# Patient Record
Sex: Female | Born: 1945 | Race: White | Hispanic: No | Smoking: Never smoker
Health system: Southern US, Community
[De-identification: ages and names within clinical notes are randomized; demographics above are authoritative.]

## PROBLEM LIST (undated history)

## (undated) DIAGNOSIS — J302 Other seasonal allergic rhinitis: Secondary | ICD-10-CM

## (undated) DIAGNOSIS — F419 Anxiety disorder, unspecified: Secondary | ICD-10-CM

## (undated) DIAGNOSIS — R011 Cardiac murmur, unspecified: Secondary | ICD-10-CM

## (undated) DIAGNOSIS — E785 Hyperlipidemia, unspecified: Secondary | ICD-10-CM

## (undated) DIAGNOSIS — F329 Major depressive disorder, single episode, unspecified: Secondary | ICD-10-CM

## (undated) DIAGNOSIS — I4510 Unspecified right bundle-branch block: Secondary | ICD-10-CM

## (undated) DIAGNOSIS — C4490 Unspecified malignant neoplasm of skin, unspecified: Secondary | ICD-10-CM

## (undated) DIAGNOSIS — F32A Depression, unspecified: Secondary | ICD-10-CM

## (undated) DIAGNOSIS — C50919 Malignant neoplasm of unspecified site of unspecified female breast: Secondary | ICD-10-CM

## (undated) DIAGNOSIS — Q249 Congenital malformation of heart, unspecified: Secondary | ICD-10-CM

## (undated) DIAGNOSIS — M1990 Unspecified osteoarthritis, unspecified site: Secondary | ICD-10-CM

## (undated) HISTORY — DX: Hyperlipidemia, unspecified: E78.5

## (undated) HISTORY — DX: Unspecified osteoarthritis, unspecified site: M19.90

## (undated) HISTORY — DX: Other seasonal allergic rhinitis: J30.2

## (undated) HISTORY — DX: Depression, unspecified: F32.A

## (undated) HISTORY — DX: Anxiety disorder, unspecified: F41.9

## (undated) HISTORY — DX: Congenital malformation of heart, unspecified: Q24.9

## (undated) HISTORY — DX: Cardiac murmur, unspecified: R01.1

## (undated) HISTORY — PX: BREAST EXCISIONAL BIOPSY: SUR124

## (undated) HISTORY — DX: Major depressive disorder, single episode, unspecified: F32.9

## (undated) HISTORY — DX: Malignant neoplasm of unspecified site of unspecified female breast: C50.919

## (undated) HISTORY — PX: TUBAL LIGATION: SHX77

## (undated) HISTORY — PX: TOOTH EXTRACTION: SUR596

## (undated) HISTORY — DX: Unspecified malignant neoplasm of skin, unspecified: C44.90

## (undated) HISTORY — PX: SKIN SURGERY: SHX2413

---

## 1999-09-27 ENCOUNTER — Encounter (INDEPENDENT_AMBULATORY_CARE_PROVIDER_SITE_OTHER): Payer: Self-pay

## 1999-09-27 ENCOUNTER — Other Ambulatory Visit: Admission: RE | Admit: 1999-09-27 | Discharge: 1999-09-27 | Payer: Self-pay | Admitting: Obstetrics and Gynecology

## 2000-01-24 ENCOUNTER — Encounter: Payer: Self-pay | Admitting: Obstetrics and Gynecology

## 2000-01-24 ENCOUNTER — Ambulatory Visit (HOSPITAL_COMMUNITY): Admission: RE | Admit: 2000-01-24 | Discharge: 2000-01-24 | Payer: Self-pay | Admitting: Obstetrics and Gynecology

## 2000-06-10 ENCOUNTER — Encounter: Payer: Self-pay | Admitting: Family Medicine

## 2000-06-10 ENCOUNTER — Encounter: Admission: RE | Admit: 2000-06-10 | Discharge: 2000-06-10 | Payer: Self-pay | Admitting: Family Medicine

## 2000-06-10 ENCOUNTER — Ambulatory Visit (HOSPITAL_COMMUNITY): Admission: RE | Admit: 2000-06-10 | Discharge: 2000-06-10 | Payer: Self-pay | Admitting: Family Medicine

## 2000-07-15 ENCOUNTER — Other Ambulatory Visit: Admission: RE | Admit: 2000-07-15 | Discharge: 2000-07-15 | Payer: Self-pay | Admitting: Obstetrics and Gynecology

## 2001-07-07 ENCOUNTER — Encounter (INDEPENDENT_AMBULATORY_CARE_PROVIDER_SITE_OTHER): Payer: Self-pay | Admitting: Gastroenterology

## 2001-07-07 HISTORY — PX: COLONOSCOPY: SHX174

## 2001-08-03 ENCOUNTER — Other Ambulatory Visit: Admission: RE | Admit: 2001-08-03 | Discharge: 2001-08-03 | Payer: Self-pay | Admitting: Obstetrics and Gynecology

## 2002-08-04 ENCOUNTER — Other Ambulatory Visit: Admission: RE | Admit: 2002-08-04 | Discharge: 2002-08-04 | Payer: Self-pay | Admitting: Obstetrics and Gynecology

## 2002-12-03 ENCOUNTER — Ambulatory Visit: Admission: RE | Admit: 2002-12-03 | Discharge: 2002-12-03 | Payer: Self-pay | Admitting: Family Medicine

## 2003-08-09 ENCOUNTER — Other Ambulatory Visit: Admission: RE | Admit: 2003-08-09 | Discharge: 2003-08-09 | Payer: Self-pay | Admitting: Obstetrics and Gynecology

## 2003-09-13 ENCOUNTER — Encounter: Admission: RE | Admit: 2003-09-13 | Discharge: 2003-10-21 | Payer: Self-pay | Admitting: Orthopedic Surgery

## 2003-12-01 ENCOUNTER — Encounter: Admission: RE | Admit: 2003-12-01 | Discharge: 2004-02-29 | Payer: Self-pay | Admitting: Family Medicine

## 2004-10-19 ENCOUNTER — Other Ambulatory Visit: Admission: RE | Admit: 2004-10-19 | Discharge: 2004-10-19 | Payer: Self-pay | Admitting: Obstetrics and Gynecology

## 2005-11-07 ENCOUNTER — Other Ambulatory Visit: Admission: RE | Admit: 2005-11-07 | Discharge: 2005-11-07 | Payer: Self-pay | Admitting: Obstetrics and Gynecology

## 2006-11-27 ENCOUNTER — Ambulatory Visit: Payer: Self-pay | Admitting: Cardiovascular Disease

## 2006-12-17 ENCOUNTER — Ambulatory Visit: Payer: Self-pay

## 2006-12-17 ENCOUNTER — Encounter: Payer: Self-pay | Admitting: Cardiology

## 2007-06-16 ENCOUNTER — Ambulatory Visit: Payer: Self-pay | Admitting: Internal Medicine

## 2009-03-01 ENCOUNTER — Encounter: Admission: RE | Admit: 2009-03-01 | Discharge: 2009-03-01 | Payer: Self-pay | Admitting: Obstetrics and Gynecology

## 2009-03-09 ENCOUNTER — Encounter: Admission: RE | Admit: 2009-03-09 | Discharge: 2009-03-09 | Payer: Self-pay | Admitting: Obstetrics and Gynecology

## 2009-05-03 ENCOUNTER — Encounter: Payer: Self-pay | Admitting: Internal Medicine

## 2010-05-17 ENCOUNTER — Encounter: Admission: RE | Admit: 2010-05-17 | Discharge: 2010-05-17 | Payer: Self-pay | Admitting: Obstetrics and Gynecology

## 2010-05-18 ENCOUNTER — Ambulatory Visit: Payer: Self-pay

## 2010-05-18 ENCOUNTER — Ambulatory Visit: Payer: Self-pay | Admitting: Cardiovascular Disease

## 2010-05-18 ENCOUNTER — Encounter: Payer: Self-pay | Admitting: Family Medicine

## 2010-05-18 ENCOUNTER — Ambulatory Visit (HOSPITAL_COMMUNITY): Admission: RE | Admit: 2010-05-18 | Discharge: 2010-05-18 | Payer: Self-pay | Admitting: Family Medicine

## 2010-11-05 ENCOUNTER — Encounter: Payer: Self-pay | Admitting: Obstetrics and Gynecology

## 2010-11-13 NOTE — Letter (Signed)
Summary: Pre Visit No Show Letter  [facility]  [address] [place], [place] [address]   Phone: [phone]  Fax: [phone]        May 03, 2009 MRN: [id number]    KEITHA KOLK [address] [place], [place]  [address]    Dear Ms. Mussa,   We have been unable to reach you by phone concerning the pre-procedure visit that you missed on 05/03/2009. For this reason,your procedure scheduled on 05/17/2009 has been cancelled. Our scheduling staff will gladly assist you with rescheduling your appointments at a more convenient time. Please call our office at [phone] between the hours of 8:00am and 5:00pm, press option #2 to reach an appointment scheduler. Please consider updating your contact numbers at this time so that we can reach you by phone in the future with schedule changes or results.    Thank you,    Wyona Almas RN [facility]

## 2011-02-26 NOTE — Assessment & Plan Note (Signed)
[facility]                         GASTROENTEROLOGY OFFICE NOTE   [name], [name]                     MRN:          [id number]  DATE:06/16/2007                            DOB:          1946/03/10    CHIEF COMPLAINT:  Change in bowels.   ASSESSMENT:  A 65 year old white woman with some looser stools than  normal.  She has had some change in the color from a dark brown to  perhaps a lighter brown color as well.  Colonoscopy six years ago  unrevealing.   RECOMMENDATIONS/PLANS:  Schedule colonoscopy to investigate the change  in bowel habits.   HISTORY:  As above, review otherwise negative.   MEDICATIONS:  None.   DRUG ALLERGIES:  PENICILLIN, TETRACYCLINE.   PAST MEDICAL HISTORY:  Mitral valve prolapse.  Benign breast surgery.  Mitral regurgitation.   FAMILY HISTORY:  No colon cancer.  Father had heart disease.   SOCIAL HISTORY:  She is married.  She is a retired Manufacturing systems engineer.  Some alcohol.  No tobacco or drugs.   REVIEW OF SYSTEMS:  See medical history form.   PHYSICAL EXAMINATION:  Height 5 feet 8.  Weight 175 pounds.  Blood  pressure 118/72, pulse 68.  HEENT:  Eyes are anicteric.  Normal mouth.  NECK:  Supple.  CHEST:  Clear.  HEART:  S1 and S2.  No murmurs, rubs or gallops.  ABDOMEN:  Soft and nontender without organomegaly or mass.  PSYCH:  She is alert and oriented x3.   I appreciate the opportunity to care for this patient.    Iva Boop, MD,FACG  Electronically Signed   CEG/MedQ  DD: 06/16/2007  DT: 06/16/2007  Job #: [id number]   cc:   Kelly George, M.D.

## 2011-03-01 NOTE — Assessment & Plan Note (Signed)
[facility]                            CARDIOLOGY OFFICE NOTE   DAINA, CARA                     MRN:          [id number]  DATE:11/27/2006                            DOB:          [birth year]    Mrs. Steger is a delightful 65 year old patient, referred for an abnormal  stress test.  The patient is a fairly healthy lady, she has not had  significant heart problems in the past.  There is a question of history  of prolapse and murmur.   She has had 2 previous stress tests done up in [place], 1 possibly 3  years ago, and 1 before that.  She does not recall any abnormalities.  She had an episode about 2-3 weeks ago, what sounds like vertigo.  She  had some lightheadedness and dizziness.  She had 1 episode of atypical  chest pain.  It was non exertional, however, it did radiate to the left  shoulder and down her arm.  In general, she is an active lady and does  not get chest pain, PND or orthopnea.  She has not had palpitations or  syncope.   Her cardiac risk factors are really negative.  She does not have  diabetes, she does not smoke, she does have hypertension.  She does not  have family history for coronary disease.   I reviewed her exercise stress tests, which took about 10 minutes.   The patient had a right bundle branch block at baseline.  There was  artifact at peak stress.  I actually do not think the stress test itself  was all that positive.  She had minor T-wave changes in the interior  leads, and no malignant arrhythmias.   Her review of systems is remarkable for occasional headaches and  palpitations.   She has not had a previous reaction to dye.   SHE IS ALLERGIC TO PENICILLIN AND TETRACYCLINE.   She does drink alcohol, wine and beer, on occasion.  She has 2 cups of  coffee a day.  She is happily married for 35 years.  Her husband is an  Psychologist, educational at [employer].  She is originally from [place].  She is at home  most of the time.  She enjoys riding horseback.  She has  a son who has been sick recently with HIV, and a daughter who has 2 kids  who are under 15 years old that she gets to play with quite a bit.  She  has had previous lumpectomy and tubal ligation.   Her mother died at age 40 of emphysema, father died at age 6 of cancer.  She is on no medications.   PHYSICAL EXAMINATION:  Blood pressure is 120/70, pulse is 70 and  regular.  HEENT:  Normal.  There are no carotid bruits, no lymphadenopathy.  No JVP elevation.  Lungs are clear.  There is an S1, S2.  She does have a systolic ejection murmur.  ABDOMEN:  Benign.  LOWER EXTREMITIES:  Intact pulses, no edema.  SKIN:  Warm and dry.  NEURO:  Nonfocal.   IMPRESSION:  Isolated episode of what sounds like vertigo, and 1 episode  of atypical chest pain.  Treadmill test with minor abnormalities, and a  baseline right bundle branch block.   Patient will have a followup adenosine Myoview to rule out significant  coronary artery disease.  She needs a followup test with some sort of  imaging modality, and I think Myoview would be fine.   Patient does have a murmur.  She may have mitral valve prolapse.  I do  not think she needs SBE prophylaxis.  She will have a followup echo to  assess this.  Unless her Myoview or echo are significantly abnormal, she  will followup in [place].   The patient knows to call us if she were to have any more recurrent  chest pain that is more typical sounding of angina.  I would not start  her on any medications at this time.     Noralyn Pick. Eden Emms, MD, [facility]  Electronically Signed    PCN/MedQ  DD: 11/27/2006  DT: 11/27/2006  Job #: [id number]   cc:   Ernestina Penna, M.D.

## 2011-06-15 LAB — HM PAP SMEAR

## 2011-07-08 ENCOUNTER — Other Ambulatory Visit: Payer: Self-pay | Admitting: Obstetrics and Gynecology

## 2011-07-08 DIAGNOSIS — Z780 Asymptomatic menopausal state: Secondary | ICD-10-CM

## 2011-07-08 DIAGNOSIS — Z1231 Encounter for screening mammogram for malignant neoplasm of breast: Secondary | ICD-10-CM

## 2011-07-15 LAB — HM DEXA SCAN

## 2011-08-05 ENCOUNTER — Ambulatory Visit
Admission: RE | Admit: 2011-08-05 | Discharge: 2011-08-05 | Disposition: A | Discharge status: Home / Self Care | Payer: Medicare Other | Source: Ambulatory Visit | Attending: Obstetrics and Gynecology | Admitting: Obstetrics and Gynecology

## 2011-08-05 DIAGNOSIS — Z1231 Encounter for screening mammogram for malignant neoplasm of breast: Secondary | ICD-10-CM

## 2011-08-05 DIAGNOSIS — Z780 Asymptomatic menopausal state: Secondary | ICD-10-CM

## 2011-10-25 DIAGNOSIS — E559 Vitamin D deficiency, unspecified: Secondary | ICD-10-CM | POA: Diagnosis not present

## 2011-10-25 DIAGNOSIS — R5381 Other malaise: Secondary | ICD-10-CM | POA: Diagnosis not present

## 2011-10-25 DIAGNOSIS — E785 Hyperlipidemia, unspecified: Secondary | ICD-10-CM | POA: Diagnosis not present

## 2011-10-30 DIAGNOSIS — E785 Hyperlipidemia, unspecified: Secondary | ICD-10-CM | POA: Diagnosis not present

## 2011-10-30 DIAGNOSIS — I059 Rheumatic mitral valve disease, unspecified: Secondary | ICD-10-CM | POA: Diagnosis not present

## 2011-11-05 ENCOUNTER — Encounter: Payer: Self-pay | Admitting: Internal Medicine

## 2011-11-15 LAB — FECAL OCCULT BLOOD, GUAIAC: Fecal Occult Blood: NEGATIVE

## 2011-11-19 DIAGNOSIS — Z1212 Encounter for screening for malignant neoplasm of rectum: Secondary | ICD-10-CM | POA: Diagnosis not present

## 2011-11-27 ENCOUNTER — Encounter: Payer: PRIVATE HEALTH INSURANCE | Admitting: Internal Medicine

## 2012-02-11 ENCOUNTER — Encounter: Payer: Self-pay | Admitting: Internal Medicine

## 2012-02-11 DIAGNOSIS — E559 Vitamin D deficiency, unspecified: Secondary | ICD-10-CM | POA: Diagnosis not present

## 2012-02-11 DIAGNOSIS — I10 Essential (primary) hypertension: Secondary | ICD-10-CM | POA: Diagnosis not present

## 2012-02-11 DIAGNOSIS — R5381 Other malaise: Secondary | ICD-10-CM | POA: Diagnosis not present

## 2012-02-11 DIAGNOSIS — R5383 Other fatigue: Secondary | ICD-10-CM | POA: Diagnosis not present

## 2012-02-11 DIAGNOSIS — E039 Hypothyroidism, unspecified: Secondary | ICD-10-CM | POA: Diagnosis not present

## 2012-02-11 DIAGNOSIS — E785 Hyperlipidemia, unspecified: Secondary | ICD-10-CM | POA: Diagnosis not present

## 2012-02-11 LAB — HEPATIC FUNCTION PANEL
ALT: 29 U/L (ref 7–35)
AST: 29 U/L (ref 13–35)
Alkaline Phosphatase: 62 U/L (ref 25–125)
Bilirubin, Direct: 0.2 mg/dL
Bilirubin, Total: 0.6 mg/dL

## 2012-02-11 LAB — TSH: TSH: 1.4 u[IU]/mL (ref <=5.90)

## 2012-03-26 DIAGNOSIS — F321 Major depressive disorder, single episode, moderate: Secondary | ICD-10-CM | POA: Diagnosis not present

## 2012-04-01 ENCOUNTER — Ambulatory Visit (AMBULATORY_SURGERY_CENTER): Payer: Medicare Other | Admitting: *Deleted

## 2012-04-01 VITALS — Ht 68.0 in | Wt 179.2 lb

## 2012-04-01 DIAGNOSIS — Z1211 Encounter for screening for malignant neoplasm of colon: Secondary | ICD-10-CM

## 2012-04-01 MED ORDER — MOVIPREP 100 G PO SOLR: ORAL | Status: DC

## 2012-04-15 ENCOUNTER — Encounter: Payer: PRIVATE HEALTH INSURANCE | Admitting: Internal Medicine

## 2012-04-30 DIAGNOSIS — M5430 Sciatica, unspecified side: Secondary | ICD-10-CM | POA: Diagnosis not present

## 2012-04-30 DIAGNOSIS — M999 Biomechanical lesion, unspecified: Secondary | ICD-10-CM | POA: Diagnosis not present

## 2012-05-07 DIAGNOSIS — M5430 Sciatica, unspecified side: Secondary | ICD-10-CM | POA: Diagnosis not present

## 2012-05-07 DIAGNOSIS — M999 Biomechanical lesion, unspecified: Secondary | ICD-10-CM | POA: Diagnosis not present

## 2012-05-12 DIAGNOSIS — M25579 Pain in unspecified ankle and joints of unspecified foot: Secondary | ICD-10-CM | POA: Diagnosis not present

## 2012-05-13 DIAGNOSIS — M5430 Sciatica, unspecified side: Secondary | ICD-10-CM | POA: Diagnosis not present

## 2012-05-13 DIAGNOSIS — M999 Biomechanical lesion, unspecified: Secondary | ICD-10-CM | POA: Diagnosis not present

## 2012-05-15 DIAGNOSIS — Z201 Contact with and (suspected) exposure to tuberculosis: Secondary | ICD-10-CM | POA: Diagnosis not present

## 2012-05-19 DIAGNOSIS — M5430 Sciatica, unspecified side: Secondary | ICD-10-CM | POA: Diagnosis not present

## 2012-05-19 DIAGNOSIS — M999 Biomechanical lesion, unspecified: Secondary | ICD-10-CM | POA: Diagnosis not present

## 2012-05-21 DIAGNOSIS — M5430 Sciatica, unspecified side: Secondary | ICD-10-CM | POA: Diagnosis not present

## 2012-05-21 DIAGNOSIS — M999 Biomechanical lesion, unspecified: Secondary | ICD-10-CM | POA: Diagnosis not present

## 2012-05-27 DIAGNOSIS — M999 Biomechanical lesion, unspecified: Secondary | ICD-10-CM | POA: Diagnosis not present

## 2012-05-27 DIAGNOSIS — M5430 Sciatica, unspecified side: Secondary | ICD-10-CM | POA: Diagnosis not present

## 2012-05-28 DIAGNOSIS — M999 Biomechanical lesion, unspecified: Secondary | ICD-10-CM | POA: Diagnosis not present

## 2012-05-28 DIAGNOSIS — M5430 Sciatica, unspecified side: Secondary | ICD-10-CM | POA: Diagnosis not present

## 2012-06-01 DIAGNOSIS — M999 Biomechanical lesion, unspecified: Secondary | ICD-10-CM | POA: Diagnosis not present

## 2012-06-01 DIAGNOSIS — M5430 Sciatica, unspecified side: Secondary | ICD-10-CM | POA: Diagnosis not present

## 2012-06-03 DIAGNOSIS — R5381 Other malaise: Secondary | ICD-10-CM | POA: Diagnosis not present

## 2012-06-03 DIAGNOSIS — I10 Essential (primary) hypertension: Secondary | ICD-10-CM | POA: Diagnosis not present

## 2012-06-03 DIAGNOSIS — E559 Vitamin D deficiency, unspecified: Secondary | ICD-10-CM | POA: Diagnosis not present

## 2012-06-03 DIAGNOSIS — E785 Hyperlipidemia, unspecified: Secondary | ICD-10-CM | POA: Diagnosis not present

## 2012-06-03 DIAGNOSIS — R5383 Other fatigue: Secondary | ICD-10-CM | POA: Diagnosis not present

## 2012-06-03 LAB — BASIC METABOLIC PANEL
BUN: 16 mg/dL (ref 4–21)
Creatinine: 0.7 mg/dL (ref 0.5–1.1)
Glucose: 97 mg/dL
Potassium: 4.4 mmol/L (ref 3.4–5.3)
Sodium: 142 mmol/L (ref 137–147)

## 2012-06-03 LAB — CBC AND DIFFERENTIAL
HCT: 41 % (ref 36–46)
Hemoglobin: 13.2 g/dL (ref 12.0–16.0)
Platelets: 202 10*3/uL (ref 150–399)
WBC: 3.9 10^3/mL

## 2012-06-03 LAB — LIPID PANEL
Cholesterol: 179 mg/dL (ref 0–200)
HDL: 75 mg/dL — AB (ref 35–70)
LDL Cholesterol: 98 mg/dL
Triglycerides: 32 mg/dL — AB (ref 40–160)

## 2012-06-04 DIAGNOSIS — M5430 Sciatica, unspecified side: Secondary | ICD-10-CM | POA: Diagnosis not present

## 2012-06-04 DIAGNOSIS — M999 Biomechanical lesion, unspecified: Secondary | ICD-10-CM | POA: Diagnosis not present

## 2012-06-08 DIAGNOSIS — E559 Vitamin D deficiency, unspecified: Secondary | ICD-10-CM | POA: Diagnosis not present

## 2012-06-08 DIAGNOSIS — E785 Hyperlipidemia, unspecified: Secondary | ICD-10-CM | POA: Diagnosis not present

## 2012-06-08 DIAGNOSIS — I10 Essential (primary) hypertension: Secondary | ICD-10-CM | POA: Diagnosis not present

## 2012-06-09 DIAGNOSIS — F321 Major depressive disorder, single episode, moderate: Secondary | ICD-10-CM | POA: Diagnosis not present

## 2012-06-14 LAB — HM COLONOSCOPY

## 2012-06-16 DIAGNOSIS — M5430 Sciatica, unspecified side: Secondary | ICD-10-CM | POA: Diagnosis not present

## 2012-06-16 DIAGNOSIS — M999 Biomechanical lesion, unspecified: Secondary | ICD-10-CM | POA: Diagnosis not present

## 2012-07-03 DIAGNOSIS — D485 Neoplasm of uncertain behavior of skin: Secondary | ICD-10-CM | POA: Diagnosis not present

## 2012-07-03 DIAGNOSIS — C44319 Basal cell carcinoma of skin of other parts of face: Secondary | ICD-10-CM | POA: Diagnosis not present

## 2012-07-06 ENCOUNTER — Ambulatory Visit (AMBULATORY_SURGERY_CENTER): Payer: Medicare Other | Admitting: Internal Medicine

## 2012-07-06 ENCOUNTER — Encounter: Payer: Self-pay | Admitting: Internal Medicine

## 2012-07-06 VITALS — BP 142/87 | HR 65 | Temp 97.9°F | Resp 16 | Ht 68.0 in | Wt 179.0 lb

## 2012-07-06 DIAGNOSIS — Z1211 Encounter for screening for malignant neoplasm of colon: Secondary | ICD-10-CM | POA: Diagnosis not present

## 2012-07-06 MED ORDER — SODIUM CHLORIDE 0.9 % IV SOLN: 500.0000 mL | INTRAVENOUS | Status: DC

## 2012-07-06 NOTE — Op Note (Signed)
[facility] [address] [place], [address]   COLONOSCOPY PROCEDURE REPORT  PATIENT: Kelly, George  MR#: [id number] BIRTHDATE: 02/10/1946 , 66  yrs. old GENDER: Female ENDOSCOPIST: Iva Boop, MD, [facility] REFERRED BY: PROCEDURE DATE:  07/06/2012 PROCEDURE:   Colonoscopy, screening ASA CLASS:   Class II INDICATIONS:average risk screening. MEDICATIONS: Propofol (Diprivan) 240 mg IV, MAC sedation, administered by CRNA, and These medications were titrated to patient response per physician's verbal order  DESCRIPTION OF PROCEDURE:   After the risks benefits and alternatives of the procedure were thoroughly explained, informed consent was obtained.  A digital rectal exam revealed no abnormalities of the rectum.   The LB [id number]-[id number]  endoscope was introduced through the anus and advanced to the cecum, which was identified by both the appendix and ileocecal valve. No adverse events experienced.   The quality of the prep was excellent, using MoviPrep  The instrument was then slowly withdrawn as the colon was fully examined.      COLON FINDINGS: A normal appearing cecum, ileocecal valve, and appendiceal orifice were identified.  The ascending, hepatic flexure, transverse, splenic flexure, descending, sigmoid colon and rectum appeared unremarkable.  No polyps or cancers were seen. Retroflexed views revealed no abnormalities. The time to cecum=3 minutes 50 seconds.  Withdrawal time=8 minutes 50 seconds.  The scope was withdrawn and the procedure completed. COMPLICATIONS: There were no complications.  ENDOSCOPIC IMPRESSION: Normal colonoscopy with excellent prep  RECOMMENDATIONS: Repeat colonoscopy 10 years.   eSigned:  Iva Boop, MD, [facility] 07/06/2012 12:23 PM   cc: Rudi Heap, MD and The Patient

## 2012-07-06 NOTE — Patient Instructions (Addendum)
The colonoscopy was normal!  Next routine colonoscopy in about 10 years.  Thank you for choosing me and [facility].  Iva Boop, MD, FACG YOU HAD AN ENDOSCOPIC PROCEDURE TODAY AT THE [facility]: Refer to the procedure report that was given to you for any specific questions about what was found during the examination.  If the procedure report does not answer your questions, please call your gastroenterologist to clarify.  If you requested that your care partner not be given the details of your procedure findings, then the procedure report has been included in a sealed envelope for you to review at your convenience later.  YOU SHOULD EXPECT: Some feelings of bloating in the abdomen. Passage of more gas than usual.  Walking can help get rid of the air that was put into your GI tract during the procedure and reduce the bloating. If you had a lower endoscopy (such as a colonoscopy or flexible sigmoidoscopy) you may notice spotting of blood in your stool or on the toilet paper. If you underwent a bowel prep for your procedure, then you may not have a normal bowel movement for a few days.  DIET: Your first meal following the procedure should be a light meal and then it is ok to progress to your normal diet.  A half-sandwich or bowl of soup is an example of a good first meal.  Heavy or fried foods are harder to digest and may make you feel nauseous or bloated.  Likewise meals heavy in dairy and vegetables can cause extra gas to form and this can also increase the bloating.  Drink plenty of fluids but you should avoid alcoholic beverages for 24 hours.  ACTIVITY: Your care partner should take you home directly after the procedure.  You should plan to take it easy, moving slowly for the rest of the day.  You can resume normal activity the day after the procedure however you should NOT DRIVE or use heavy machinery for 24 hours (because of the sedation medicines used during the  test).    SYMPTOMS TO REPORT IMMEDIATELY: A gastroenterologist can be reached at any hour.  During normal business hours, 8:30 AM to 5:00 PM Monday through Friday, call [phone].  After hours and on weekends, please call the GI answering service at [phone] who will take a message and have the physician on call contact you.   Following lower endoscopy (colonoscopy or flexible sigmoidoscopy):  Excessive amounts of blood in the stool  Significant tenderness or worsening of abdominal pains  Swelling of the abdomen that is new, acute  Fever of 100F or higher FOLLOW UP: If any biopsies were taken you will be contacted by phone or by letter within the next 1-3 weeks.  Call your gastroenterologist if you have not heard about the biopsies in 3 weeks.  Our staff will call the home number listed on your records the next business day following your procedure to check on you and address any questions or concerns that you may have at that time regarding the information given to you following your procedure. This is a courtesy call and so if there is no answer at the home number and we have not heard from you through the emergency physician on call, we will assume that you have returned to your regular daily activities without incident.  SIGNATURES/CONFIDENTIALITY: You and/or your care partner have signed paperwork which will be entered into your electronic medical record.  These signatures attest to  the fact that that the information above on your After Visit Summary has been reviewed and is understood.  Full responsibility of the confidentiality of this discharge information lies with you and/or your care-partner.

## 2012-07-06 NOTE — Progress Notes (Signed)
Propofol given and oxygen managed per Myles Lipps CRNA

## 2012-07-07 ENCOUNTER — Telehealth: Payer: Self-pay | Admitting: *Deleted

## 2012-07-07 NOTE — Telephone Encounter (Signed)
  Follow up Call-  Call back number 07/06/2012  Post procedure Call Back phone  # [phone]  Permission to leave phone message Yes     Patient questions:  Do you have a fever, pain , or abdominal swelling? no Pain Score  0 *  Have you tolerated food without any problems? yes  Have you been able to return to your normal activities? yes  Do you have any questions about your discharge instructions: Diet   no Medications  no Follow up visit  no  Do you have questions or concerns about your Care? no  Actions: * If pain score is 4 or above: No action needed, pain <4.

## 2012-07-15 ENCOUNTER — Other Ambulatory Visit: Payer: Self-pay | Admitting: Family Medicine

## 2012-07-15 DIAGNOSIS — Z803 Family history of malignant neoplasm of breast: Secondary | ICD-10-CM

## 2012-07-15 DIAGNOSIS — Z1231 Encounter for screening mammogram for malignant neoplasm of breast: Secondary | ICD-10-CM

## 2012-08-10 ENCOUNTER — Ambulatory Visit
Admission: RE | Admit: 2012-08-10 | Discharge: 2012-08-10 | Disposition: A | Discharge status: Home / Self Care | Payer: Medicare Other | Source: Ambulatory Visit | Attending: Family Medicine | Admitting: Family Medicine

## 2012-08-10 DIAGNOSIS — Z803 Family history of malignant neoplasm of breast: Secondary | ICD-10-CM

## 2012-08-10 DIAGNOSIS — Z1231 Encounter for screening mammogram for malignant neoplasm of breast: Secondary | ICD-10-CM | POA: Diagnosis not present

## 2012-08-14 LAB — HM MAMMOGRAPHY

## 2012-08-24 DIAGNOSIS — C44319 Basal cell carcinoma of skin of other parts of face: Secondary | ICD-10-CM | POA: Diagnosis not present

## 2012-08-28 DIAGNOSIS — Z23 Encounter for immunization: Secondary | ICD-10-CM | POA: Diagnosis not present

## 2012-09-07 DIAGNOSIS — E785 Hyperlipidemia, unspecified: Secondary | ICD-10-CM | POA: Diagnosis not present

## 2012-09-07 DIAGNOSIS — F411 Generalized anxiety disorder: Secondary | ICD-10-CM | POA: Diagnosis not present

## 2012-12-16 ENCOUNTER — Encounter: Payer: Self-pay | Admitting: *Deleted

## 2012-12-16 DIAGNOSIS — J302 Other seasonal allergic rhinitis: Secondary | ICD-10-CM | POA: Insufficient documentation

## 2012-12-16 DIAGNOSIS — F329 Major depressive disorder, single episode, unspecified: Secondary | ICD-10-CM | POA: Insufficient documentation

## 2012-12-16 DIAGNOSIS — F419 Anxiety disorder, unspecified: Secondary | ICD-10-CM | POA: Insufficient documentation

## 2012-12-16 DIAGNOSIS — E785 Hyperlipidemia, unspecified: Secondary | ICD-10-CM | POA: Insufficient documentation

## 2012-12-16 DIAGNOSIS — F339 Major depressive disorder, recurrent, unspecified: Secondary | ICD-10-CM | POA: Insufficient documentation

## 2012-12-29 ENCOUNTER — Other Ambulatory Visit (INDEPENDENT_AMBULATORY_CARE_PROVIDER_SITE_OTHER): Payer: Medicare Other | Admitting: Family Medicine

## 2012-12-29 DIAGNOSIS — E559 Vitamin D deficiency, unspecified: Secondary | ICD-10-CM | POA: Diagnosis not present

## 2012-12-29 DIAGNOSIS — E785 Hyperlipidemia, unspecified: Secondary | ICD-10-CM

## 2012-12-29 LAB — HEPATIC FUNCTION PANEL
ALT: 39 U/L — ABNORMAL HIGH (ref 0–35)
AST: 35 U/L (ref 0–37)
Albumin: 4.6 g/dL (ref 3.5–5.2)
Alkaline Phosphatase: 80 U/L (ref 39–117)
Bilirubin, Direct: 0.1 mg/dL (ref 0.0–0.3)
Indirect Bilirubin: 0.5 mg/dL (ref 0.0–0.9)
Total Bilirubin: 0.6 mg/dL (ref 0.3–1.2)
Total Protein: 6.9 g/dL (ref 6.0–8.3)

## 2012-12-29 LAB — POCT CBC
Granulocyte percent: 63.7 %G (ref 37–80)
HCT, POC: 43 % (ref 37.7–47.9)
Hemoglobin: 14.1 g/dL (ref 12.2–16.2)
Lymph, poc: 1.3 (ref 0.6–3.4)
MCH, POC: 30.8 pg (ref 27–31.2)
MCHC: 32.8 g/dL (ref 31.8–35.4)
MCV: 93.9 fL (ref 80–97)
MPV: 7.9 fL (ref 0–99.8)
POC Granulocyte: 2.8 (ref 2–6.9)
POC LYMPH PERCENT: 30.4 %L (ref 10–50)
Platelet Count, POC: 241 10*3/uL (ref 142–424)
RBC: 4.6 M/uL (ref 4.04–5.48)
RDW, POC: 13.7 %
WBC: 4.4 10*3/uL — AB (ref 4.6–10.2)

## 2012-12-29 LAB — BASIC METABOLIC PANEL
BUN: 20 mg/dL (ref 6–23)
CO2: 31 mEq/L (ref 19–32)
Calcium: 9.9 mg/dL (ref 8.4–10.5)
Chloride: 100 mEq/L (ref 96–112)
Creat: 0.68 mg/dL (ref 0.50–1.10)
Glucose, Bld: 99 mg/dL (ref 70–99)
Potassium: 4 mEq/L (ref 3.5–5.3)
Sodium: 136 mEq/L (ref 135–145)

## 2012-12-29 NOTE — Progress Notes (Signed)
Patient for labs only

## 2012-12-30 LAB — VITAMIN D 25 HYDROXY (VIT D DEFICIENCY, FRACTURES): Vit D, 25-Hydroxy: 64 ng/mL (ref 30–89)

## 2012-12-30 LAB — NMR, LIPOPROFILE

## 2012-12-30 NOTE — Progress Notes (Signed)
Pt notified of results

## 2012-12-30 NOTE — Progress Notes (Signed)
Left message

## 2013-01-05 ENCOUNTER — Ambulatory Visit (INDEPENDENT_AMBULATORY_CARE_PROVIDER_SITE_OTHER): Payer: Medicare Other

## 2013-01-05 ENCOUNTER — Ambulatory Visit (INDEPENDENT_AMBULATORY_CARE_PROVIDER_SITE_OTHER): Payer: Medicare Other | Admitting: Family Medicine

## 2013-01-05 ENCOUNTER — Encounter: Payer: Self-pay | Admitting: Family Medicine

## 2013-01-05 VITALS — BP 118/64 | HR 65 | Temp 98.2°F | Ht 67.5 in | Wt 175.0 lb

## 2013-01-05 DIAGNOSIS — F329 Major depressive disorder, single episode, unspecified: Secondary | ICD-10-CM | POA: Diagnosis not present

## 2013-01-05 DIAGNOSIS — R300 Dysuria: Secondary | ICD-10-CM

## 2013-01-05 DIAGNOSIS — M25571 Pain in right ankle and joints of right foot: Secondary | ICD-10-CM

## 2013-01-05 DIAGNOSIS — F32A Depression, unspecified: Secondary | ICD-10-CM

## 2013-01-05 DIAGNOSIS — I059 Rheumatic mitral valve disease, unspecified: Secondary | ICD-10-CM | POA: Diagnosis not present

## 2013-01-05 DIAGNOSIS — I341 Nonrheumatic mitral (valve) prolapse: Secondary | ICD-10-CM | POA: Insufficient documentation

## 2013-01-05 DIAGNOSIS — M25579 Pain in unspecified ankle and joints of unspecified foot: Secondary | ICD-10-CM | POA: Diagnosis not present

## 2013-01-05 DIAGNOSIS — F3289 Other specified depressive episodes: Secondary | ICD-10-CM

## 2013-01-05 LAB — POCT UA - MICROSCOPIC ONLY
Bacteria, U Microscopic: NEGATIVE
Casts, Ur, LPF, POC: NEGATIVE
Crystals, Ur, HPF, POC: NEGATIVE
Mucus, UA: NEGATIVE
Yeast, UA: NEGATIVE

## 2013-01-05 LAB — POCT URINALYSIS DIPSTICK
Bilirubin, UA: NEGATIVE
Blood, UA: NEGATIVE
Glucose, UA: NEGATIVE
Ketones, UA: NEGATIVE
Nitrite, UA: NEGATIVE
Protein, UA: NEGATIVE
Spec Grav, UA: <=1.005
Urobilinogen, UA: NEGATIVE
pH, UA: 7.5

## 2013-01-05 MED ORDER — SERTRALINE HCL 50 MG PO TABS: 50.0000 mg | ORAL_TABLET | Freq: Every day | ORAL | Status: DC

## 2013-01-05 NOTE — Progress Notes (Signed)
Subjective:    Patient ID: Kelly George, female    DOB: 06-05-1946, 67 y.o.   MRN: [id number]  HPI . This patient presents for recheck of multiple medical problems.   Patient Active Problem List  Diagnosis  . Seasonal allergies  . Anxiety  . Depression  . Hyperlipidemia  . Mitral valve prolapse syndrome    In addition, she complains of some dysuria which comes and goes. Her back pain is like usual. No significant change in dizziness. There may be some increased discomfort in the right ankle from a injury this past summer. She feels that sometimes it feels like it is going  to give way and she is going to fall. Ibuprofen helps some but the problem continues. There is also some swelling in the top of the foot.  The allergies, current medications, past medical history, surgical history, family and social history are reviewed.  Immunizations reviewed.  Health maintenance reviewed.         Review of Systems  Constitutional: Negative.   HENT: Negative.   Eyes: Negative.   Respiratory: Negative.   Cardiovascular: Negative.   Gastrointestinal: Negative.   Genitourinary: Positive for dysuria (occasional).  Musculoskeletal: Positive for back pain (LBP) and arthralgias (R ankle,daily).  Neurological: Positive for dizziness (positional).       Objective:   Physical Exam  BP 118/64  Pulse 65  Temp(Src) 98.2 F (36.8 C) (Oral)  Ht 5' 7.5" (1.715 m)  Wt 175 lb (79.379 kg)  BMI 26.99 kg/m2  The patient appeared well nourished and normally developed, alert and oriented to time and place. Speech, behavior and judgement appear normal. Vital signs as documented.  Head exam is unremarkable. No scleral icterus or pallor noted.  Neck is without jugular venous distension, thyromegally, or carotid bruits. Carotid upstrokes are brisk bilaterally. No cervical adenopathy. Lungs are clear anteriorly and posteriorly to auscultation. Normal respiratory effort. Cardiac exam reveals  regular rate and rhythm. First and second heart sounds normal. No , rubs or gallops. She has a grade 1/6 systolic ejection murmur. Abdominal exam reveals normal bowl sounds, no masses, no organomegaly and no aortic enlargement. No inguinal adenopathy. No suprapubic tenderness. Extremities are nonedematous and both femoral and pedal pulses are normal. Her dorsal right foot appeared to be slightly swollen and was tender to palpation. Skin without pallor or jaundice.  Warm and dry, without rash. Neurologic exam reveals normal deep tendon reflexes and normal sensation.  [facility] reading (PRIMARY) by  Dr. Rudi Heap. No acute injury pattern noted. There is incidentally a healed spur.                             Results for orders placed in visit on 01/05/13  POCT UA - MICROSCOPIC ONLY      Result Value Range   WBC, Ur, HPF, POC 1-5     RBC, urine, microscopic clear     Bacteria, U Microscopic neg     Mucus, UA neg     Epithelial cells, urine per micros occ     Crystals, Ur, HPF, POC neg     Casts, Ur, LPF, POC neg     Yeast, UA neg    POCT URINALYSIS DIPSTICK      Result Value Range   Color, UA yellow     Clarity, UA clear     Glucose, UA neg     Bilirubin, UA neg     Ketones,  UA neg     Spec Grav, UA <=1.005     Blood, UA neg     pH, UA 7.5     Protein, UA neg     Urobilinogen, UA negative     Nitrite, UA neg     Leukocytes, UA Trace            Assessment & Plan:   1. Dysuria  - POCT UA - Microscopic Only - POCT urinalysis dipstick  2. Ankle pain, right  - DG Ankle Complete Right; Future - Ambulatory referral to Physical Therapy  3. Mitral valve prolapse syndrome   4. Depression

## 2013-01-05 NOTE — Patient Instructions (Addendum)
Continue current meds and therapeutic lifestyle changes FOBT given Will arrange visit for physical therapy for right ankle Drink plenty of fluids

## 2013-01-12 ENCOUNTER — Ambulatory Visit: Payer: Medicare Other | Attending: Family Medicine | Admitting: Physical Therapy

## 2013-01-12 DIAGNOSIS — IMO0001 Reserved for inherently not codable concepts without codable children: Secondary | ICD-10-CM | POA: Diagnosis not present

## 2013-01-12 DIAGNOSIS — R5381 Other malaise: Secondary | ICD-10-CM | POA: Insufficient documentation

## 2013-01-12 DIAGNOSIS — M25579 Pain in unspecified ankle and joints of unspecified foot: Secondary | ICD-10-CM | POA: Diagnosis not present

## 2013-01-15 ENCOUNTER — Ambulatory Visit: Payer: Medicare Other | Admitting: Physical Therapy

## 2013-01-18 ENCOUNTER — Encounter: Payer: Medicare Other | Admitting: Physical Therapy

## 2013-06-09 ENCOUNTER — Telehealth: Payer: Self-pay | Admitting: Family Medicine

## 2013-06-09 ENCOUNTER — Other Ambulatory Visit: Payer: Self-pay | Admitting: *Deleted

## 2013-06-09 MED ORDER — SIMVASTATIN 10 MG PO TABS: 10.0000 mg | ORAL_TABLET | Freq: Every day | ORAL | Status: DC

## 2013-06-16 ENCOUNTER — Other Ambulatory Visit (INDEPENDENT_AMBULATORY_CARE_PROVIDER_SITE_OTHER): Payer: Medicare Other

## 2013-06-16 DIAGNOSIS — Z79899 Other long term (current) drug therapy: Secondary | ICD-10-CM | POA: Diagnosis not present

## 2013-06-16 DIAGNOSIS — E785 Hyperlipidemia, unspecified: Secondary | ICD-10-CM | POA: Diagnosis not present

## 2013-06-16 DIAGNOSIS — R5381 Other malaise: Secondary | ICD-10-CM | POA: Diagnosis not present

## 2013-06-16 DIAGNOSIS — E559 Vitamin D deficiency, unspecified: Secondary | ICD-10-CM | POA: Diagnosis not present

## 2013-06-16 LAB — POCT CBC
Granulocyte percent: 63.2 %G (ref 37–80)
HCT, POC: 39.8 % (ref 37.7–47.9)
Hemoglobin: 13.3 g/dL (ref 12.2–16.2)
Lymph, poc: 1.2 (ref 0.6–3.4)
MCH, POC: 30.8 pg (ref 27–31.2)
MCHC: 33.5 g/dL (ref 31.8–35.4)
MCV: 91.9 fL (ref 80–97)
MPV: 7.7 fL (ref 0–99.8)
POC Granulocyte: 2.7 (ref 2–6.9)
POC LYMPH PERCENT: 28.3 %L (ref 10–50)
Platelet Count, POC: 218 10*3/uL (ref 142–424)
RBC: 4.3 M/uL (ref 4.04–5.48)
RDW, POC: 13.6 %
WBC: 4.2 10*3/uL — AB (ref 4.6–10.2)

## 2013-06-17 ENCOUNTER — Telehealth: Payer: Self-pay | Admitting: Family Medicine

## 2013-06-17 NOTE — Telephone Encounter (Signed)
Patient aware of lab work

## 2013-06-18 LAB — NMR, LIPOPROFILE
Cholesterol: 183 mg/dL (ref <200)
HDL Cholesterol by NMR: 82 mg/dL (ref >=40)
HDL Particle Number: 45.8 umol/L (ref >=30.5)
LDL Particle Number: 1322 nmol/L — ABNORMAL HIGH (ref <1000)
LDL Size: 21.5 nm (ref >20.5)
LDLC SERPL CALC-MCNC: 87 mg/dL (ref <100)
LP-IR Score: <25 (ref <=45)
Small LDL Particle Number: 94 nmol/L (ref <=527)
Triglycerides by NMR: 71 mg/dL (ref <150)

## 2013-06-18 LAB — BMP8+EGFR
BUN/Creatinine Ratio: 40 — ABNORMAL HIGH (ref 11–26)
BUN: 24 mg/dL (ref 8–27)
CO2: 24 mmol/L (ref 18–29)
Calcium: 9.9 mg/dL (ref 8.6–10.2)
Chloride: 103 mmol/L (ref 97–108)
Creatinine, Ser: 0.6 mg/dL (ref 0.57–1.00)
GFR calc Af Amer: 108 mL/min/{1.73_m2} (ref >59)
GFR calc non Af Amer: 93 mL/min/{1.73_m2} (ref >59)
Glucose: 102 mg/dL — ABNORMAL HIGH (ref 65–99)
Potassium: 5.1 mmol/L (ref 3.5–5.2)
Sodium: 141 mmol/L (ref 134–144)

## 2013-06-18 LAB — HEPATIC FUNCTION PANEL
ALT: 22 IU/L (ref 0–32)
AST: 25 IU/L (ref 0–40)
Albumin: 4.6 g/dL (ref 3.6–4.8)
Alkaline Phosphatase: 67 IU/L (ref 39–117)
Bilirubin, Direct: 0.17 mg/dL (ref 0.00–0.40)
Total Bilirubin: 0.5 mg/dL (ref 0.0–1.2)
Total Protein: 6.6 g/dL (ref 6.0–8.5)

## 2013-06-18 LAB — VITAMIN D 25 HYDROXY (VIT D DEFICIENCY, FRACTURES): Vit D, 25-Hydroxy: 46.6 ng/mL (ref 30.0–100.0)

## 2013-06-25 ENCOUNTER — Other Ambulatory Visit: Payer: Self-pay | Admitting: Family Medicine

## 2013-06-28 MED ORDER — ALPRAZOLAM 0.25 MG PO TABS: 0.2500 mg | ORAL_TABLET | Freq: Every evening | ORAL | Status: DC | PRN

## 2013-06-28 NOTE — Telephone Encounter (Signed)
This is okay for 6 months please call in to [facility]

## 2013-06-28 NOTE — Telephone Encounter (Signed)
Not on epic med list, last filled in 2012 per paper chart. Has appt this month. If approved route back to pool A to be called into [facility]

## 2013-06-29 NOTE — Telephone Encounter (Signed)
Rx  Called into the pharmacy  

## 2013-06-30 ENCOUNTER — Other Ambulatory Visit: Payer: Medicare Other

## 2013-07-02 DIAGNOSIS — S0003XA Contusion of scalp, initial encounter: Secondary | ICD-10-CM | POA: Diagnosis not present

## 2013-07-02 DIAGNOSIS — S40029A Contusion of unspecified upper arm, initial encounter: Secondary | ICD-10-CM | POA: Diagnosis not present

## 2013-07-02 DIAGNOSIS — S139XXA Sprain of joints and ligaments of unspecified parts of neck, initial encounter: Secondary | ICD-10-CM | POA: Diagnosis not present

## 2013-07-02 DIAGNOSIS — S0990XA Unspecified injury of head, initial encounter: Secondary | ICD-10-CM | POA: Diagnosis not present

## 2013-07-02 DIAGNOSIS — M542 Cervicalgia: Secondary | ICD-10-CM | POA: Diagnosis not present

## 2013-07-02 DIAGNOSIS — S0083XA Contusion of other part of head, initial encounter: Secondary | ICD-10-CM | POA: Diagnosis not present

## 2013-07-02 DIAGNOSIS — S4980XA Other specified injuries of shoulder and upper arm, unspecified arm, initial encounter: Secondary | ICD-10-CM | POA: Diagnosis not present

## 2013-07-08 ENCOUNTER — Encounter: Payer: Self-pay | Admitting: Family Medicine

## 2013-07-08 ENCOUNTER — Ambulatory Visit (INDEPENDENT_AMBULATORY_CARE_PROVIDER_SITE_OTHER): Payer: Medicare Other | Admitting: Family Medicine

## 2013-07-08 VITALS — BP 129/82 | HR 70 | Temp 98.5°F | Ht 67.5 in | Wt 180.0 lb

## 2013-07-08 DIAGNOSIS — F419 Anxiety disorder, unspecified: Secondary | ICD-10-CM

## 2013-07-08 DIAGNOSIS — F411 Generalized anxiety disorder: Secondary | ICD-10-CM | POA: Diagnosis not present

## 2013-07-08 DIAGNOSIS — Z23 Encounter for immunization: Secondary | ICD-10-CM | POA: Diagnosis not present

## 2013-07-08 DIAGNOSIS — Z5189 Encounter for other specified aftercare: Secondary | ICD-10-CM

## 2013-07-08 DIAGNOSIS — E559 Vitamin D deficiency, unspecified: Secondary | ICD-10-CM | POA: Diagnosis not present

## 2013-07-08 DIAGNOSIS — F329 Major depressive disorder, single episode, unspecified: Secondary | ICD-10-CM | POA: Diagnosis not present

## 2013-07-08 DIAGNOSIS — E785 Hyperlipidemia, unspecified: Secondary | ICD-10-CM | POA: Diagnosis not present

## 2013-07-08 DIAGNOSIS — F32A Depression, unspecified: Secondary | ICD-10-CM

## 2013-07-08 DIAGNOSIS — S40011D Contusion of right shoulder, subsequent encounter: Secondary | ICD-10-CM

## 2013-07-08 MED ORDER — SERTRALINE HCL 50 MG PO TABS: 50.0000 mg | ORAL_TABLET | Freq: Every day | ORAL | Status: DC

## 2013-07-08 NOTE — Progress Notes (Addendum)
Subjective:    Patient ID: Kelly George, female    DOB: [birth year], 67 y.o.   MRN: [id number]  HPI Pt here for follow up and management of chronic medical problems. Patient had a recent fall while hiking in [place]. She hit her head and sustained a contusion of her right shoulder. She did have a CT scan of her head and this was reported as negative. In regard to this injury she is doing well and maybe still has a slight headache.   Patient Active Problem List   Diagnosis Date Noted  . Mitral valve prolapse syndrome 01/05/2013  . Seasonal allergies   . Anxiety   . Depression   . Hyperlipidemia    Outpatient Encounter Prescriptions as of 07/08/2013  Medication Sig Dispense Refill  . ALPRAZolam (XANAX) 0.25 MG tablet Take 1 tablet (0.25 mg total) by mouth at bedtime as needed for sleep.  30 tablet  1  . calcium-vitamin D (OSCAL WITH D) 500-200 MG-UNIT per tablet Take 1 tablet by mouth daily.      . Cholecalciferol (VITAMIN D3) 2000 UNITS TABS Take 1 tablet by mouth daily.      . fish oil-omega-3 fatty acids 1000 MG capsule Take 2 g by mouth daily.      Marland Kitchen glucosamine-chondroitin 500-400 MG tablet Take 1 tablet by mouth 2 (two) times daily.       . Multiple Vitamins-Minerals (MULTIVITAMIN WITH MINERALS) tablet Take 1 tablet by mouth daily.      . sertraline (ZOLOFT) 50 MG tablet Take 1 tablet (50 mg total) by mouth daily.  30 tablet  5  . simvastatin (ZOCOR) 10 MG tablet Take 1 tablet (10 mg total) by mouth at bedtime.  90 tablet  1  . [DISCONTINUED] cholecalciferol (VITAMIN D) 400 UNITS TABS Take 2,000 Units by mouth.        No facility-administered encounter medications on file as of 07/08/2013.       Review of Systems  Constitutional: Negative.   HENT: Positive for postnasal drip.   Eyes: Negative.   Respiratory: Negative.   Cardiovascular: Negative.   Gastrointestinal: Negative.   Endocrine: Negative.   Genitourinary: Negative.   Musculoskeletal: Negative.   Skin:  Negative.   Allergic/Immunologic: Negative.   Neurological: Positive for light-headedness. Negative for headaches.       Fall- hit head on rock 1 week ago in [place]- had CT (wnl)  Hematological: Negative.   Psychiatric/Behavioral: Negative.        Objective:   Physical Exam  Nursing note and vitals reviewed. Constitutional: She is oriented to person, place, and time. She appears well-developed and well-nourished. No distress.  HENT:  Head: Normocephalic and atraumatic.  Right Ear: External ear normal.  Left Ear: External ear normal.  Nose: Nose normal.  Mouth/Throat: Oropharynx is clear and moist. No oropharyngeal exudate.  Minimal nasal congestion bilaterally  Eyes: Conjunctivae and EOM are normal. Pupils are equal, round, and reactive to light. Right eye exhibits no discharge. Left eye exhibits no discharge. No scleral icterus.  Neck: Normal range of motion. Neck supple. No JVD present. No thyromegaly present.  Cardiovascular: Normal rate, regular rhythm and intact distal pulses.  Exam reveals no gallop and no friction rub.   Murmur: patient has grade 2/6 systolic ejection murmur. She is a history of mitral valve prolapse. There is no history of aortic stenosis. 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 At 72 per minute  Pulmonary/Chest: Effort  normal and breath sounds normal. No respiratory distress. She has no wheezes. She has no rales. She exhibits no tenderness.  Abdominal: Soft. Bowel sounds are normal. She exhibits no mass. There is no tenderness. There is no rebound and no guarding.  Musculoskeletal: Normal range of motion. She exhibits no edema and no tenderness.  Resolving contusion right shoulder and upper arm  good movement without pain  Lymphadenopathy:    She has no cervical adenopathy.  Neurological: She is alert and oriented to person, place, and time. She has normal reflexes. No cranial nerve deficit.  Skin: Skin is warm  and dry. No rash noted.  Psychiatric: She has a normal mood and affect. Her behavior is normal. Judgment and thought content normal.   BP 129/82  Pulse 70  Temp(Src) 98.5 F (36.9 C) (Oral)  Ht 5' 7.5" (1.715 m)  Wt 180 lb (81.647 kg)  BMI 27.76 kg/m2        Assessment & Plan:   1. Vitamin D deficiency   2. Hyperlipidemia   3. Depression   4. Anxiety, stable   5. Contusion of right shoulder, subsequent encounter    No orders of the defined types were placed in this encounter.   Meds ordered this encounter  Medications  . sertraline (ZOLOFT) 50 MG tablet    Sig: Take 1 tablet (50 mg total) by mouth daily.    Dispense:  30 tablet    Refill:  5   Patient Instructions  Don't forget to get your flu shot Consider getting Prevnar with the flu shot Get your mammogram, pelvic exam, and DEXA scan as planned Try to be more aggressive with therapeutic lifestyle changes especially dieting Continue medication is doing Drink plenty of fluids Be careful and don't put yourself at risk for falling    Nyra Capes MD

## 2013-07-08 NOTE — Patient Instructions (Signed)
Don't forget to get your flu shot Consider getting Prevnar with the flu shot Get your mammogram, pelvic exam, and DEXA scan as planned Try to be more aggressive with therapeutic lifestyle changes especially dieting Continue medication is doing Drink plenty of fluids Be careful and don't put yourself at risk for falling

## 2013-07-09 ENCOUNTER — Other Ambulatory Visit (INDEPENDENT_AMBULATORY_CARE_PROVIDER_SITE_OTHER): Payer: Medicare Other

## 2013-07-09 DIAGNOSIS — Z1212 Encounter for screening for malignant neoplasm of rectum: Secondary | ICD-10-CM

## 2013-07-10 DIAGNOSIS — Z1212 Encounter for screening for malignant neoplasm of rectum: Secondary | ICD-10-CM | POA: Diagnosis not present

## 2013-07-12 ENCOUNTER — Encounter: Payer: Self-pay | Admitting: *Deleted

## 2013-07-12 LAB — FECAL OCCULT BLOOD, IMMUNOCHEMICAL: Fecal Occult Bld: NEGATIVE

## 2013-07-22 ENCOUNTER — Other Ambulatory Visit: Payer: Self-pay

## 2013-07-22 DIAGNOSIS — Z1231 Encounter for screening mammogram for malignant neoplasm of breast: Secondary | ICD-10-CM

## 2013-07-22 DIAGNOSIS — Z803 Family history of malignant neoplasm of breast: Secondary | ICD-10-CM

## 2013-08-23 ENCOUNTER — Ambulatory Visit: Payer: Medicare Other

## 2013-08-30 DIAGNOSIS — D235 Other benign neoplasm of skin of trunk: Secondary | ICD-10-CM | POA: Diagnosis not present

## 2013-08-30 DIAGNOSIS — L819 Disorder of pigmentation, unspecified: Secondary | ICD-10-CM | POA: Diagnosis not present

## 2013-08-30 DIAGNOSIS — Z85828 Personal history of other malignant neoplasm of skin: Secondary | ICD-10-CM | POA: Diagnosis not present

## 2013-09-14 ENCOUNTER — Ambulatory Visit
Admission: RE | Admit: 2013-09-14 | Discharge: 2013-09-14 | Disposition: A | Discharge status: Home / Self Care | Payer: Medicare Other | Source: Ambulatory Visit

## 2013-09-14 DIAGNOSIS — Z1231 Encounter for screening mammogram for malignant neoplasm of breast: Secondary | ICD-10-CM | POA: Diagnosis not present

## 2013-09-14 DIAGNOSIS — Z803 Family history of malignant neoplasm of breast: Secondary | ICD-10-CM

## 2013-09-20 ENCOUNTER — Encounter: Payer: Self-pay | Admitting: Podiatry

## 2013-09-20 ENCOUNTER — Ambulatory Visit (INDEPENDENT_AMBULATORY_CARE_PROVIDER_SITE_OTHER): Payer: Medicare Other | Admitting: Podiatry

## 2013-09-20 ENCOUNTER — Ambulatory Visit (INDEPENDENT_AMBULATORY_CARE_PROVIDER_SITE_OTHER): Payer: Medicare Other

## 2013-09-20 VITALS — BP 142/83 | HR 59 | Resp 16 | Ht 68.0 in | Wt 175.0 lb

## 2013-09-20 DIAGNOSIS — M7750 Other enthesopathy of unspecified foot: Secondary | ICD-10-CM | POA: Diagnosis not present

## 2013-09-20 DIAGNOSIS — M19079 Primary osteoarthritis, unspecified ankle and foot: Secondary | ICD-10-CM | POA: Diagnosis not present

## 2013-09-20 DIAGNOSIS — M79671 Pain in right foot: Secondary | ICD-10-CM

## 2013-09-20 DIAGNOSIS — M25579 Pain in unspecified ankle and joints of unspecified foot: Secondary | ICD-10-CM

## 2013-09-20 DIAGNOSIS — M25571 Pain in right ankle and joints of right foot: Secondary | ICD-10-CM

## 2013-09-20 DIAGNOSIS — M79609 Pain in unspecified limb: Secondary | ICD-10-CM | POA: Diagnosis not present

## 2013-09-20 MED ORDER — TRIAMCINOLONE ACETONIDE 10 MG/ML IJ SUSP
10.0000 mg | Freq: Once | INTRAMUSCULAR | Status: AC
  Administered 2013-09-20: 10 mg

## 2013-09-20 NOTE — Progress Notes (Signed)
   Subjective:    Patient ID: Kelly George, female    DOB: October 29, 1945, 67 y.o.   MRN: [id number]  HPI Comments: "My right foot hurts"  Pt c/o lateral, medial and anterior ankle right. Some dorsal/lateral foot right. Feels real weak at times and gives away when walking. Its been swollen. Doesn't remember an injury. Pain is real sharp. Has been taking Aleve or Ibuprofen with some relief.     Review of Systems  Musculoskeletal: Positive for arthralgias and gait problem.  All other systems reviewed and are negative.       Objective:   Physical Exam        Assessment & Plan:

## 2013-09-20 NOTE — Progress Notes (Signed)
Subjective:     Patient ID: Manus Rudd, female   DOB: [birth year], 67 y.o.   MRN: [id number]  Foot Pain   patient presents stating I'm having a lot of pain on top of my foot for the last year and at times it feels like it is giving way   Review of Systems  All other systems reviewed and are negative.       Objective:   Physical Exam  Nursing note and vitals reviewed. Constitutional: She is oriented to person, place, and time.  Cardiovascular: Intact distal pulses.   Musculoskeletal: Normal range of motion.  Neurological: She is oriented to person, place, and time.  Skin: Skin is warm.   patient has pain on the dorsum of the right foot and ankle around the talonavicular and ankle joint. 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 Injection administered into the talonavicular and ankle joint right to reduce inflammation and if symptoms persist or ligamentous laxity work to be noted she is to reappoint her to check

## 2013-11-15 ENCOUNTER — Ambulatory Visit (INDEPENDENT_AMBULATORY_CARE_PROVIDER_SITE_OTHER): Payer: Medicare Other | Admitting: Podiatry

## 2013-11-15 ENCOUNTER — Encounter: Payer: Self-pay | Admitting: Podiatry

## 2013-11-15 VITALS — BP 156/87 | HR 69 | Resp 12

## 2013-11-15 DIAGNOSIS — M7750 Other enthesopathy of unspecified foot: Secondary | ICD-10-CM

## 2013-11-15 NOTE — Progress Notes (Signed)
Subjective:     Patient ID: Kelly George, female   DOB: [birth year], 68 y.o.   MRN: [id number]  HPI patient presents stating my foot feels about 60% better but I still be pain when I spent a lot of time on   Review of Systems     Objective:   Physical Exam Neurovascular status intact with continued discomfort in the right ankle and around the talonavicular joint right with some collapse of the arch noted    Assessment:     Chronic arthritis with tendinitis type symptoms and structural changes noted    Plan:     Advised on physical therapy and scanned for custom orthotics to lift the plantar arch and take pressure off the tendons. Injection can be done at one point in future

## 2013-11-17 DIAGNOSIS — Z0000 Encounter for general adult medical examination without abnormal findings: Secondary | ICD-10-CM | POA: Diagnosis not present

## 2013-11-17 DIAGNOSIS — Z01419 Encounter for gynecological examination (general) (routine) without abnormal findings: Secondary | ICD-10-CM | POA: Diagnosis not present

## 2013-11-17 DIAGNOSIS — Z124 Encounter for screening for malignant neoplasm of cervix: Secondary | ICD-10-CM | POA: Diagnosis not present

## 2013-12-24 ENCOUNTER — Ambulatory Visit (INDEPENDENT_AMBULATORY_CARE_PROVIDER_SITE_OTHER): Payer: Medicare Other | Admitting: Podiatry

## 2013-12-24 DIAGNOSIS — M7750 Other enthesopathy of unspecified foot: Secondary | ICD-10-CM

## 2013-12-24 NOTE — Progress Notes (Signed)
Pt presents for orthotic pick up written and oral instructions given.  Orthotics are dispensed and inserted into pt shoes, pt walked the hall stating that "They feel pretty good." Pt is encouraged to set up a 1 month appt for follow up or sooner if needed.

## 2013-12-24 NOTE — Progress Notes (Signed)
Subjective:     Patient ID: Kelly George, female   DOB: 15-Jul-1946, 68 y.o.   MRN: [id number]  HPI patient presents with foot structure issues of both feet   Review of Systems     Objective:   Physical Exam Neurovascular status unchanged with pain    Assessment:     Tendinitis    Plan:     Orthotics dispensed by staff fitted well and was given instructions on how to gradually break the orthotics in

## 2013-12-24 NOTE — Patient Instructions (Signed)

## 2013-12-28 ENCOUNTER — Other Ambulatory Visit: Payer: Self-pay | Admitting: Family Medicine

## 2014-01-03 ENCOUNTER — Other Ambulatory Visit (INDEPENDENT_AMBULATORY_CARE_PROVIDER_SITE_OTHER): Payer: Medicare Other

## 2014-01-03 DIAGNOSIS — E785 Hyperlipidemia, unspecified: Secondary | ICD-10-CM

## 2014-01-03 DIAGNOSIS — E559 Vitamin D deficiency, unspecified: Secondary | ICD-10-CM | POA: Diagnosis not present

## 2014-01-03 DIAGNOSIS — I10 Essential (primary) hypertension: Secondary | ICD-10-CM | POA: Diagnosis not present

## 2014-01-03 LAB — POCT CBC
Granulocyte percent: 60.4 %G (ref 37–80)
HCT, POC: 41.8 % (ref 37.7–47.9)
Hemoglobin: 13.4 g/dL (ref 12.2–16.2)
Lymph, poc: 1.3 (ref 0.6–3.4)
MCH, POC: 30.8 pg (ref 27–31.2)
MCHC: 32.2 g/dL (ref 31.8–35.4)
MCV: 95.7 fL (ref 80–97)
MPV: 8.2 fL (ref 0–99.8)
POC Granulocyte: 2.3 (ref 2–6.9)
POC LYMPH PERCENT: 35 %L (ref 10–50)
Platelet Count, POC: 218 10*3/uL (ref 142–424)
RBC: 4.4 M/uL (ref 4.04–5.48)
RDW, POC: 13 %
WBC: 3.8 10*3/uL — AB (ref 4.6–10.2)

## 2014-01-03 NOTE — Progress Notes (Signed)
Pt here for labs only. 

## 2014-01-04 LAB — BMP8+EGFR
BUN/Creatinine Ratio: 27 — ABNORMAL HIGH (ref 11–26)
BUN: 18 mg/dL (ref 8–27)
CO2: 24 mmol/L (ref 18–29)
Calcium: 9.2 mg/dL (ref 8.7–10.3)
Chloride: 103 mmol/L (ref 97–108)
Creatinine, Ser: 0.67 mg/dL (ref 0.57–1.00)
GFR calc Af Amer: 104 mL/min/{1.73_m2} (ref >59)
GFR calc non Af Amer: 90 mL/min/{1.73_m2} (ref >59)
Glucose: 94 mg/dL (ref 65–99)
Potassium: 4.2 mmol/L (ref 3.5–5.2)
Sodium: 142 mmol/L (ref 134–144)

## 2014-01-04 LAB — HEPATIC FUNCTION PANEL
ALT: 20 IU/L (ref 0–32)
AST: 23 IU/L (ref 0–40)
Albumin: 4.6 g/dL (ref 3.6–4.8)
Alkaline Phosphatase: 66 IU/L (ref 39–117)
Bilirubin, Direct: 0.19 mg/dL (ref 0.00–0.40)
Total Bilirubin: 0.6 mg/dL (ref 0.0–1.2)
Total Protein: 6.3 g/dL (ref 6.0–8.5)

## 2014-01-04 LAB — NMR, LIPOPROFILE
Cholesterol: 171 mg/dL (ref <200)
HDL Cholesterol by NMR: 78 mg/dL (ref >=40)
HDL Particle Number: 42.4 umol/L (ref >=30.5)
LDL Particle Number: 922 nmol/L (ref <1000)
LDL Size: 21.2 nm (ref >20.5)
LDLC SERPL CALC-MCNC: 80 mg/dL (ref <100)
LP-IR Score: <25 (ref <=45)
Small LDL Particle Number: <90 nmol/L (ref <=527)
Triglycerides by NMR: 65 mg/dL (ref <150)

## 2014-01-04 LAB — VITAMIN D 25 HYDROXY (VIT D DEFICIENCY, FRACTURES): Vit D, 25-Hydroxy: 37.8 ng/mL (ref 30.0–100.0)

## 2014-01-05 ENCOUNTER — Ambulatory Visit: Payer: Medicare Other | Admitting: Family Medicine

## 2014-01-05 ENCOUNTER — Encounter: Payer: Self-pay | Admitting: Family Medicine

## 2014-01-05 ENCOUNTER — Ambulatory Visit (INDEPENDENT_AMBULATORY_CARE_PROVIDER_SITE_OTHER): Payer: Medicare Other | Admitting: Family Medicine

## 2014-01-05 VITALS — BP 133/83 | HR 63 | Temp 98.7°F | Ht 68.0 in | Wt 178.0 lb

## 2014-01-05 DIAGNOSIS — E785 Hyperlipidemia, unspecified: Secondary | ICD-10-CM

## 2014-01-05 DIAGNOSIS — E559 Vitamin D deficiency, unspecified: Secondary | ICD-10-CM | POA: Diagnosis not present

## 2014-01-05 DIAGNOSIS — Z23 Encounter for immunization: Secondary | ICD-10-CM | POA: Diagnosis not present

## 2014-01-05 DIAGNOSIS — R42 Dizziness and giddiness: Secondary | ICD-10-CM | POA: Diagnosis not present

## 2014-01-05 DIAGNOSIS — I059 Rheumatic mitral valve disease, unspecified: Secondary | ICD-10-CM | POA: Diagnosis not present

## 2014-01-05 NOTE — Progress Notes (Signed)
Subjective:    Patient ID: Kelly George, female    DOB: [birth year], 68 y.o.   MRN: [id number]  HPI Pt here for follow up and management of chronic medical problems. Labs were reviewed with patient today and her vitamin D level was low and this was increased as per direction to patient       Patient Active Problem List   Diagnosis Date Noted  . Vitamin D deficiency 07/08/2013  . Mitral valve prolapse syndrome, history of mild 01/05/2013  . Seasonal allergies   . Anxiety   . Depression   . Hyperlipidemia    Outpatient Encounter Prescriptions as of 01/05/2014  Medication Sig  . ALPRAZolam (XANAX) 0.25 MG tablet Take 1 tablet (0.25 mg total) by mouth at bedtime as needed for sleep.  . calcium-vitamin D (OSCAL WITH D) 500-200 MG-UNIT per tablet Take 1 tablet by mouth daily.  . Cholecalciferol (VITAMIN D3) 2000 UNITS TABS Take 1 tablet by mouth daily.  . fish oil-omega-3 fatty acids 1000 MG capsule Take 2 g by mouth daily.  Marland Kitchen glucosamine-chondroitin 500-400 MG tablet Take 1 tablet by mouth 2 (two) times daily.   . Multiple Vitamins-Minerals (MULTIVITAMIN WITH MINERALS) tablet Take 1 tablet by mouth daily.  . sertraline (ZOLOFT) 50 MG tablet Take 1 tablet (50 mg total) by mouth daily.  . simvastatin (ZOCOR) 10 MG tablet TAKE ONE TABLET BY MOUTH AT BEDTIME    Review of Systems  Constitutional: Negative.   HENT: Negative.   Eyes: Negative.   Respiratory: Negative.   Cardiovascular: Negative.   Gastrointestinal: Negative.   Endocrine: Negative.   Genitourinary: Negative.   Musculoskeletal: Positive for arthralgias (right foot pain- [facility]- got orthotics for shoes 2 weeks ago).  Skin: Negative.   Allergic/Immunologic: Negative.   Neurological: Positive for dizziness (off balance at times).  Hematological: Negative.   Psychiatric/Behavioral: Negative.        Objective:   Physical Exam  Nursing note and vitals reviewed. Constitutional: She is  oriented to person, place, and time. She appears well-developed and well-nourished. No distress.  HENT:  Head: Normocephalic and atraumatic.  Right Ear: External ear normal.  Left Ear: External ear normal.  Nose: Nose normal.  Mouth/Throat: Oropharynx is clear and moist. No oropharyngeal exudate.  Eyes: Conjunctivae and EOM are normal. Pupils are equal, round, and reactive to light. Right eye exhibits no discharge. Left eye exhibits no discharge. No scleral icterus.  Neck: Normal range of motion. Neck supple. No thyromegaly present.  Cardiovascular: Normal rate, regular rhythm and intact distal pulses.  Exam reveals no gallop and no friction rub.   Murmur heard. At 72 per minute with a grade 1/6 systolic ejection murmur  Pulmonary/Chest: Effort normal and breath sounds normal. No respiratory distress. She has no wheezes. She has no rales. She exhibits no tenderness.  Abdominal: Soft. Bowel sounds are normal. She exhibits no mass. There is no tenderness. There is no rebound and no guarding.  Musculoskeletal: Normal range of motion. She exhibits tenderness. She exhibits no edema.  Slight tenderness right dorsal foot at ankle joint  Lymphadenopathy:    She has no cervical adenopathy.  Neurological: She is alert and oriented to person, place, and time.  Skin: Skin is warm and dry.  Psychiatric: She has a normal mood and affect. Her behavior is normal. Judgment and thought content normal.   BP 133/83  Pulse 63  Temp(Src) 98.7 F (37.1 C) (Oral)  Ht 5\' 8"  (1.727  m)  Wt 178 lb (80.74 kg)  BMI 27.07 kg/m2        Assessment & Plan:   1. Hyperlipidemia  2. Vitamin D deficiency  3. Dizziness and giddiness  4. Mitral valve disorders  Patient Instructions                       Medicare Annual Wellness Visit  [facility] and the medical providers at [facility] strive to bring you the best medical care.  In doing so we not only want to address your current  medical conditions and concerns but also to detect new conditions early and prevent illness, disease and health-related problems.    Medicare offers a yearly Wellness Visit which allows our clinical staff to assess your need for preventative services including immunizations, lifestyle education, counseling to decrease risk of preventable diseases and screening for fall risk and other medical concerns.    This visit is provided free of charge (no copay) for all Medicare recipients. The clinical pharmacists at [facility] have begun to conduct these Wellness Visits which will also include a thorough review of all your medications.    As you primary medical provider recommend that you make an appointment for your Annual Wellness Visit if you have not done so already this year.  You may set up this appointment before you leave today or you may call back [phone]) and schedule an appointment.  Please make sure when you call that you mention that you are scheduling your Annual Wellness Visit with the clinical pharmacist so that the appointment may be made for the proper length of time.      Continue current medications. Continue good therapeutic lifestyle changes which include good diet and exercise. Fall precautions discussed with patient. If an FOBT was given today- please return it to our front desk. If you are over 65 years old - you may need Prevnar 31 or the adult Pneumonia vaccine.  Drink plenty of fluids Try meclizine over-the-counter if needed for dizziness If you develop any significant allergic rhinitis, try Flonase or Nasacort nose spray Increase vitamin D3 2000 to 2000 Monday through Friday and 4000 on Saturday and Sunday   Arrie Senate MD

## 2014-01-05 NOTE — Patient Instructions (Addendum)
Medicare Annual Wellness Visit  [facility] and the medical providers at [facility] strive to bring you the best medical care.  In doing so we not only want to address your current medical conditions and concerns but also to detect new conditions early and prevent illness, disease and health-related problems.    Medicare offers a yearly Wellness Visit which allows our clinical staff to assess your need for preventative services including immunizations, lifestyle education, counseling to decrease risk of preventable diseases and screening for fall risk and other medical concerns.    This visit is provided free of charge (no copay) for all Medicare recipients. The clinical pharmacists at [facility] have begun to conduct these Wellness Visits which will also include a thorough review of all your medications.    As you primary medical provider recommend that you make an appointment for your Annual Wellness Visit if you have not done so already this year.  You may set up this appointment before you leave today or you may call back [phone]) and schedule an appointment.  Please make sure when you call that you mention that you are scheduling your Annual Wellness Visit with the clinical pharmacist so that the appointment may be made for the proper length of time.      Continue current medications. Continue good therapeutic lifestyle changes which include good diet and exercise. Fall precautions discussed with patient. If an FOBT was given today- please return it to our front desk. If you are over 74 years old - you may need Prevnar 40 or the adult Pneumonia vaccine.  Drink plenty of fluids Try meclizine over-the-counter if needed for dizziness If you develop any significant allergic rhinitis, try Flonase or Nasacort nose spray Increase vitamin D3 2000 to 2000 Monday through Friday and 4000 on Saturday and Sunday

## 2014-01-08 ENCOUNTER — Other Ambulatory Visit: Payer: Self-pay | Admitting: Family Medicine

## 2014-01-27 ENCOUNTER — Encounter: Payer: Self-pay | Admitting: Podiatry

## 2014-01-27 ENCOUNTER — Ambulatory Visit (INDEPENDENT_AMBULATORY_CARE_PROVIDER_SITE_OTHER): Payer: Medicare Other | Admitting: Podiatry

## 2014-01-27 VITALS — BP 142/82 | HR 70 | Resp 12

## 2014-01-27 DIAGNOSIS — M7750 Other enthesopathy of unspecified foot: Secondary | ICD-10-CM | POA: Diagnosis not present

## 2014-01-28 NOTE — Progress Notes (Signed)
Subjective:     Patient ID: Kelly George, female   DOB: 12/03/1945, 68 y.o.   MRN: [id number]  HPI patient states my foot is feeling better and walking with a better gait pattern right foot  Review of Systems     Objective:   Physical Exam Neurovascular status unchanged with patient well oriented x3 and pain dorsal and lateral aspect of the right foot in the tendon complex    Assessment:     Tendinitis of the extensor tendon right foot with inflammation noted    Plan:     H&P performed and discussed physical therapy and supportive shoe gear usage. Patient will be seen back for his to recheck

## 2014-02-22 DIAGNOSIS — H269 Unspecified cataract: Secondary | ICD-10-CM | POA: Diagnosis not present

## 2014-03-09 DIAGNOSIS — M19079 Primary osteoarthritis, unspecified ankle and foot: Secondary | ICD-10-CM | POA: Diagnosis not present

## 2014-03-09 DIAGNOSIS — S93609A Unspecified sprain of unspecified foot, initial encounter: Secondary | ICD-10-CM | POA: Diagnosis not present

## 2014-04-06 ENCOUNTER — Other Ambulatory Visit: Payer: Self-pay | Admitting: Family Medicine

## 2014-05-10 ENCOUNTER — Other Ambulatory Visit (INDEPENDENT_AMBULATORY_CARE_PROVIDER_SITE_OTHER): Payer: Medicare Other

## 2014-05-10 DIAGNOSIS — E559 Vitamin D deficiency, unspecified: Secondary | ICD-10-CM

## 2014-05-10 DIAGNOSIS — E785 Hyperlipidemia, unspecified: Secondary | ICD-10-CM | POA: Diagnosis not present

## 2014-05-10 DIAGNOSIS — I10 Essential (primary) hypertension: Secondary | ICD-10-CM

## 2014-05-10 LAB — POCT CBC
Granulocyte percent: 58.1 %G (ref 37–80)
HCT, POC: 41.7 % (ref 37.7–47.9)
Hemoglobin: 13.6 g/dL (ref 12.2–16.2)
Lymph, poc: 1.3 (ref 0.6–3.4)
MCH, POC: 30.8 pg (ref 27–31.2)
MCHC: 32.7 g/dL (ref 31.8–35.4)
MCV: 94.1 fL (ref 80–97)
MPV: 7.6 fL (ref 0–99.8)
POC Granulocyte: 2.2 (ref 2–6.9)
POC LYMPH PERCENT: 33.3 %L (ref 10–50)
Platelet Count, POC: 253 10*3/uL (ref 142–424)
RBC: 4.4 M/uL (ref 4.04–5.48)
RDW, POC: 13.6 %
WBC: 3.8 10*3/uL — AB (ref 4.6–10.2)

## 2014-05-10 NOTE — Progress Notes (Signed)
Pt came in for lab  only 

## 2014-05-11 ENCOUNTER — Other Ambulatory Visit: Payer: Self-pay | Admitting: Family Medicine

## 2014-05-11 LAB — NMR, LIPOPROFILE
Cholesterol: 181 mg/dL (ref 100–199)
HDL Cholesterol by NMR: 79 mg/dL (ref >39)
HDL Particle Number: 46.2 umol/L (ref >=30.5)
LDL Particle Number: 1029 nmol/L — ABNORMAL HIGH (ref <1000)
LDL Size: 21.5 nm (ref >20.5)
LDLC SERPL CALC-MCNC: 90 mg/dL (ref 0–99)
LP-IR Score: <25 (ref <=45)
Small LDL Particle Number: 299 nmol/L (ref <=527)
Triglycerides by NMR: 58 mg/dL (ref 0–149)

## 2014-05-11 LAB — BMP8+EGFR
BUN/Creatinine Ratio: 28 — ABNORMAL HIGH (ref 11–26)
BUN: 17 mg/dL (ref 8–27)
CO2: 24 mmol/L (ref 18–29)
Calcium: 9.4 mg/dL (ref 8.7–10.3)
Chloride: 102 mmol/L (ref 97–108)
Creatinine, Ser: 0.61 mg/dL (ref 0.57–1.00)
GFR calc Af Amer: 107 mL/min/{1.73_m2} (ref >59)
GFR calc non Af Amer: 93 mL/min/{1.73_m2} (ref >59)
Glucose: 102 mg/dL — ABNORMAL HIGH (ref 65–99)
Potassium: 4.5 mmol/L (ref 3.5–5.2)
Sodium: 141 mmol/L (ref 134–144)

## 2014-05-11 LAB — HEPATIC FUNCTION PANEL
ALT: 29 IU/L (ref 0–32)
AST: 27 IU/L (ref 0–40)
Albumin: 4.4 g/dL (ref 3.6–4.8)
Alkaline Phosphatase: 71 IU/L (ref 39–117)
Bilirubin, Direct: 0.12 mg/dL (ref 0.00–0.40)
Total Bilirubin: 0.4 mg/dL (ref 0.0–1.2)
Total Protein: 6.5 g/dL (ref 6.0–8.5)

## 2014-05-11 LAB — VITAMIN D 25 HYDROXY (VIT D DEFICIENCY, FRACTURES): Vit D, 25-Hydroxy: 42.6 ng/mL (ref 30.0–100.0)

## 2014-05-16 ENCOUNTER — Ambulatory Visit (INDEPENDENT_AMBULATORY_CARE_PROVIDER_SITE_OTHER): Payer: Medicare Other

## 2014-05-16 ENCOUNTER — Ambulatory Visit (INDEPENDENT_AMBULATORY_CARE_PROVIDER_SITE_OTHER): Payer: Medicare Other | Admitting: Family Medicine

## 2014-05-16 ENCOUNTER — Encounter: Payer: Self-pay | Admitting: Family Medicine

## 2014-05-16 VITALS — BP 122/76 | HR 66 | Temp 98.6°F | Ht 68.0 in | Wt 181.0 lb

## 2014-05-16 DIAGNOSIS — E785 Hyperlipidemia, unspecified: Secondary | ICD-10-CM | POA: Diagnosis not present

## 2014-05-16 DIAGNOSIS — M25579 Pain in unspecified ankle and joints of unspecified foot: Secondary | ICD-10-CM

## 2014-05-16 DIAGNOSIS — E559 Vitamin D deficiency, unspecified: Secondary | ICD-10-CM

## 2014-05-16 DIAGNOSIS — M25551 Pain in right hip: Secondary | ICD-10-CM

## 2014-05-16 DIAGNOSIS — G8929 Other chronic pain: Secondary | ICD-10-CM | POA: Insufficient documentation

## 2014-05-16 DIAGNOSIS — M25571 Pain in right ankle and joints of right foot: Secondary | ICD-10-CM | POA: Insufficient documentation

## 2014-05-16 DIAGNOSIS — M25559 Pain in unspecified hip: Secondary | ICD-10-CM | POA: Diagnosis not present

## 2014-05-16 DIAGNOSIS — M25569 Pain in unspecified knee: Secondary | ICD-10-CM

## 2014-05-16 DIAGNOSIS — M25562 Pain in left knee: Secondary | ICD-10-CM

## 2014-05-16 DIAGNOSIS — M25561 Pain in right knee: Secondary | ICD-10-CM

## 2014-05-16 DIAGNOSIS — M25552 Pain in left hip: Secondary | ICD-10-CM

## 2014-05-16 NOTE — Patient Instructions (Addendum)
Medicare Annual Wellness Visit  [facility] and the medical providers at [facility] strive to bring you the best medical care.  In doing so we not only want to address your current medical conditions and concerns but also to detect new conditions early and prevent illness, disease and health-related problems.    Medicare offers a yearly Wellness Visit which allows our clinical staff to assess your need for preventative services including immunizations, lifestyle education, counseling to decrease risk of preventable diseases and screening for fall risk and other medical concerns.    This visit is provided free of charge (no copay) for all Medicare recipients. The clinical pharmacists at [facility] have begun to conduct these Wellness Visits which will also include a thorough review of all your medications.    As you primary medical provider recommend that you make an appointment for your Annual Wellness Visit if you have not done so already this year.  You may set up this appointment before you leave today or you may call back [phone]) and schedule an appointment.  Please make sure when you call that you mention that you are scheduling your Annual Wellness Visit with the clinical pharmacist so that the appointment may be made for the proper length of time.      Continue current medications. Continue good therapeutic lifestyle changes which include good diet and exercise. Fall precautions discussed with patient. If an FOBT was given today- please return it to our front desk. If you are over 46 years old - you may need Prevnar 60 or the adult Pneumonia vaccine.  Increased vitamin D as directed to 5000 daily Monday through Friday Followup with orthopedist as needed Stay active, drink plenty of water Gradually reduce Zoloft as directed over the next 3 months, alternate 25 and 50 for one month, take 25 daily the next month, and the  third not take 25 every other day until it is complete

## 2014-05-16 NOTE — Progress Notes (Signed)
Subjective:    Patient ID: Kelly George, female    DOB: 12/28/1945, 68 y.o.   MRN: [id number]  HPI Pt here for follow up and management of chronic medical problems. The patient is doing well overall. The patient describes one white chalky-looking stool. She's had no abdominal pain. She also wants to try to reduce her Zoloft. We discussed a schedule for doing this. Her recent lab work was reviewed with her and she was given a copy of this. She is also due to return FOBT. She has had her eye exam. She has seen the orthopedic surgeon and they have placed her on meloxicam for her foot pain.          Patient Active Problem List   Diagnosis Date Noted  . Vitamin D deficiency 07/08/2013  . Mitral valve prolapse syndrome, history of mild 01/05/2013  . Seasonal allergies   . Anxiety   . Depression   . Hyperlipidemia    Outpatient Encounter Prescriptions as of 05/16/2014  Medication Sig  . ALPRAZolam (XANAX) 0.25 MG tablet Take 1 tablet (0.25 mg total) by mouth at bedtime as needed for sleep.  . calcium-vitamin D (OSCAL WITH D) 500-200 MG-UNIT per tablet Take 1 tablet by mouth daily.  . Cholecalciferol (VITAMIN D3) 2000 UNITS TABS Take 1 tablet by mouth daily.  . fish oil-omega-3 fatty acids 1000 MG capsule Take 2 g by mouth daily.  Marland Kitchen glucosamine-chondroitin 500-400 MG tablet Take 1 tablet by mouth 2 (two) times daily.   . meloxicam (MOBIC) 15 MG tablet 15 mg daily as needed for pain.  . Multiple Vitamins-Minerals (MULTIVITAMIN WITH MINERALS) tablet Take 1 tablet by mouth daily.  . sertraline (ZOLOFT) 50 MG tablet TAKE ONE TABLET BY MOUTH ONCE DAILY  . simvastatin (ZOCOR) 10 MG tablet TAKE ONE TABLET BY MOUTH AT BEDTIME    Review of Systems  Constitutional: Negative.   HENT: Negative.   Eyes: Negative.   Respiratory: Negative.   Cardiovascular: Negative.   Gastrointestinal: Negative.        1 abnormal BM few days ago- ok now  Endocrine: Negative.   Genitourinary: Negative.     Musculoskeletal: Negative.   Skin: Negative.   Allergic/Immunologic: Negative.   Neurological: Negative.   Hematological: Negative.   Psychiatric/Behavioral: Negative.        Objective:   Physical Exam  Nursing note and vitals reviewed. Constitutional: She is oriented to person, place, and time. She appears well-developed and well-nourished. No distress.  The patient is pleasant and in good spirits today.  HENT:  Head: Normocephalic and atraumatic.  Right Ear: External ear normal.  Left Ear: External ear normal.  Nose: Nose normal.  Mouth/Throat: Oropharynx is clear and moist. No oropharyngeal exudate.  Eyes: Conjunctivae and EOM are normal. Pupils are equal, round, and reactive to light. Right eye exhibits no discharge. Left eye exhibits no discharge. No scleral icterus.  Neck: Normal range of motion. Neck supple. No thyromegaly present.  There are no carotid bruits.  Cardiovascular: Normal rate, regular rhythm and intact distal pulses.  Exam reveals no gallop and no friction rub.   Murmur heard. The heart has a regular rate and rhythm with a grade 1/6 systolic ejection murmur. This has been evaluated in the past with an echocardiogram.  Pulmonary/Chest: Effort normal and breath sounds normal. No respiratory distress. She has no wheezes. She has no rales. She exhibits no tenderness.  Abdominal: Soft. Bowel sounds are normal. She exhibits no mass. There is no  tenderness. There is no rebound and no guarding.  Musculoskeletal: Normal range of motion. She exhibits no edema and no tenderness.  Lymphadenopathy:    She has no cervical adenopathy.  Neurological: She is alert and oriented to person, place, and time. She has normal reflexes. No cranial nerve deficit.  Skin: Skin is warm and dry. No rash noted.  Psychiatric: She has a normal mood and affect. Her behavior is normal. Judgment and thought content normal.  The patient submitted send to be stable and she is smiling and does not  appear depressed or anxious.   BP 122/76  Pulse 66  Temp(Src) 98.6 F (37 C) (Oral)  Ht 5\' 8"  (1.727 m)  Wt 181 lb (82.101 kg)  BMI 27.53 kg/m2  [facility] reading (PRIMARY) by  Dr. Brunilda Payor x-ray-no active disease                                     Assessment & Plan:  1. Hyperlipidemia - DG Chest 2 View; Future  2. Vitamin D deficiency - DG Chest 2 View; Future  3. Chronic arthralgias of knees and hips, unspecified laterality  4. Pain in joint of right foot -Continue to followup with orthopedic   Patient Instructions                       Medicare Annual Wellness Visit  [facility] and the medical providers at [facility] strive to bring you the best medical care.  In doing so we not only want to address your current medical conditions and concerns but also to detect new conditions early and prevent illness, disease and health-related problems.    Medicare offers a yearly Wellness Visit which allows our clinical staff to assess your need for preventative services including immunizations, lifestyle education, counseling to decrease risk of preventable diseases and screening for fall risk and other medical concerns.    This visit is provided free of charge (no copay) for all Medicare recipients. The clinical pharmacists at [facility] have begun to conduct these Wellness Visits which will also include a thorough review of all your medications.    As you primary medical provider recommend that you make an appointment for your Annual Wellness Visit if you have not done so already this year.  You may set up this appointment before you leave today or you may call back ([phone]) and schedule an appointment.  Please make sure when you call that you mention that you are scheduling your Annual Wellness Visit with the clinical pharmacist so that the appointment may be made for the proper length of time.      Continue current  medications. Continue good therapeutic lifestyle changes which include good diet and exercise. Fall precautions discussed with patient. If an FOBT was given today- please return it to our front desk. If you are over 5 years old - you may need Prevnar 52 or the adult Pneumonia vaccine.  Increased vitamin D as directed to 5000 daily Monday through Friday Followup with orthopedist as needed Stay active, drink plenty of water Gradually reduce Zoloft as directed over the next 3 months, alternate 25 and 50 for one month, take 25 daily the next month, and the third not take 25 every other day until it is complete   Arrie Senate MD

## 2014-05-17 ENCOUNTER — Telehealth: Payer: Self-pay

## 2014-05-17 NOTE — Telephone Encounter (Signed)
Pt aware of CXR results.

## 2014-05-17 NOTE — Telephone Encounter (Signed)
Message copied by Koren Bound on Tue May 17, 2014  8:57 AM ------      Message from: Chipper Herb      Created: Mon May 16, 2014  3:27 PM       As per radiology report ------

## 2014-06-02 ENCOUNTER — Other Ambulatory Visit: Payer: Medicare Other

## 2014-06-02 DIAGNOSIS — Z1212 Encounter for screening for malignant neoplasm of rectum: Secondary | ICD-10-CM

## 2014-06-02 NOTE — Progress Notes (Signed)
Patient dropped off fobt 

## 2014-06-04 LAB — FECAL OCCULT BLOOD, IMMUNOCHEMICAL: Fecal Occult Bld: NEGATIVE

## 2014-06-22 ENCOUNTER — Telehealth: Payer: Self-pay | Admitting: Family Medicine

## 2014-06-22 NOTE — Telephone Encounter (Signed)
Called in.

## 2014-06-22 NOTE — Telephone Encounter (Signed)
Sertraline 25 mg #30 one daily as directed 2 refills

## 2014-07-06 ENCOUNTER — Other Ambulatory Visit: Payer: Self-pay | Admitting: Family Medicine

## 2014-08-09 ENCOUNTER — Ambulatory Visit: Payer: Medicare Other

## 2014-08-10 ENCOUNTER — Ambulatory Visit (INDEPENDENT_AMBULATORY_CARE_PROVIDER_SITE_OTHER): Payer: Medicare Other

## 2014-08-10 DIAGNOSIS — Z23 Encounter for immunization: Secondary | ICD-10-CM | POA: Diagnosis not present

## 2014-09-01 DIAGNOSIS — D485 Neoplasm of uncertain behavior of skin: Secondary | ICD-10-CM | POA: Diagnosis not present

## 2014-09-01 DIAGNOSIS — D225 Melanocytic nevi of trunk: Secondary | ICD-10-CM | POA: Diagnosis not present

## 2014-09-01 DIAGNOSIS — L814 Other melanin hyperpigmentation: Secondary | ICD-10-CM | POA: Diagnosis not present

## 2014-09-01 DIAGNOSIS — L821 Other seborrheic keratosis: Secondary | ICD-10-CM | POA: Diagnosis not present

## 2014-09-16 ENCOUNTER — Encounter: Payer: Self-pay | Admitting: Family Medicine

## 2014-09-16 ENCOUNTER — Ambulatory Visit (INDEPENDENT_AMBULATORY_CARE_PROVIDER_SITE_OTHER): Payer: Medicare Other | Admitting: Family Medicine

## 2014-09-16 VITALS — BP 150/81 | HR 65 | Temp 97.3°F | Ht 68.0 in | Wt 183.0 lb

## 2014-09-16 DIAGNOSIS — E559 Vitamin D deficiency, unspecified: Secondary | ICD-10-CM

## 2014-09-16 DIAGNOSIS — E785 Hyperlipidemia, unspecified: Secondary | ICD-10-CM | POA: Diagnosis not present

## 2014-09-16 DIAGNOSIS — M25571 Pain in right ankle and joints of right foot: Secondary | ICD-10-CM

## 2014-09-16 DIAGNOSIS — R030 Elevated blood-pressure reading, without diagnosis of hypertension: Secondary | ICD-10-CM

## 2014-09-16 DIAGNOSIS — IMO0001 Reserved for inherently not codable concepts without codable children: Secondary | ICD-10-CM

## 2014-09-16 MED ORDER — SIMVASTATIN 10 MG PO TABS: 10.0000 mg | ORAL_TABLET | Freq: Every day | ORAL | Status: DC

## 2014-09-16 MED ORDER — ALPRAZOLAM 0.25 MG PO TABS: 0.2500 mg | ORAL_TABLET | Freq: Every evening | ORAL | Status: DC | PRN

## 2014-09-16 NOTE — Progress Notes (Signed)
Subjective:    Patient ID: Kelly George, female    DOB: [birth year], 68 y.o.   MRN: [id number]  HPI Pt here for follow up and management of chronic medical problems. The patient is doing well today with no particular complaints. She does request that her Xanax and simvastatin be refilled. She is also due to get lab work and a mammogram. The patient gets her pelvic exam every 2 years. We will do a breast exam today and she does home breast exams regularly. Her mother had breast cancer.       Patient Active Problem List   Diagnosis Date Noted  . Pain in joint of right foot 05/16/2014  . Vitamin D deficiency 07/08/2013  . Mitral valve prolapse syndrome, history of mild 01/05/2013  . Seasonal allergies   . Anxiety   . Depression   . Hyperlipidemia    Outpatient Encounter Prescriptions as of 09/16/2014  Medication Sig  . calcium-vitamin D (OSCAL WITH D) 500-200 MG-UNIT per tablet Take 1 tablet by mouth daily.  . Cholecalciferol (VITAMIN D3) 2000 UNITS TABS Take 1 tablet by mouth daily.  . fish oil-omega-3 fatty acids 1000 MG capsule Take 2 g by mouth daily.  Marland Kitchen glucosamine-chondroitin 500-400 MG tablet Take 1 tablet by mouth 2 (two) times daily.   . Multiple Vitamins-Minerals (MULTIVITAMIN WITH MINERALS) tablet Take 1 tablet by mouth daily.  . simvastatin (ZOCOR) 10 MG tablet TAKE ONE TABLET BY MOUTH AT BEDTIME  . ALPRAZolam (XANAX) 0.25 MG tablet Take 1 tablet (0.25 mg total) by mouth at bedtime as needed for sleep. (Patient not taking: Reported on 09/16/2014)  . meloxicam (MOBIC) 15 MG tablet 15 mg daily as needed for pain.  . [DISCONTINUED] sertraline (ZOLOFT) 50 MG tablet TAKE ONE TABLET BY MOUTH ONCE DAILY  . [DISCONTINUED] simvastatin (ZOCOR) 10 MG tablet TAKE ONE TABLET BY MOUTH AT BEDTIME    Review of Systems  Constitutional: Negative.   HENT: Negative.   Eyes: Negative.   Respiratory: Negative.   Cardiovascular: Negative.   Gastrointestinal: Negative.   Endocrine:  Negative.   Genitourinary: Negative.   Musculoskeletal: Negative.   Skin: Negative.   Allergic/Immunologic: Negative.   Neurological: Negative.   Hematological: Negative.   Psychiatric/Behavioral: Negative.        Objective:   Physical Exam  Constitutional: She is oriented to person, place, and time. She appears well-developed and well-nourished. No distress.  HENT:  Head: Normocephalic and atraumatic.  Right Ear: External ear normal.  Left Ear: External ear normal.  Nose: Nose normal.  Mouth/Throat: Oropharynx is clear and moist.  Eyes: Conjunctivae and EOM are normal. Pupils are equal, round, and reactive to light. Right eye exhibits no discharge. Left eye exhibits no discharge. No scleral icterus.  Neck: Normal range of motion. Neck supple. No thyromegaly present.  No carotid bruits or anterior cervical adenopathy  Cardiovascular: Normal rate, regular rhythm, normal heart sounds and intact distal pulses.   No murmur heard. At 60/m  Pulmonary/Chest: Effort normal and breath sounds normal. No respiratory distress. She has no wheezes. She has no rales. She exhibits no tenderness.  Abdominal: Soft. Bowel sounds are normal. She exhibits no mass. There is no tenderness. There is no rebound and no guarding.  Musculoskeletal: Normal range of motion. She exhibits tenderness (slight tenderness in the right foot). She exhibits no edema.  Lymphadenopathy:    She has no cervical adenopathy.  Neurological: She is alert and oriented to person, place, and time. She has normal  reflexes. No cranial nerve deficit.  Skin: Skin is warm and dry. No rash noted.  Psychiatric: She has a normal mood and affect. Her behavior is normal. Judgment and thought content normal.  Nursing note and vitals reviewed.   BP 142/86 mmHg  Pulse 65  Temp(Src) 97.3 F (36.3 C) (Oral)  Ht 5' 8" (1.727 m)  Wt 183 lb (83.008 kg)  BMI 27.83 kg/m2        Assessment & Plan:  1. Hyperlipidemia - POCT CBC;  Future - BMP8+EGFR; Future - Hepatic function panel; Future - NMR, lipoprofile; Future  2. Vitamin D deficiency - POCT CBC; Future - Vit D  25 hydroxy (rtn osteoporosis monitoring); Future  3. Elevated blood pressure -Continue to monitor blood pressures at home and watch sodium intake  4. Pain in joint, ankle and foot, right -Take ibuprofen as needed and wear good support shoes  Patient Instructions                       Medicare Annual Wellness Visit  [facility] and the medical providers at [facility] strive to bring you the best medical care.  In doing so we not only want to address your current medical conditions and concerns but also to detect new conditions early and prevent illness, disease and health-related problems.    Medicare offers a yearly Wellness Visit which allows our clinical staff to assess your need for preventative services including immunizations, lifestyle education, counseling to decrease risk of preventable diseases and screening for fall risk and other medical concerns.    This visit is provided free of charge (no copay) for all Medicare recipients. The clinical pharmacists at [facility] have begun to conduct these Wellness Visits which will also include a thorough review of all your medications.    As you primary medical provider recommend that you make an appointment for your Annual Wellness Visit if you have not done so already this year.  You may set up this appointment before you leave today or you may call back ([phone]) and schedule an appointment.  Please make sure when you call that you mention that you are scheduling your Annual Wellness Visit with the clinical pharmacist so that the appointment may be made for the proper length of time.     Continue current medications. Continue good therapeutic lifestyle changes which include good diet and exercise. Fall precautions discussed with patient. If an FOBT  was given today- please return it to our front desk. If you are over 45 years old - you may need Prevnar 9 or the adult Pneumonia vaccine.  Flu Shots will be available at our office starting mid- September. Please call and schedule a FLU CLINIC APPOINTMENT.   Do not forget to schedule your mammogram Return to clinic fasting for lab work Check breasts monthly Monitor blood pressures more closely at home and watch sodium intake   Arrie Senate MD

## 2014-09-16 NOTE — Patient Instructions (Addendum)
Medicare Annual Wellness Visit  [facility] and the medical providers at [facility] strive to bring you the best medical care.  In doing so we not only want to address your current medical conditions and concerns but also to detect new conditions early and prevent illness, disease and health-related problems.    Medicare offers a yearly Wellness Visit which allows our clinical staff to assess your need for preventative services including immunizations, lifestyle education, counseling to decrease risk of preventable diseases and screening for fall risk and other medical concerns.    This visit is provided free of charge (no copay) for all Medicare recipients. The clinical pharmacists at [facility] have begun to conduct these Wellness Visits which will also include a thorough review of all your medications.    As you primary medical provider recommend that you make an appointment for your Annual Wellness Visit if you have not done so already this year.  You may set up this appointment before you leave today or you may call back [phone]) and schedule an appointment.  Please make sure when you call that you mention that you are scheduling your Annual Wellness Visit with the clinical pharmacist so that the appointment may be made for the proper length of time.     Continue current medications. Continue good therapeutic lifestyle changes which include good diet and exercise. Fall precautions discussed with patient. If an FOBT was given today- please return it to our front desk. If you are over 73 years old - you may need Prevnar 3 or the adult Pneumonia vaccine.  Flu Shots will be available at our office starting mid- September. Please call and schedule a FLU CLINIC APPOINTMENT.   Do not forget to schedule your mammogram Return to clinic fasting for lab work Check breasts monthly Monitor blood pressures more closely at home and  watch sodium intake

## 2014-10-13 ENCOUNTER — Other Ambulatory Visit (INDEPENDENT_AMBULATORY_CARE_PROVIDER_SITE_OTHER): Payer: Medicare Other

## 2014-10-13 DIAGNOSIS — E785 Hyperlipidemia, unspecified: Secondary | ICD-10-CM

## 2014-10-13 DIAGNOSIS — E559 Vitamin D deficiency, unspecified: Secondary | ICD-10-CM

## 2014-10-13 LAB — POCT CBC
Granulocyte percent: 68.7 %G (ref 37–80)
HCT, POC: 41.7 % (ref 37.7–47.9)
Hemoglobin: 13.6 g/dL (ref 12.2–16.2)
Lymph, poc: 1.1 (ref 0.6–3.4)
MCH, POC: 30.7 pg (ref 27–31.2)
MCHC: 32.6 g/dL (ref 31.8–35.4)
MCV: 94.3 fL (ref 80–97)
MPV: 8.5 fL (ref 0–99.8)
POC Granulocyte: 3.1 (ref 2–6.9)
POC LYMPH PERCENT: 25.2 %L (ref 10–50)
Platelet Count, POC: 224 10*3/uL (ref 142–424)
RBC: 4.4 M/uL (ref 4.04–5.48)
RDW, POC: 13 %
WBC: 4.5 10*3/uL — AB (ref 4.6–10.2)

## 2014-10-13 NOTE — Progress Notes (Signed)
Lab only 

## 2014-10-14 LAB — NMR, LIPOPROFILE
Cholesterol: 194 mg/dL (ref 100–199)
HDL Cholesterol by NMR: 97 mg/dL (ref >39)
HDL Particle Number: 46.1 umol/L (ref >=30.5)
LDL Particle Number: 846 nmol/L (ref <1000)
LDL Size: 21.5 nm (ref >20.5)
LDL-C: 84 mg/dL (ref 0–99)
LP-IR Score: 25 (ref <=45)
Small LDL Particle Number: <90 nmol/L (ref <=527)
Triglycerides by NMR: 64 mg/dL (ref 0–149)

## 2014-10-14 LAB — BMP8+EGFR
BUN/Creatinine Ratio: 21 (ref 11–26)
BUN: 14 mg/dL (ref 8–27)
CO2: 28 mmol/L (ref 18–29)
Calcium: 10.1 mg/dL (ref 8.7–10.3)
Chloride: 101 mmol/L (ref 97–108)
Creatinine, Ser: 0.67 mg/dL (ref 0.57–1.00)
GFR calc Af Amer: 103 mL/min/{1.73_m2} (ref >59)
GFR calc non Af Amer: 89 mL/min/{1.73_m2} (ref >59)
Glucose: 103 mg/dL — ABNORMAL HIGH (ref 65–99)
Potassium: 4.4 mmol/L (ref 3.5–5.2)
Sodium: 142 mmol/L (ref 134–144)

## 2014-10-14 LAB — HEPATIC FUNCTION PANEL
ALT: 44 IU/L — ABNORMAL HIGH (ref 0–32)
AST: 31 IU/L (ref 0–40)
Albumin: 4.5 g/dL (ref 3.5–4.8)
Alkaline Phosphatase: 83 IU/L (ref 39–117)
Bilirubin, Direct: 0.16 mg/dL (ref 0.00–0.40)
Total Bilirubin: 0.5 mg/dL (ref 0.0–1.2)
Total Protein: 6.7 g/dL (ref 6.0–8.5)

## 2014-10-14 LAB — VITAMIN D 25 HYDROXY (VIT D DEFICIENCY, FRACTURES): Vit D, 25-Hydroxy: 40.8 ng/mL (ref 30.0–100.0)

## 2014-10-17 ENCOUNTER — Other Ambulatory Visit: Payer: Self-pay

## 2014-10-17 DIAGNOSIS — Z1231 Encounter for screening mammogram for malignant neoplasm of breast: Secondary | ICD-10-CM

## 2014-10-28 ENCOUNTER — Ambulatory Visit
Admission: RE | Admit: 2014-10-28 | Discharge: 2014-10-28 | Disposition: A | Discharge status: Home / Self Care | Payer: Medicare Other | Source: Ambulatory Visit

## 2014-10-28 DIAGNOSIS — Z1231 Encounter for screening mammogram for malignant neoplasm of breast: Secondary | ICD-10-CM

## 2014-11-23 ENCOUNTER — Telehealth: Payer: Self-pay | Admitting: Family Medicine

## 2014-11-24 MED ORDER — OSELTAMIVIR PHOSPHATE 75 MG PO CAPS
75.0000 mg | ORAL_CAPSULE | Freq: Every day | ORAL | Status: DC
Start: 2014-11-24 — End: 2015-03-31

## 2014-11-24 NOTE — Telephone Encounter (Signed)
Please call and Tamiflu 75 mg 1 daily for 10 days #10

## 2014-11-24 NOTE — Telephone Encounter (Signed)
Sent to pharmacy 

## 2015-01-27 ENCOUNTER — Encounter: Payer: Self-pay | Admitting: *Deleted

## 2015-03-20 ENCOUNTER — Ambulatory Visit: Payer: Medicare Other | Admitting: Family Medicine

## 2015-03-27 ENCOUNTER — Other Ambulatory Visit (INDEPENDENT_AMBULATORY_CARE_PROVIDER_SITE_OTHER): Payer: Medicare Other

## 2015-03-27 DIAGNOSIS — E785 Hyperlipidemia, unspecified: Secondary | ICD-10-CM | POA: Diagnosis not present

## 2015-03-27 DIAGNOSIS — R030 Elevated blood-pressure reading, without diagnosis of hypertension: Secondary | ICD-10-CM

## 2015-03-27 DIAGNOSIS — E559 Vitamin D deficiency, unspecified: Secondary | ICD-10-CM | POA: Diagnosis not present

## 2015-03-27 DIAGNOSIS — IMO0001 Reserved for inherently not codable concepts without codable children: Secondary | ICD-10-CM

## 2015-03-27 LAB — POCT CBC
Granulocyte percent: 60.4 %G (ref 37–80)
HCT, POC: 42.4 % (ref 37.7–47.9)
Hemoglobin: 13.4 g/dL (ref 12.2–16.2)
Lymph, poc: 1.4 (ref 0.6–3.4)
MCH, POC: 29.8 pg (ref 27–31.2)
MCHC: 31.6 g/dL — AB (ref 31.8–35.4)
MCV: 94.3 fL (ref 80–97)
MPV: 9 fL (ref 0–99.8)
POC Granulocyte: 2.6 (ref 2–6.9)
POC LYMPH PERCENT: 32.7 %L (ref 10–50)
Platelet Count, POC: 230 10*3/uL (ref 142–424)
RBC: 4.5 M/uL (ref 4.04–5.48)
RDW, POC: 13.6 %
WBC: 4.3 10*3/uL — AB (ref 4.6–10.2)

## 2015-03-27 NOTE — Progress Notes (Signed)
Lab only 

## 2015-03-28 LAB — NMR, LIPOPROFILE
Cholesterol: 194 mg/dL (ref 100–199)
HDL Cholesterol by NMR: 87 mg/dL (ref >39)
HDL Particle Number: 46.9 umol/L (ref >=30.5)
LDL Particle Number: 1117 nmol/L — ABNORMAL HIGH (ref <1000)
LDL Size: 21.2 nm (ref >20.5)
LDL-C: 95 mg/dL (ref 0–99)
LP-IR Score: 27 (ref <=45)
Small LDL Particle Number: 232 nmol/L (ref <=527)
Triglycerides by NMR: 58 mg/dL (ref 0–149)

## 2015-03-28 LAB — HEPATIC FUNCTION PANEL
ALT: 37 IU/L — ABNORMAL HIGH (ref 0–32)
AST: 35 IU/L (ref 0–40)
Albumin: 4.5 g/dL (ref 3.5–4.8)
Alkaline Phosphatase: 71 IU/L (ref 39–117)
Bilirubin Total: 0.5 mg/dL (ref 0.0–1.2)
Bilirubin, Direct: 0.15 mg/dL (ref 0.00–0.40)
Total Protein: 6.7 g/dL (ref 6.0–8.5)

## 2015-03-28 LAB — BMP8+EGFR
BUN/Creatinine Ratio: 26 (ref 11–26)
BUN: 17 mg/dL (ref 8–27)
CO2: 25 mmol/L (ref 18–29)
Calcium: 10.2 mg/dL (ref 8.7–10.3)
Chloride: 101 mmol/L (ref 97–108)
Creatinine, Ser: 0.66 mg/dL (ref 0.57–1.00)
GFR calc Af Amer: 104 mL/min/{1.73_m2} (ref >59)
GFR calc non Af Amer: 90 mL/min/{1.73_m2} (ref >59)
Glucose: 98 mg/dL (ref 65–99)
Potassium: 4.4 mmol/L (ref 3.5–5.2)
Sodium: 140 mmol/L (ref 134–144)

## 2015-03-28 LAB — VITAMIN D 25 HYDROXY (VIT D DEFICIENCY, FRACTURES): Vit D, 25-Hydroxy: 54 ng/mL (ref 30.0–100.0)

## 2015-03-31 ENCOUNTER — Ambulatory Visit (INDEPENDENT_AMBULATORY_CARE_PROVIDER_SITE_OTHER): Payer: Medicare Other

## 2015-03-31 ENCOUNTER — Ambulatory Visit (INDEPENDENT_AMBULATORY_CARE_PROVIDER_SITE_OTHER): Payer: Medicare Other | Admitting: Family Medicine

## 2015-03-31 ENCOUNTER — Encounter: Payer: Self-pay | Admitting: Family Medicine

## 2015-03-31 VITALS — BP 120/77 | HR 66 | Temp 97.0°F | Ht 68.0 in | Wt 188.0 lb

## 2015-03-31 DIAGNOSIS — IMO0001 Reserved for inherently not codable concepts without codable children: Secondary | ICD-10-CM

## 2015-03-31 DIAGNOSIS — R030 Elevated blood-pressure reading, without diagnosis of hypertension: Secondary | ICD-10-CM

## 2015-03-31 DIAGNOSIS — I341 Nonrheumatic mitral (valve) prolapse: Secondary | ICD-10-CM | POA: Diagnosis not present

## 2015-03-31 DIAGNOSIS — F329 Major depressive disorder, single episode, unspecified: Secondary | ICD-10-CM

## 2015-03-31 DIAGNOSIS — R0602 Shortness of breath: Secondary | ICD-10-CM

## 2015-03-31 DIAGNOSIS — F32A Depression, unspecified: Secondary | ICD-10-CM

## 2015-03-31 DIAGNOSIS — M25571 Pain in right ankle and joints of right foot: Secondary | ICD-10-CM

## 2015-03-31 DIAGNOSIS — E785 Hyperlipidemia, unspecified: Secondary | ICD-10-CM

## 2015-03-31 DIAGNOSIS — R0789 Other chest pain: Secondary | ICD-10-CM | POA: Diagnosis not present

## 2015-03-31 DIAGNOSIS — R42 Dizziness and giddiness: Secondary | ICD-10-CM | POA: Diagnosis not present

## 2015-03-31 DIAGNOSIS — E559 Vitamin D deficiency, unspecified: Secondary | ICD-10-CM

## 2015-03-31 NOTE — Patient Instructions (Addendum)
Medicare Annual Wellness Visit  [facility] and the medical providers at [facility] strive to bring you the best medical care.  In doing so we not only want to address your current medical conditions and concerns but also to detect new conditions early and prevent illness, disease and health-related problems.    Medicare offers a yearly Wellness Visit which allows our clinical staff to assess your need for preventative services including immunizations, lifestyle education, counseling to decrease risk of preventable diseases and screening for fall risk and other medical concerns.    This visit is provided free of charge (no copay) for all Medicare recipients. The clinical pharmacists at [facility] have begun to conduct these Wellness Visits which will also include a thorough review of all your medications.    As you primary medical provider recommend that you make an appointment for your Annual Wellness Visit if you have not done so already this year.  You may set up this appointment before you leave today or you may call back [phone]) and schedule an appointment.  Please make sure when you call that you mention that you are scheduling your Annual Wellness Visit with the clinical pharmacist so that the appointment may be made for the proper length of time.     Continue current medications. Continue good therapeutic lifestyle changes which include good diet and exercise. Fall precautions discussed with patient. If an FOBT was given today- please return it to our front desk. If you are over 66 years old - you may need Prevnar 6 or the adult Pneumonia vaccine.  Flu Shots are still available at our office. If you still haven't had one please call to set up a nurse visit to get one.   After your visit with [place] today you will receive a survey in the mail or online from [employer] regarding your care with [place]. Please take a moment to  fill this out. Your feedback is very important to [place] as you can help [place] better understand your patient needs as well as improve your experience and satisfaction. WE CARE ABOUT YOU!!!   Because of the history of mitral valve prolapse, we will arrange for you to see the cardiologist for possible repeat echocardiogram and further evaluation of your chest discomfort and tightness. Because of the continued pain and especially stiffness in the right ankle, we will get an MRI to further evaluate this with a possible referral back to the orthopedist for further evaluation Please check your insurance for the new antidepressant that was mentioned to you during the visit today and if you decide you want to try this will be glad to call a prescription in for this for you. Continue to exercise as much as possible and be careful not to put yourself at risk for falling The summer drink plenty of fluids and stay well hydrated

## 2015-03-31 NOTE — Progress Notes (Signed)
Subjective:    Patient ID: Kelly George, female    DOB: 04/22/1946, 69 y.o.   MRN: [id number]  HPI Pt here for follow up and management of chronic medical problems which includes hypertension and hyperlipidemia. She is taking medications regularly. The patient has had several episodes of chest tightness and shortness of breath with exertion. She has a history of mitral valve prolapse and has not seen the cardiologist in several years. The patient has moved away from her local community and lives in a different area of different county now. She has had a history of depression in the past and is feeling some of the same symptoms again but prefers not to take any medication possible. She is also has trouble the right ankle feeling like it's giving away has seen the orthopedist in the past about this.      Patient Active Problem List   Diagnosis Date Noted  . Pain in joint of right foot 05/16/2014  . Vitamin D deficiency 07/08/2013  . Mitral valve prolapse syndrome, history of mild 01/05/2013  . Seasonal allergies   . Anxiety   . Depression   . Hyperlipidemia    Outpatient Encounter Prescriptions as of 03/31/2015  Medication Sig  . ALPRAZolam (XANAX) 0.25 MG tablet Take 1 tablet (0.25 mg total) by mouth at bedtime as needed for sleep.  . calcium-vitamin D (OSCAL WITH D) 500-200 MG-UNIT per tablet Take 1 tablet by mouth daily.  . Cholecalciferol (VITAMIN D3) 2000 UNITS TABS Take 1 tablet by mouth daily.  . fish oil-omega-3 fatty acids 1000 MG capsule Take 2 g by mouth daily.  Marland Kitchen glucosamine-chondroitin 500-400 MG tablet Take 1 tablet by mouth 2 (two) times daily.   . meloxicam (MOBIC) 15 MG tablet 15 mg daily as needed for pain.  . Multiple Vitamins-Minerals (MULTIVITAMIN WITH MINERALS) tablet Take 1 tablet by mouth daily.  . simvastatin (ZOCOR) 10 MG tablet Take 1 tablet (10 mg total) by mouth at bedtime.  . [DISCONTINUED] oseltamivir (TAMIFLU) 75 MG capsule Take 1 capsule (75 mg  total) by mouth daily.   No facility-administered encounter medications on file as of 03/31/2015.      Review of Systems  Constitutional: Negative.   HENT: Negative.   Eyes: Negative.   Respiratory: Positive for chest tightness (with excertion) and shortness of breath. Cough: with excertion.   Cardiovascular: Negative.   Gastrointestinal: Negative.   Endocrine: Negative.   Genitourinary: Negative.   Musculoskeletal: Positive for arthralgias (right ankle pain and the joint "gives").  Skin: Negative.   Allergic/Immunologic: Negative.   Neurological: Positive for light-headedness (with excertion).  Hematological: Negative.   Psychiatric/Behavioral: Negative.        Feels "down and depressed" occasionally       Objective:   Physical Exam  Constitutional: She is oriented to person, place, and time. She appears well-developed and well-nourished. No distress.  HENT:  Head: Normocephalic and atraumatic.  Right Ear: External ear normal.  Left Ear: External ear normal.  Nose: Nose normal.  Mouth/Throat: Oropharynx is clear and moist.  Eyes: Conjunctivae and EOM are normal. Pupils are equal, round, and reactive to light. Right eye exhibits no discharge. Left eye exhibits no discharge. No scleral icterus.  Neck: Normal range of motion. Neck supple. No thyromegaly present.  Cardiovascular: Normal rate, regular rhythm and intact distal pulses.  Exam reveals no gallop and no friction rub.   Murmur heard. Pulmonary/Chest: Effort normal and breath sounds normal. No respiratory distress. She has no  wheezes. She has no rales. She exhibits no tenderness.  Abdominal: Soft. Bowel sounds are normal. She exhibits no mass. There is no tenderness. There is no rebound and no guarding.  Musculoskeletal: Normal range of motion. She exhibits tenderness. She exhibits no edema.  The patient has decreased mobility of the right ankle especially from side to side with discomfort with moving it from side to  side. There is no swelling. There is tenderness across the top of the foot.  Lymphadenopathy:    She has no cervical adenopathy.  Neurological: She is alert and oriented to person, place, and time. She has normal reflexes. No cranial nerve deficit.  Skin: Skin is warm and dry. No rash noted.  Psychiatric: She has a normal mood and affect. Her behavior is normal. Judgment and thought content normal.  May be some slight depression but not significant during the interview.  Nursing note and vitals reviewed.  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                                        Assessment & Plan:  1. Vitamin D deficiency -The vitamin D level was good on the most recent blood work and she will continue with this.  2. Hyperlipidemia -Cholesterol numbers were slightly increased but there will be no change in treatment and she will continue with current treatment and try to increase her activity level as tolerated as well as watching her diet.  3. Elevated blood pressure -The blood pressure is good today and she is currently not taking any treatment for this.  4. Right ankle pain -She describes the right ankle pain as soreness and stiffness and has been seen by the orthopedic surgeon and she says this is become worse over the past few months especially with increased weakness and a sensation that she might fall.  5. Depression -She has had problems with depression in the past and prefers not to take any medication for this if possible. She will consider retrying Zoloft or one of the new or medicines like trintellix if  she does not get better.  6. Mitral valve prolapse -She has a history of mitral valve prolapse. She has not been evaluated with an echocardiogram and a good while. We will schedule her for reevaluation for this and she will continue to limit her caffeine intake.  7. Chest tightness or pressure -Evaluation by cardiology  8. Shortness of breath -Evaluation by cardiology  Patient Instructions                       Medicare Annual Wellness Visit  [facility] and the medical providers at [facility] strive to bring you the best medical care.  In doing so we not only want to address your current medical conditions and concerns but also to detect new conditions early and prevent illness, disease and health-related problems.    Medicare offers a yearly Wellness Visit which allows our clinical  staff to assess your need for preventative services including immunizations, lifestyle education, counseling to decrease risk of preventable diseases and screening for fall risk and other medical concerns.    This visit is provided free of charge (no copay) for all Medicare recipients. The clinical pharmacists at [facility] have begun to conduct these Wellness Visits which will also include a thorough review of all your medications.    As you primary medical provider recommend that you make an appointment for your Annual Wellness Visit if you have not done so already this year.  You may set up this appointment before you leave today or you may call back ([phone]) and schedule an appointment.  Please make sure when you call that you mention that you are scheduling your Annual Wellness Visit with the clinical pharmacist so that the appointment may be made for the proper length of time.     Continue current medications. Continue good therapeutic lifestyle changes which include good diet and exercise. Fall precautions discussed with patient. If an FOBT was given  today- please return it to our front desk. If you are over 72 years old - you may need Prevnar 66 or the adult Pneumonia vaccine.  Flu Shots are still available at our office. If you still haven't had one please call to set up a nurse visit to get one.   After your visit with [place] today you will receive a survey in the mail or online from [employer] regarding your care with [place]. Please take a moment to fill this out. Your feedback is very important to [place] as you can help [place] better understand your patient needs as well as improve your experience and satisfaction. WE CARE ABOUT YOU!!!   Because of the history of mitral valve prolapse, we will arrange for you to see the cardiologist for possible repeat echocardiogram and further evaluation of your chest discomfort and tightness. Because of the continued pain and especially stiffness in the right ankle, we will get an MRI to further evaluate this with a possible referral back to the orthopedist for further evaluation Please check your insurance for the new antidepressant that was mentioned to you during the visit today and if you decide you want to try this will be glad to call a prescription in for this for you. Continue to exercise as much as possible and be careful not to put yourself at risk for falling The summer drink plenty of fluids and stay well hydrated   Arrie Senate MD

## 2015-04-03 LAB — THYROID PANEL WITH TSH
Free Thyroxine Index: 2.5 (ref 1.2–4.9)
T3 Uptake Ratio: 33 % (ref 24–39)
T4, Total: 7.5 ug/dL (ref 4.5–12.0)
TSH: 2.87 u[IU]/mL (ref 0.450–4.500)

## 2015-04-03 LAB — SPECIMEN STATUS REPORT

## 2015-04-07 ENCOUNTER — Other Ambulatory Visit: Payer: Self-pay | Admitting: *Deleted

## 2015-04-07 ENCOUNTER — Ambulatory Visit
Admission: RE | Admit: 2015-04-07 | Discharge: 2015-04-07 | Disposition: A | Discharge status: Home / Self Care | Payer: Medicare Other | Source: Ambulatory Visit | Attending: Family Medicine | Admitting: Family Medicine

## 2015-04-07 DIAGNOSIS — M25571 Pain in right ankle and joints of right foot: Secondary | ICD-10-CM

## 2015-04-07 DIAGNOSIS — M19071 Primary osteoarthritis, right ankle and foot: Secondary | ICD-10-CM

## 2015-04-07 DIAGNOSIS — M1990 Unspecified osteoarthritis, unspecified site: Secondary | ICD-10-CM

## 2015-04-18 ENCOUNTER — Telehealth: Payer: Self-pay | Admitting: Family Medicine

## 2015-04-20 ENCOUNTER — Telehealth: Payer: Self-pay | Admitting: Family Medicine

## 2015-04-25 NOTE — Telephone Encounter (Signed)
Informed pt that [facility] has access to view her xrays & MRI's.

## 2015-06-02 DIAGNOSIS — M19071 Primary osteoarthritis, right ankle and foot: Secondary | ICD-10-CM | POA: Diagnosis not present

## 2015-06-27 ENCOUNTER — Telehealth: Payer: Self-pay | Admitting: Family Medicine

## 2015-06-27 NOTE — Telephone Encounter (Signed)
Spoke with patient and she wanted to know if Dr. Laurance Flatten would want the results from cardiologist before he saw her back in November and I advised patient yes.

## 2015-07-07 ENCOUNTER — Encounter: Payer: Self-pay | Admitting: Cardiology

## 2015-07-07 ENCOUNTER — Ambulatory Visit (INDEPENDENT_AMBULATORY_CARE_PROVIDER_SITE_OTHER): Payer: Medicare Other | Admitting: Cardiology

## 2015-07-07 ENCOUNTER — Ambulatory Visit: Payer: PRIVATE HEALTH INSURANCE | Admitting: Cardiology

## 2015-07-07 VITALS — BP 132/90 | HR 68 | Ht 68.0 in | Wt 198.0 lb

## 2015-07-07 DIAGNOSIS — R072 Precordial pain: Secondary | ICD-10-CM | POA: Diagnosis not present

## 2015-07-07 DIAGNOSIS — R011 Cardiac murmur, unspecified: Secondary | ICD-10-CM

## 2015-07-07 DIAGNOSIS — I341 Nonrheumatic mitral (valve) prolapse: Secondary | ICD-10-CM

## 2015-07-07 NOTE — Progress Notes (Signed)
Cardiology Office Note   Date:  07/07/2015   ID:  Kelly George, DOB 1946/06/06, MRN [id number]  PCP:  Redge Gainer, MD  Cardiologist:   Minus Breeding, MD   Chief Complaint  Patient presents with  . Chest Pain      History of Present Illness: Kelly George is a 68 y.o. female who presents for evaluation of chest pain and palpitations. About 3 months ago she was having some fairly frequent palpitations. She would describe this as something happening more at night and somewhat infrequent. Was not happening every day. She'll feel her heart skipping. She also described to Dr. Laurance Flatten some shortness of breath with activities which was progressive. She has had some arthritis and this is limited her activities. I see she's gained a little weight. However, she's noticed more shortness of breath walking a quarter mile on level ground. She's not describing PND or orthopnea. She's not describing presyncope or syncope. She's had no new edema. She did have labs that I reviewed which included normal thyroid and electrolytes.  Past Medical History  Diagnosis Date  . Mitral valve prolapse   . Irregular heart beat   . Seasonal allergies   . Anxiety   . Depression   . Hyperlipidemia     Past Surgical History  Procedure Laterality Date  . Colonoscopy  07/07/01  . Breast lumpectomy      benign  . Tubal ligation       Current Outpatient Prescriptions  Medication Sig Dispense Refill  . ALPRAZolam (XANAX) 0.25 MG tablet Take 1 tablet (0.25 mg total) by mouth at bedtime as needed for sleep. 30 tablet 3  . calcium-vitamin D (OSCAL WITH D) 500-200 MG-UNIT per tablet Take 1 tablet by mouth daily.    . cetirizine (ZYRTEC) 10 MG tablet Take 10 mg by mouth daily.    . Cholecalciferol (VITAMIN D3) 2000 UNITS TABS Take 5,000 Units by mouth daily.     . fish oil-omega-3 fatty acids 1000 MG capsule Take 1,200 mg by mouth daily.     Marland Kitchen glucosamine-chondroitin 500-400 MG tablet Take 1 tablet by mouth  2 (two) times daily.     . meloxicam (MOBIC) 15 MG tablet 15 mg daily as needed for pain.    . naproxen sodium (ANAPROX) 220 MG tablet Take 220 mg by mouth daily. TAKE TWO TABLETS ONCE DAILY    . simvastatin (ZOCOR) 10 MG tablet Take 1 tablet (10 mg total) by mouth at bedtime. 90 tablet 3   No current facility-administered medications for this visit.    Allergies:   Penicillins and Tetracyclines & related    Social History:  The patient  reports that she has never smoked. She has never used smokeless tobacco. She reports that she drinks about 4.8 oz of alcohol per week. She reports that she does not use illicit drugs.   Family History:  The patient's family history includes Breast cancer in her mother; COPD in her mother; Cancer in her father; Diabetes in her father; Heart attack (age of onset: 40) in her father; Uterine cancer in her maternal aunt.    ROS:  Please see the history of present illness.   Otherwise, review of systems are positive for none.   All other systems are reviewed and negative.    PHYSICAL EXAM: VS:  BP 132/90 mmHg  Pulse 68  Ht 5\' 8"  (1.727 m)  Wt 198 lb (89.812 kg)  BMI 30.11 kg/m2 , BMI Body mass index  is 30.11 kg/(m^2). GENERAL:  Well appearing HEENT:  Pupils equal round and reactive, fundi not visualized, oral mucosa unremarkable NECK:  No jugular venous distention, waveform within normal limits, carotid upstroke brisk and symmetric, no bruits, no thyromegaly LYMPHATICS:  No cervical, inguinal adenopathy LUNGS:  Clear to auscultation bilaterally BACK:  No CVA tenderness CHEST:  Unremarkable HEART:  PMI not displaced or sustained,S1 and S2 within normal limits, no S3, no S4, no clicks, no rubs, 2 out of 6 early peaking apical systolic murmur, no diastolic murmurs ABD:  Flat, positive bowel sounds normal in frequency in pitch, no bruits, no rebound, no guarding, no midline pulsatile mass, no hepatomegaly, no splenomegaly EXT:  2 plus pulses throughout, no  edema, no cyanosis no clubbing SKIN:  No rashes no nodules NEURO:  Cranial nerves II through XII grossly intact, motor grossly intact throughout PSYCH:  Cognitively intact, oriented to person place and time    EKG:  EKG is ordered today. The ekg ordered today demonstrates sinus rhythm, rate 68, right bundle branch block, no change from the EKG in June.   Recent Labs: 03/27/2015: ALT 37*; BUN 17; Creatinine, Ser 0.66; Hemoglobin 13.4; Potassium 4.4; Sodium 140; TSH 2.870    Lipid Panel    Component Value Date/Time   CHOL 194 03/27/2015 1427   TRIG 58 03/27/2015 1427   TRIG 32* 06/03/2012   HDL 87 03/27/2015 1427   HDL 75* 06/03/2012   LDLCALC 90 05/10/2014 0917   LDLCALC 98 06/03/2012      Wt Readings from Last 3 Encounters:  07/07/15 198 lb (89.812 kg)  04/07/15 185 lb (83.915 kg)  03/31/15 188 lb (85.276 kg)      Other studies Reviewed: Additional studies/ records that were reviewed today include: Labs, primary care office note. Review of the above records demonstrates:  Please see elsewhere in the note.     ASSESSMENT AND PLAN:  SOB:  I will bring the patient back for a POET (Plain Old Exercise Test). This will allow me to screen for obstructive coronary disease, risk stratify and very importantly provide a prescription for exercise.  MURMUR/MVP:  I will follow-up with an echocardiogram. Further evaluation based on these results.   Current medicines are reviewed at length with the patient today.  The patient does not have concerns regarding medicines.  The following changes have been made:  no change  Labs/ tests ordered today include:   Orders Placed This Encounter  Procedures  . EKG 12-Lead  . ECHOCARDIOGRAM COMPLETE     Disposition:   FU with me as needed.      Signed, Minus Breeding, MD  07/07/2015 12:36 PM    [place]

## 2015-07-07 NOTE — Patient Instructions (Signed)
Your physician recommends that you schedule a follow-up appointment in: As Needed  Your physician has requested that you have an echocardiogram. Echocardiography is a painless test that uses sound waves to create images of your heart. It provides your doctor with information about the size and shape of your heart and how well your heart's chambers and valves are working. This procedure takes approximately one hour. There are no restrictions for this procedure.     

## 2015-07-18 ENCOUNTER — Other Ambulatory Visit (HOSPITAL_COMMUNITY): Payer: PRIVATE HEALTH INSURANCE

## 2015-07-19 ENCOUNTER — Other Ambulatory Visit: Payer: Self-pay

## 2015-07-19 ENCOUNTER — Ambulatory Visit (HOSPITAL_COMMUNITY): Payer: Medicare Other | Attending: Cardiology

## 2015-07-19 DIAGNOSIS — E785 Hyperlipidemia, unspecified: Secondary | ICD-10-CM | POA: Diagnosis not present

## 2015-07-19 DIAGNOSIS — I5189 Other ill-defined heart diseases: Secondary | ICD-10-CM | POA: Diagnosis not present

## 2015-07-19 DIAGNOSIS — I341 Nonrheumatic mitral (valve) prolapse: Secondary | ICD-10-CM

## 2015-07-19 DIAGNOSIS — R011 Cardiac murmur, unspecified: Secondary | ICD-10-CM | POA: Diagnosis not present

## 2015-07-19 DIAGNOSIS — I340 Nonrheumatic mitral (valve) insufficiency: Secondary | ICD-10-CM | POA: Insufficient documentation

## 2015-07-19 DIAGNOSIS — I517 Cardiomegaly: Secondary | ICD-10-CM | POA: Diagnosis not present

## 2015-07-31 ENCOUNTER — Telehealth: Payer: Self-pay | Admitting: Cardiology

## 2015-07-31 NOTE — Telephone Encounter (Signed)
New message    Patient has question on echo results.

## 2015-07-31 NOTE — Telephone Encounter (Signed)
Returned call to patient.She stated she reviewed echo results on mychart.She was calling to make sure heart function ok.Advised EF 55 to 60 % which is normal.No MVP.

## 2015-08-14 ENCOUNTER — Other Ambulatory Visit (INDEPENDENT_AMBULATORY_CARE_PROVIDER_SITE_OTHER): Payer: Medicare Other

## 2015-08-14 DIAGNOSIS — E559 Vitamin D deficiency, unspecified: Secondary | ICD-10-CM | POA: Diagnosis not present

## 2015-08-14 DIAGNOSIS — E785 Hyperlipidemia, unspecified: Secondary | ICD-10-CM | POA: Diagnosis not present

## 2015-08-14 DIAGNOSIS — IMO0001 Reserved for inherently not codable concepts without codable children: Secondary | ICD-10-CM

## 2015-08-14 DIAGNOSIS — R030 Elevated blood-pressure reading, without diagnosis of hypertension: Secondary | ICD-10-CM

## 2015-08-14 NOTE — Progress Notes (Signed)
Lab only 

## 2015-08-15 LAB — HEPATIC FUNCTION PANEL
ALT: 35 IU/L — ABNORMAL HIGH (ref 0–32)
AST: 25 IU/L (ref 0–40)
Albumin: 4.5 g/dL (ref 3.5–4.8)
Alkaline Phosphatase: 69 IU/L (ref 39–117)
Bilirubin Total: 0.6 mg/dL (ref 0.0–1.2)
Bilirubin, Direct: 0.17 mg/dL (ref 0.00–0.40)
Total Protein: 6.4 g/dL (ref 6.0–8.5)

## 2015-08-15 LAB — CBC WITH DIFFERENTIAL/PLATELET
Basophils Absolute: 0 10*3/uL (ref 0.0–0.2)
Basos: 1 %
EOS (ABSOLUTE): 0.2 10*3/uL (ref 0.0–0.4)
Eos: 5 %
Hematocrit: 40.6 % (ref 34.0–46.6)
Hemoglobin: 13.6 g/dL (ref 11.1–15.9)
Immature Grans (Abs): 0 10*3/uL (ref 0.0–0.1)
Immature Granulocytes: 0 %
Lymphocytes Absolute: 1.2 10*3/uL (ref 0.7–3.1)
Lymphs: 30 %
MCH: 31.7 pg (ref 26.6–33.0)
MCHC: 33.5 g/dL (ref 31.5–35.7)
MCV: 95 fL (ref 79–97)
Monocytes Absolute: 0.5 10*3/uL (ref 0.1–0.9)
Monocytes: 11 %
Neutrophils Absolute: 2.1 10*3/uL (ref 1.4–7.0)
Neutrophils: 53 %
Platelets: 242 10*3/uL (ref 150–379)
RBC: 4.29 x10E6/uL (ref 3.77–5.28)
RDW: 13.9 % (ref 12.3–15.4)
WBC: 4 10*3/uL (ref 3.4–10.8)

## 2015-08-15 LAB — NMR, LIPOPROFILE
Cholesterol: 174 mg/dL (ref 100–199)
HDL Cholesterol by NMR: 85 mg/dL (ref >39)
HDL Particle Number: 40.7 umol/L (ref >=30.5)
LDL Particle Number: 759 nmol/L (ref <1000)
LDL Size: 21 nm (ref >20.5)
LDL-C: 78 mg/dL (ref 0–99)
LP-IR Score: <25 (ref <=45)
Small LDL Particle Number: <90 nmol/L (ref <=527)
Triglycerides by NMR: 57 mg/dL (ref 0–149)

## 2015-08-15 LAB — BMP8+EGFR
BUN/Creatinine Ratio: 33 — ABNORMAL HIGH (ref 11–26)
BUN: 19 mg/dL (ref 8–27)
CO2: 26 mmol/L (ref 18–29)
Calcium: 9.9 mg/dL (ref 8.7–10.3)
Chloride: 101 mmol/L (ref 97–106)
Creatinine, Ser: 0.58 mg/dL (ref 0.57–1.00)
GFR calc Af Amer: 107 mL/min/{1.73_m2} (ref >59)
GFR calc non Af Amer: 93 mL/min/{1.73_m2} (ref >59)
Glucose: 101 mg/dL — ABNORMAL HIGH (ref 65–99)
Potassium: 4.1 mmol/L (ref 3.5–5.2)
Sodium: 141 mmol/L (ref 136–144)

## 2015-08-15 LAB — VITAMIN D 25 HYDROXY (VIT D DEFICIENCY, FRACTURES): Vit D, 25-Hydroxy: 50.9 ng/mL (ref 30.0–100.0)

## 2015-08-18 ENCOUNTER — Encounter: Payer: Self-pay | Admitting: Family Medicine

## 2015-08-18 ENCOUNTER — Ambulatory Visit (INDEPENDENT_AMBULATORY_CARE_PROVIDER_SITE_OTHER): Payer: Medicare Other | Admitting: Family Medicine

## 2015-08-18 VITALS — BP 132/86 | HR 62 | Temp 98.0°F | Ht 68.0 in | Wt 190.0 lb

## 2015-08-18 DIAGNOSIS — R030 Elevated blood-pressure reading, without diagnosis of hypertension: Secondary | ICD-10-CM | POA: Diagnosis not present

## 2015-08-18 DIAGNOSIS — M79604 Pain in right leg: Secondary | ICD-10-CM

## 2015-08-18 DIAGNOSIS — I341 Nonrheumatic mitral (valve) prolapse: Secondary | ICD-10-CM | POA: Diagnosis not present

## 2015-08-18 DIAGNOSIS — F419 Anxiety disorder, unspecified: Secondary | ICD-10-CM

## 2015-08-18 DIAGNOSIS — E785 Hyperlipidemia, unspecified: Secondary | ICD-10-CM | POA: Diagnosis not present

## 2015-08-18 DIAGNOSIS — E559 Vitamin D deficiency, unspecified: Secondary | ICD-10-CM

## 2015-08-18 DIAGNOSIS — M25571 Pain in right ankle and joints of right foot: Secondary | ICD-10-CM

## 2015-08-18 DIAGNOSIS — Z23 Encounter for immunization: Secondary | ICD-10-CM | POA: Diagnosis not present

## 2015-08-18 DIAGNOSIS — IMO0001 Reserved for inherently not codable concepts without codable children: Secondary | ICD-10-CM

## 2015-08-18 NOTE — Patient Instructions (Addendum)
Medicare Annual Wellness Visit  [facility] and the medical providers at [facility] strive to bring you the best medical care.  In doing so we not only want to address your current medical conditions and concerns but also to detect new conditions early and prevent illness, disease and health-related problems.    Medicare offers a yearly Wellness Visit which allows our clinical staff to assess your need for preventative services including immunizations, lifestyle education, counseling to decrease risk of preventable diseases and screening for fall risk and other medical concerns.    This visit is provided free of charge (no copay) for all Medicare recipients. The clinical pharmacists at [facility] have begun to conduct these Wellness Visits which will also include a thorough review of all your medications.    As you primary medical provider recommend that you make an appointment for your Annual Wellness Visit if you have not done so already this year.  You may set up this appointment before you leave today or you may call back ([phone]) and schedule an appointment.  Please make sure when you call that you mention that you are scheduling your Annual Wellness Visit with the clinical pharmacist so that the appointment may be made for the proper length of time.     Continue current medications. Continue good therapeutic lifestyle changes which include good diet and exercise. Fall precautions discussed with patient. If an FOBT was given today- please return it to our front desk. If you are over 24 years old - you may need Prevnar 15 or the adult Pneumonia vaccine.  **Flu shots are available--- please call and schedule a FLU-CLINIC appointment**  After your visit with [place] today you will receive a survey in the mail or online from [employer] regarding your care with [place]. Please take a moment to fill this out. Your feedback is very  important to [place] as you can help [place] better understand your patient needs as well as improve your experience and satisfaction. WE CARE ABOUT YOU!!!   The patient was instructed to try taking Aleve 2 twice daily after breakfast and supper for a couple weeks to see if this made a difference with the pain and inflammation in her right foot and ankle. She was also reminded that this can irritate the stomach and if it did do this that she should stop the Aleve. She should wear some support especially if she is riding her horse or walking on irregular surfaces. Otherwise do not wear any support. If the pain continues we would recommend that she reschedule a visit with her orthopedic surgeon, Dr. Doran Durand.

## 2015-08-18 NOTE — Progress Notes (Signed)
Subjective:    Patient ID: Kelly George, female    DOB: [birth year], 69 y.o.   MRN: [id number]  HPI Pt here for follow up and management of chronic medical problems which includes hyperlipidemia. She is taking medications regularly. The patient comes to the visit today and has no specific complaints. She is due to get her flu shot and this will be given today. She will be given an FOBT to return. Her recent lab work was reviewed with her and all of this was excellent.The CBC has a normal white blood cell count. The hemoglobin is good at 13.6 and the platelet count is adequate. The blood sugar slightly elevated at 101. The creatinine, the most important kidney function test is within normal limits. The electrolytes including potassium are good. 1 liver function test is slightly elevated but less than in the past and all other wounds are within normal limits All cholesterol numbers with advanced lipid testing are excellent and at goal and the patient should continue with current treatment and aggressive therapeutic lifestyle changes. This is definitely improved from 4 months ago and we are pleased with that. The vitamin D level is good at 50.9 patient should continue with current treatment The patient denies chest pain shortness of breath trouble swallowing heartburn indigestion nausea vomiting diarrhea or blood in the stool. 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 She does recall past injury to the foot. She says that taking 2 Aleve once a day do give her some relief.     Patient Active Problem List   Diagnosis Date Noted  . Precordial chest pain 07/07/2015  . Pain in joint of right foot 05/16/2014  . Vitamin D deficiency 07/08/2013  . Mitral valve prolapse syndrome, history of mild 01/05/2013  . Seasonal allergies   . Anxiety   . Depression   . Hyperlipidemia     Outpatient Encounter Prescriptions as of 08/18/2015  Medication Sig  . ALPRAZolam (XANAX) 0.25 MG tablet Take 1 tablet (0.25 mg total) by mouth at bedtime as needed for sleep.  . calcium-vitamin D (OSCAL WITH D) 500-200 MG-UNIT per tablet Take 1 tablet by mouth daily.  . cetirizine (ZYRTEC) 10 MG tablet Take 10 mg by mouth daily.  . Cholecalciferol (VITAMIN D3) 2000 UNITS TABS Take 5,000 Units by mouth daily.   . fish oil-omega-3 fatty acids 1000 MG capsule Take 1,200 mg by mouth daily.   Marland Kitchen glucosamine-chondroitin 500-400 MG tablet Take 1 tablet by mouth 2 (two) times daily.   . meloxicam (MOBIC) 15 MG tablet 15 mg daily as needed for pain.  . naproxen sodium (ANAPROX) 220 MG tablet Take 220 mg by mouth daily. TAKE TWO TABLETS ONCE DAILY  . simvastatin (ZOCOR) 10 MG tablet Take 1 tablet (10 mg total) by mouth at bedtime.   No facility-administered encounter medications on file as of 08/18/2015.      Review of Systems  Constitutional: Negative.   HENT: Negative.   Eyes: Negative.   Respiratory: Negative.   Cardiovascular: Negative.   Gastrointestinal: Negative.   Endocrine: Negative.   Genitourinary: Negative.   Musculoskeletal: Negative.   Skin: Negative.   Allergic/Immunologic: Negative.   Neurological: Negative.   Hematological: Negative.   Psychiatric/Behavioral: Negative.        Objective:   Physical Exam  Constitutional: She is oriented to person, place, and  time. She appears well-developed and well-nourished. No distress.  HENT:  Head: Normocephalic and atraumatic.  Right Ear: External ear normal.  Left Ear: External ear normal.  Nose: Nose normal.  Mouth/Throat: Oropharynx is clear and moist.  Eyes: Conjunctivae and EOM are normal. Pupils are equal, round, and reactive to light. Right eye exhibits no discharge. Left eye exhibits no discharge. No scleral icterus.  Neck: Normal range of motion. Neck supple. No thyromegaly present.  Cardiovascular: Normal rate,  regular rhythm and intact distal pulses.   Murmur heard. There is a grade 2/6 systolic ejection murmur At 72/m  Pulmonary/Chest: Effort normal and breath sounds normal. No respiratory distress. She has no wheezes. She has no rales. She exhibits no tenderness.  Clear anteriorly and posteriorly  Abdominal: Soft. Bowel sounds are normal. She exhibits no mass. There is no tenderness. There is no rebound and no guarding.  Soft without masses tenderness or organ enlargement  Musculoskeletal: She exhibits tenderness. She exhibits no edema.  The right foot and ankle are slightly swollen compared to the left. There is no rubor. Flexion and extension were good without discomfort but turning the foot from one side to the other . When the foot is inverted or everted there was more discomfort.  Lymphadenopathy:    She has no cervical adenopathy.  Neurological: She is alert and oriented to person, place, and time. She has normal reflexes. No cranial nerve deficit.  Skin: Skin is warm and dry. No rash noted.  Psychiatric: She has a normal mood and affect. Her behavior is normal. Judgment and thought content normal.  Nursing note and vitals reviewed.  BP 132/86 mmHg  Pulse 62  Temp(Src) 98 F (36.7 C) (Oral)  Ht 5\' 8"  (1.727 m)  Wt 190 lb (86.183 kg)  BMI 28.90 kg/m2        Assessment & Plan:  1. Vitamin D deficiency -The vitamin D level was good and she will continue with current treatment  2. Hyperlipidemia -All cholesterol numbers were excellent she will continue with current treatment  3. Elevated blood pressure -The blood pressure today is good and she will continue with watching sodium intake  4. Anxiety -She seems to be calm and relaxed and not as his stressed appearing as she has in the past.  5. Pain of right lower extremity -She has seen the orthopedist and she will try extra Aleve for couple weeks to see if this can help relieve the discomfort and we'll consider recalling the  orthopedist if the problem continues  6. Right ankle pain -As above  7. Mitral valve prolapse -The patient has seen the cardiologist about this.  Patient Instructions                       Medicare Annual Wellness Visit  [facility] and the medical providers at [facility] strive to bring you the best medical care.  In doing so we not only want to address your current medical conditions and concerns but also to detect new conditions early and prevent illness, disease and health-related problems.    Medicare offers a yearly Wellness Visit which allows our clinical staff to assess your need for preventative services including immunizations, lifestyle education, counseling to decrease risk of preventable diseases and screening for fall risk and other medical concerns.    This visit is provided free of charge (no copay) for all Medicare recipients. The clinical pharmacists at [facility] have begun to  conduct these Wellness Visits which will also include a thorough review of all your medications.    As you primary medical provider recommend that you make an appointment for your Annual Wellness Visit if you have not done so already this year.  You may set up this appointment before you leave today or you may call back ([phone]) and schedule an appointment.  Please make sure when you call that you mention that you are scheduling your Annual Wellness Visit with the clinical pharmacist so that the appointment may be made for the proper length of time.     Continue current medications. Continue good therapeutic lifestyle changes which include good diet and exercise. Fall precautions discussed with patient. If an FOBT was given today- please return it to our front desk. If you are over 11 years old - you may need Prevnar 19 or the adult Pneumonia vaccine.  **Flu shots are available--- please call and schedule a FLU-CLINIC appointment**  After your  visit with [place] today you will receive a survey in the mail or online from [employer] regarding your care with [place]. Please take a moment to fill this out. Your feedback is very important to [place] as you can help [place] better understand your patient needs as well as improve your experience and satisfaction. WE CARE ABOUT YOU!!!   The patient was instructed to try taking Aleve 2 twice daily after breakfast and supper for a couple weeks to see if this made a difference with the pain and inflammation in her right foot and ankle. She was also reminded that this can irritate the stomach and if it did do this that she should stop the Aleve. She should wear some support especially if she is riding her horse or walking on irregular surfaces. Otherwise do not wear any support. If the pain continues we would recommend that she reschedule a visit with her orthopedic surgeon, Dr. Doran Durand.   Arrie Senate MD

## 2015-09-04 ENCOUNTER — Other Ambulatory Visit: Payer: Medicare Other

## 2015-09-04 DIAGNOSIS — Z1212 Encounter for screening for malignant neoplasm of rectum: Secondary | ICD-10-CM | POA: Diagnosis not present

## 2015-09-10 LAB — FECAL OCCULT BLOOD, IMMUNOCHEMICAL: Fecal Occult Bld: NEGATIVE

## 2015-10-06 ENCOUNTER — Other Ambulatory Visit: Payer: Self-pay | Admitting: Family Medicine

## 2015-12-14 DIAGNOSIS — Z85828 Personal history of other malignant neoplasm of skin: Secondary | ICD-10-CM | POA: Diagnosis not present

## 2015-12-14 DIAGNOSIS — L812 Freckles: Secondary | ICD-10-CM | POA: Diagnosis not present

## 2015-12-14 DIAGNOSIS — L821 Other seborrheic keratosis: Secondary | ICD-10-CM | POA: Diagnosis not present

## 2015-12-14 DIAGNOSIS — D485 Neoplasm of uncertain behavior of skin: Secondary | ICD-10-CM | POA: Diagnosis not present

## 2015-12-14 DIAGNOSIS — D235 Other benign neoplasm of skin of trunk: Secondary | ICD-10-CM | POA: Diagnosis not present

## 2015-12-29 ENCOUNTER — Other Ambulatory Visit: Payer: Medicare Other

## 2015-12-29 DIAGNOSIS — E559 Vitamin D deficiency, unspecified: Secondary | ICD-10-CM | POA: Diagnosis not present

## 2015-12-29 DIAGNOSIS — E785 Hyperlipidemia, unspecified: Secondary | ICD-10-CM

## 2015-12-29 DIAGNOSIS — R030 Elevated blood-pressure reading, without diagnosis of hypertension: Secondary | ICD-10-CM

## 2015-12-29 DIAGNOSIS — IMO0001 Reserved for inherently not codable concepts without codable children: Secondary | ICD-10-CM

## 2015-12-30 LAB — CBC WITH DIFFERENTIAL/PLATELET
Basophils Absolute: 0 10*3/uL (ref 0.0–0.2)
Basos: 1 %
EOS (ABSOLUTE): 0.3 10*3/uL (ref 0.0–0.4)
Eos: 7 %
Hematocrit: 42.8 % (ref 34.0–46.6)
Hemoglobin: 14 g/dL (ref 11.1–15.9)
Immature Grans (Abs): 0 10*3/uL (ref 0.0–0.1)
Immature Granulocytes: 0 %
Lymphocytes Absolute: 1.1 10*3/uL (ref 0.7–3.1)
Lymphs: 29 %
MCH: 31.4 pg (ref 26.6–33.0)
MCHC: 32.7 g/dL (ref 31.5–35.7)
MCV: 96 fL (ref 79–97)
Monocytes Absolute: 0.4 10*3/uL (ref 0.1–0.9)
Monocytes: 11 %
Neutrophils Absolute: 2 10*3/uL (ref 1.4–7.0)
Neutrophils: 52 %
Platelets: 240 10*3/uL (ref 150–379)
RBC: 4.46 x10E6/uL (ref 3.77–5.28)
RDW: 13.9 % (ref 12.3–15.4)
WBC: 3.9 10*3/uL (ref 3.4–10.8)

## 2015-12-30 LAB — NMR, LIPOPROFILE
Cholesterol: 172 mg/dL (ref 100–199)
HDL Cholesterol by NMR: 77 mg/dL (ref >39)
HDL Particle Number: 43.1 umol/L (ref >=30.5)
LDL Particle Number: 910 nmol/L (ref <1000)
LDL Size: 21.4 nm (ref >20.5)
LDL-C: 80 mg/dL (ref 0–99)
LP-IR Score: <25 (ref <=45)
Small LDL Particle Number: <90 nmol/L (ref <=527)
Triglycerides by NMR: 76 mg/dL (ref 0–149)

## 2015-12-30 LAB — HEPATIC FUNCTION PANEL
ALT: 49 IU/L — ABNORMAL HIGH (ref 0–32)
AST: 41 IU/L — ABNORMAL HIGH (ref 0–40)
Albumin: 4.4 g/dL (ref 3.5–4.8)
Alkaline Phosphatase: 81 IU/L (ref 39–117)
Bilirubin Total: 0.6 mg/dL (ref 0.0–1.2)
Bilirubin, Direct: 0.18 mg/dL (ref 0.00–0.40)
Total Protein: 6.7 g/dL (ref 6.0–8.5)

## 2015-12-30 LAB — BMP8+EGFR
BUN/Creatinine Ratio: 36 — ABNORMAL HIGH (ref 11–26)
BUN: 22 mg/dL (ref 8–27)
CO2: 27 mmol/L (ref 18–29)
Calcium: 9.5 mg/dL (ref 8.7–10.3)
Chloride: 101 mmol/L (ref 96–106)
Creatinine, Ser: 0.61 mg/dL (ref 0.57–1.00)
GFR calc Af Amer: 105 mL/min/{1.73_m2} (ref >59)
GFR calc non Af Amer: 92 mL/min/{1.73_m2} (ref >59)
Glucose: 99 mg/dL (ref 65–99)
Potassium: 5 mmol/L (ref 3.5–5.2)
Sodium: 141 mmol/L (ref 134–144)

## 2015-12-30 LAB — VITAMIN D 25 HYDROXY (VIT D DEFICIENCY, FRACTURES): Vit D, 25-Hydroxy: 55.9 ng/mL (ref 30.0–100.0)

## 2016-01-01 DIAGNOSIS — H1045 Other chronic allergic conjunctivitis: Secondary | ICD-10-CM | POA: Diagnosis not present

## 2016-01-01 DIAGNOSIS — H5203 Hypermetropia, bilateral: Secondary | ICD-10-CM | POA: Diagnosis not present

## 2016-01-01 DIAGNOSIS — H524 Presbyopia: Secondary | ICD-10-CM | POA: Diagnosis not present

## 2016-01-01 DIAGNOSIS — H2513 Age-related nuclear cataract, bilateral: Secondary | ICD-10-CM | POA: Diagnosis not present

## 2016-01-01 DIAGNOSIS — H52223 Regular astigmatism, bilateral: Secondary | ICD-10-CM | POA: Diagnosis not present

## 2016-01-05 ENCOUNTER — Other Ambulatory Visit: Payer: Self-pay

## 2016-01-05 ENCOUNTER — Ambulatory Visit (INDEPENDENT_AMBULATORY_CARE_PROVIDER_SITE_OTHER): Payer: Medicare Other | Admitting: Family Medicine

## 2016-01-05 ENCOUNTER — Encounter: Payer: Self-pay | Admitting: Family Medicine

## 2016-01-05 VITALS — BP 129/76 | HR 59 | Temp 97.2°F | Ht 68.0 in | Wt 189.0 lb

## 2016-01-05 DIAGNOSIS — R945 Abnormal results of liver function studies: Secondary | ICD-10-CM

## 2016-01-05 DIAGNOSIS — R030 Elevated blood-pressure reading, without diagnosis of hypertension: Secondary | ICD-10-CM | POA: Diagnosis not present

## 2016-01-05 DIAGNOSIS — J302 Other seasonal allergic rhinitis: Secondary | ICD-10-CM | POA: Diagnosis not present

## 2016-01-05 DIAGNOSIS — M79671 Pain in right foot: Secondary | ICD-10-CM

## 2016-01-05 DIAGNOSIS — Z1231 Encounter for screening mammogram for malignant neoplasm of breast: Secondary | ICD-10-CM

## 2016-01-05 DIAGNOSIS — E559 Vitamin D deficiency, unspecified: Secondary | ICD-10-CM

## 2016-01-05 DIAGNOSIS — E785 Hyperlipidemia, unspecified: Secondary | ICD-10-CM | POA: Diagnosis not present

## 2016-01-05 DIAGNOSIS — R7989 Other specified abnormal findings of blood chemistry: Secondary | ICD-10-CM | POA: Diagnosis not present

## 2016-01-05 DIAGNOSIS — I341 Nonrheumatic mitral (valve) prolapse: Secondary | ICD-10-CM | POA: Diagnosis not present

## 2016-01-05 DIAGNOSIS — IMO0001 Reserved for inherently not codable concepts without codable children: Secondary | ICD-10-CM

## 2016-01-05 NOTE — Progress Notes (Signed)
Subjective:    Patient ID: Kelly George, female    DOB: 1946-09-09, 70 y.o.   MRN: [id number]  HPI Pt here for follow up and management of chronic medical problems which includes hyperlipidemia and elevated BP. She is taking medications regularly.The patient still continues to have right foot pain and has seen several orthopedists about this. She is dealing with arthritis and synovitis. This is been going on for several years. She attributes this to her tennis playing. She does wear some support and her shoes. Right now it is doing somewhat better. She has to be careful how she walks. She denies any chest pain or palpitation. She denies any shortness of breath trouble swallowing heartburn indigestion nausea vomiting diarrhea or blood in the stool. She is passing her water without problems. She does not have any headaches blurred vision or dizziness.     Patient Active Problem List   Diagnosis Date Noted  . Precordial chest pain 07/07/2015  . Pain in joint of right foot 05/16/2014  . Vitamin D deficiency 07/08/2013  . Mitral valve prolapse syndrome, history of mild 01/05/2013  . Seasonal allergies   . Anxiety   . Depression   . Hyperlipidemia    Outpatient Encounter Prescriptions as of 01/05/2016  Medication Sig  . ALPRAZolam (XANAX) 0.25 MG tablet Take 1 tablet (0.25 mg total) by mouth at bedtime as needed for sleep.  . calcium-vitamin D (OSCAL WITH D) 500-200 MG-UNIT per tablet Take 1 tablet by mouth daily.  . Cholecalciferol (VITAMIN D3) 2000 UNITS TABS Take 5,000 Units by mouth daily.   . fish oil-omega-3 fatty acids 1000 MG capsule Take 1,200 mg by mouth daily.   Marland Kitchen glucosamine-chondroitin 500-400 MG tablet Take 1 tablet by mouth 2 (two) times daily.   . meloxicam (MOBIC) 15 MG tablet 15 mg daily as needed for pain.  . naproxen sodium (ANAPROX) 220 MG tablet Take 220 mg by mouth daily. TAKE TWO TABLETS ONCE DAILY  . simvastatin (ZOCOR) 10 MG tablet TAKE 1 TABLET BY MOUTH AT  BEDTIME  . cetirizine (ZYRTEC) 10 MG tablet Take 10 mg by mouth daily. Reported on 01/05/2016   No facility-administered encounter medications on file as of 01/05/2016.      Review of Systems  Constitutional: Negative.   HENT: Negative.   Eyes: Negative.   Respiratory: Negative.   Cardiovascular: Negative.   Gastrointestinal: Negative.   Endocrine: Negative.   Genitourinary: Negative.   Musculoskeletal: Positive for arthralgias (right foot -- unsteady gait).  Skin: Negative.   Allergic/Immunologic: Negative.   Neurological: Negative.   Hematological: Negative.   Psychiatric/Behavioral: Negative.        Objective:   Physical Exam  Constitutional: She is oriented to person, place, and time. She appears well-developed and well-nourished. No distress.  HENT:  Head: Normocephalic and atraumatic.  Right Ear: External ear normal.  Left Ear: External ear normal.  Nose: Nose normal.  Mouth/Throat: Oropharynx is clear and moist.  Eyes: Conjunctivae and EOM are normal. Pupils are equal, round, and reactive to light. Right eye exhibits no discharge. Left eye exhibits no discharge. No scleral icterus.  Neck: Normal range of motion. Neck supple. No thyromegaly present.  Cardiovascular: Normal rate, regular rhythm, normal heart sounds and intact distal pulses.   No murmur heard. At 72/m  Pulmonary/Chest: Effort normal and breath sounds normal. No respiratory distress. She has no wheezes. She has no rales. She exhibits no tenderness.  Abdominal: Soft. Bowel sounds are normal. She exhibits no  mass. There is no tenderness. There is no rebound and no guarding.  Musculoskeletal: Normal range of motion. She exhibits edema. She exhibits no tenderness.  Slight swelling in the right foot  Lymphadenopathy:    She has no cervical adenopathy.  Neurological: She is alert and oriented to person, place, and time. She has normal reflexes. No cranial nerve deficit.  Skin: Skin is warm and dry. No rash  noted.  Psychiatric: She has a normal mood and affect. Her behavior is normal. Judgment and thought content normal.  Nursing note and vitals reviewed.  BP 129/76 mmHg  Pulse 59  Temp(Src) 97.2 F (36.2 C) (Oral)  Ht 5\' 8"  (1.727 m)  Wt 189 lb (85.73 kg)  BMI 28.74 kg/m2        Assessment & Plan:  1. Hyperlipidemia -Cholesterol numbers were good and patient should continue with current treatment  2. Vitamin D deficiency -The vitamin D level was good and she should continue with current treatment  3. Elevated blood pressure -The blood pressure is good today and she should continue watching her salt intake.  4. Mitral valve prolapse syndrome, history of mild -No issues with chest pain.  5. Seasonal allergies -Use Flonase and nasal saline  6. Right foot pain -Continue follow-up with orthopedist as needed  7. Elevated liver function tests -Schedule abdominal ultrasound and pelvic ultrasound and repeat liver function tests in 4 weeks  Patient Instructions                       Medicare Annual Wellness Visit  [facility] and the medical providers at [facility] strive to bring you the best medical care.  In doing so we not only want to address your current medical conditions and concerns but also to detect new conditions early and prevent illness, disease and health-related problems.    Medicare offers a yearly Wellness Visit which allows our clinical staff to assess your need for preventative services including immunizations, lifestyle education, counseling to decrease risk of preventable diseases and screening for fall risk and other medical concerns.    This visit is provided free of charge (no copay) for all Medicare recipients. The clinical pharmacists at [facility] have begun to conduct these Wellness Visits which will also include a thorough review of all your medications.    As you primary medical provider recommend that  you make an appointment for your Annual Wellness Visit if you have not done so already this year.  You may set up this appointment before you leave today or you may call back [id number] and schedule an appointment.  Please make sure when you call that you mention that you are scheduling your Annual Wellness Visit with the clinical pharmacist so that the appointment may be made for the proper length of time.     Continue current medications. Continue good therapeutic lifestyle changes which include good diet and exercise. Fall precautions discussed with patient. If an FOBT was given today- please return it to our front desk. If you are over 38 years old - you may need Prevnar 56 or the adult Pneumonia vaccine.  **Flu shots are available--- please call and schedule a FLU-CLINIC appointment**  After your visit with [place] today you will receive a survey in the mail or online from [employer] regarding your care with [place]. Please take a moment to fill this out. Your feedback is very important to [place] as you can help [place]  better understand your patient needs as well as improve your experience and satisfaction. WE CARE ABOUT YOU!!!   As far as the unsteady gait, please follow this more closely and check your heart rate, see if it's happening when he changes positions from turning from one side to the other are just walking straight. See if it happens when you're still. If problems continue we will consider doing a 24-hour monitor. Stay is active as your foot will let you stay active Wear good support and firm support for both feet. If the foot pain continues please call us and we will make another referral if you desire. We will arrange for you to come back and have a pelvic exam with one of our providers Continue to follow your diet as closely as possible and take your current medications We will schedule an ultrasound of the abdomen and pelvis because of the elevated liver function tests Return to clinic in  about 4 weeks and repeat the liver function test, you do not have to be fasting.   Arrie Senate MD

## 2016-01-05 NOTE — Addendum Note (Signed)
Addended by: Zannie Cove on: 01/05/2016 11:02 AM   Modules accepted: Orders

## 2016-01-05 NOTE — Patient Instructions (Addendum)
Medicare Annual Wellness Visit  [facility] and the medical providers at [facility] strive to bring you the best medical care.  In doing so we not only want to address your current medical conditions and concerns but also to detect new conditions early and prevent illness, disease and health-related problems.    Medicare offers a yearly Wellness Visit which allows our clinical staff to assess your need for preventative services including immunizations, lifestyle education, counseling to decrease risk of preventable diseases and screening for fall risk and other medical concerns.    This visit is provided free of charge (no copay) for all Medicare recipients. The clinical pharmacists at [facility] have begun to conduct these Wellness Visits which will also include a thorough review of all your medications.    As you primary medical provider recommend that you make an appointment for your Annual Wellness Visit if you have not done so already this year.  You may set up this appointment before you leave today or you may call back [phone] and schedule an appointment.  Please make sure when you call that you mention that you are scheduling your Annual Wellness Visit with the clinical pharmacist so that the appointment may be made for the proper length of time.     Continue current medications. Continue good therapeutic lifestyle changes which include good diet and exercise. Fall precautions discussed with patient. If an FOBT was given today- please return it to our front desk. If you are over 66 years old - you may need Prevnar 73 or the adult Pneumonia vaccine.  **Flu shots are available--- please call and schedule a FLU-CLINIC appointment**  After your visit with [place] today you will receive a survey in the mail or online from [employer] regarding your care with [place]. Please take a moment to fill this out. Your feedback is very  important to [place] as you can help [place] better understand your patient needs as well as improve your experience and satisfaction. WE CARE ABOUT YOU!!!   As far as the unsteady gait, please follow this more closely and check your heart rate, see if it's happening when he changes positions from turning from one side to the other are just walking straight. See if it happens when you're still. If problems continue we will consider doing a 24-hour monitor. Stay is active as your foot will let you stay active Wear good support and firm support for both feet. If the foot pain continues please call us and we will make another referral if you desire. We will arrange for you to come back and have a pelvic exam with one of our providers Continue to follow your diet as closely as possible and take your current medications We will schedule an ultrasound of the abdomen and pelvis because of the elevated liver function tests Return to clinic in about 4 weeks and repeat the liver function test, you do not have to be fasting.

## 2016-01-08 ENCOUNTER — Ambulatory Visit (INDEPENDENT_AMBULATORY_CARE_PROVIDER_SITE_OTHER): Payer: Medicare Other | Admitting: Pediatrics

## 2016-01-08 ENCOUNTER — Encounter: Payer: Self-pay | Admitting: Pediatrics

## 2016-01-08 ENCOUNTER — Other Ambulatory Visit: Payer: Self-pay | Admitting: Family Medicine

## 2016-01-08 VITALS — BP 135/84 | HR 61 | Temp 97.7°F | Ht 68.0 in | Wt 193.0 lb

## 2016-01-08 DIAGNOSIS — Z124 Encounter for screening for malignant neoplasm of cervix: Secondary | ICD-10-CM

## 2016-01-08 DIAGNOSIS — Z1239 Encounter for other screening for malignant neoplasm of breast: Secondary | ICD-10-CM

## 2016-01-08 DIAGNOSIS — Z1151 Encounter for screening for human papillomavirus (HPV): Secondary | ICD-10-CM | POA: Diagnosis not present

## 2016-01-08 DIAGNOSIS — Z01419 Encounter for gynecological examination (general) (routine) without abnormal findings: Secondary | ICD-10-CM

## 2016-01-08 NOTE — Progress Notes (Signed)
    Subjective:    Patient ID: Kelly George, female    DOB: [birth year], 70 y.o.   MRN: [id number]  CC: Gynecologic Exam   HPI: Kelly George is a 70 y.o. female presenting for Gynecologic Exam  No vaginal bleeding or discharge No h/o abnormal pap smears Last done in 2012 Feeling well overall Due for mammogram Appetite normal No abd pain No symptoms of vaginal atrophy, no irritation    Depression screen [facility] 2/9 01/08/2016 01/05/2016 08/18/2015 03/31/2015 09/16/2014  Decreased Interest 0 0 0 0 0  Down, Depressed, Hopeless 0 0 0 0 0  PHQ - 2 Score 0 0 0 0 0     Relevant past medical, surgical, family and social history reviewed and updated as indicated. Interim medical history since our last visit reviewed. Allergies and medications reviewed and updated.    ROS: Per HPI unless specifically indicated above  History  Smoking status  . Never Smoker   Smokeless tobacco  . Never Used    Past Medical History Patient Active Problem List   Diagnosis Date Noted  . Precordial chest pain 07/07/2015  . Pain in joint of right foot 05/16/2014  . Vitamin D deficiency 07/08/2013  . Mitral valve prolapse syndrome, history of mild 01/05/2013  . Seasonal allergies   . Anxiety   . Depression   . Hyperlipidemia        Objective:    BP 135/84 mmHg  Pulse 61  Temp(Src) 97.7 F (36.5 C) (Oral)  Ht 5\' 8"  (1.727 m)  Wt 193 lb (87.544 kg)  BMI 29.35 kg/m2  Wt Readings from Last 3 Encounters:  01/08/16 193 lb (87.544 kg)  01/05/16 189 lb (85.73 kg)  08/18/15 190 lb (86.183 kg)     Gen: NAD, alert, cooperative with exam, NCAT EYES: EOMI, no scleral injection or icterus ENT:  OP without erythema LYMPH: no cervical LAD CV: NRRR, normal S1/S2, no murmur, distal pulses 2+ b/l Resp: CTABL, no wheezes, normal WOB Abd: +BS, soft, NTND. no guarding or organomegaly Ext: No edema, warm Neuro: Alert and oriented MSK: normal muscle bulk Breast: normal exam b/l GU: slightly  atrophied vaginal tissues, otherwise normal exam     Assessment & Plan:    [name] was seen today for gynecologic exam. If pap smear below normal can stop pap smear screening as pt over 65yo, will have had two pap smears in last ten years that are negative.  Diagnoses and all orders for this visit:  Encounter for gynecological examination -     Pap IG and HPV (high risk) DNA detection  Cervical cancer screening -     Pap IG and HPV (high risk) DNA detection  Breast cancer screening Normal exam Going to schedule mammogram   Follow up plan: Return if symptoms worsen or fail to improve.  Assunta Found, MD [facility] 01/08/2016, 9:23 AM

## 2016-01-10 ENCOUNTER — Encounter: Payer: Self-pay | Admitting: Family Medicine

## 2016-01-10 ENCOUNTER — Ambulatory Visit (INDEPENDENT_AMBULATORY_CARE_PROVIDER_SITE_OTHER): Payer: Medicare Other | Admitting: Family Medicine

## 2016-01-10 VITALS — BP 122/77 | HR 74 | Temp 98.4°F | Ht 68.0 in | Wt 193.0 lb

## 2016-01-10 DIAGNOSIS — S51851A Open bite of right forearm, initial encounter: Secondary | ICD-10-CM

## 2016-01-10 DIAGNOSIS — W5501XA Bitten by cat, initial encounter: Secondary | ICD-10-CM | POA: Diagnosis not present

## 2016-01-10 DIAGNOSIS — L03113 Cellulitis of right upper limb: Secondary | ICD-10-CM | POA: Diagnosis not present

## 2016-01-10 LAB — PAP IG AND HPV HIGH-RISK
HPV, high-risk: NEGATIVE
PAP Smear Comment: 0

## 2016-01-10 MED ORDER — CLARITHROMYCIN ER 500 MG PO TB24: 1000.0000 mg | ORAL_TABLET | Freq: Every day | ORAL | Status: DC

## 2016-01-10 MED ORDER — CLARITHROMYCIN 500 MG PO TABS: 500.0000 mg | ORAL_TABLET | Freq: Two times a day (BID) | ORAL | Status: DC

## 2016-01-10 NOTE — Progress Notes (Signed)
Subjective:  Patient ID: Kelly George, female    DOB: Feb 28, 1946  Age: 70 y.o. MRN: [id number]  CC: Animal Bite   HPI Kelly George presents for bitten by her own cat 3 days ago. Started getting red today. Located distal right forearm. Minimal pain. Cat vaccine is UTD.    History Kelly George has a past medical history of Mitral valve prolapse; Irregular heart beat; Seasonal allergies; Anxiety; Depression; and Hyperlipidemia.   She has past surgical history that includes Colonoscopy (07/07/01); Breast lumpectomy; and Tubal ligation.   Her family history includes Breast cancer in her mother; COPD in her mother; Cancer in her father; Diabetes in her father; Heart attack (age of onset: 60) in her father; Uterine cancer in her maternal aunt.She reports that she has never smoked. She has never used smokeless tobacco. She reports that she drinks about 4.8 oz of alcohol per week. She reports that she does not use illicit drugs.    ROS Review of Systems  Constitutional: Negative for fever, activity change and appetite change.  HENT: Negative for congestion, rhinorrhea and sore throat.   Eyes: Negative for visual disturbance.  Respiratory: Negative for cough and shortness of breath.   Cardiovascular: Negative for chest pain and palpitations.  Gastrointestinal: Negative for nausea, abdominal pain and diarrhea.  Genitourinary: Negative for dysuria.  Musculoskeletal: Negative for myalgias and arthralgias.    Objective:  BP 122/77 mmHg  Pulse 74  Temp(Src) 98.4 F (36.9 C) (Oral)  Ht 5\' 8"  (1.727 m)  Wt 193 lb (87.544 kg)  BMI 29.35 kg/m2  BP Readings from Last 3 Encounters:  01/10/16 122/77  01/08/16 135/84  01/05/16 129/76    Wt Readings from Last 3 Encounters:  01/10/16 193 lb (87.544 kg)  01/08/16 193 lb (87.544 kg)  01/05/16 189 lb (85.73 kg)     Physical Exam  Constitutional: She is oriented to person, place, and time. She appears well-developed and well-nourished.  No distress.  HENT:  Head: Normocephalic and atraumatic.  Eyes: Conjunctivae are normal. Pupils are equal, round, and reactive to light.  Neck: Normal range of motion. Neck supple. No thyromegaly present.  Cardiovascular: Normal rate, regular rhythm and normal heart sounds.   No murmur heard. Pulmonary/Chest: Effort normal and breath sounds normal. No respiratory distress. She has no wheezes. She has no rales.  Abdominal: Soft. Bowel sounds are normal. She exhibits no distension. There is no tenderness.  Musculoskeletal: Normal range of motion.  Lymphadenopathy:    She has no cervical adenopathy.  Neurological: She is alert and oriented to person, place, and time.  Skin: Skin is warm and dry.  Psychiatric: She has a normal mood and affect. Her behavior is normal. Judgment and thought content normal.     Lab Results  Component Value Date   WBC 3.9 12/29/2015   HGB 13.4 03/27/2015   HCT 42.8 12/29/2015   PLT 240 12/29/2015   GLUCOSE 99 12/29/2015   CHOL 172 12/29/2015   TRIG 76 12/29/2015   HDL 77 12/29/2015   LDLCALC 90 05/10/2014   ALT 49* 12/29/2015   AST 41* 12/29/2015   NA 141 12/29/2015   K 5.0 12/29/2015   CL 101 12/29/2015   CREATININE 0.61 12/29/2015   BUN 22 12/29/2015   CO2 27 12/29/2015   TSH 2.870 03/27/2015    Mr Ankle Right  Wo Contrast  04/07/2015  CLINICAL DATA:  Anterior medial right ankle pain with limited and painful range of motion. Stiffness. EXAM: MRI OF  THE RIGHT ANKLE WITHOUT CONTRAST TECHNIQUE: Multiplanar, multisequence MR imaging of the ankle was performed. No intravenous contrast was administered. COMPARISON:  Radiographs dated 03/31/2015 FINDINGS: TENDONS Peroneal: Normal. Posteromedial: Normal. Anterior: Normal. Achilles: Slight hypertrophic tendinopathy of the distal Achilles tendon. Plantar Fascia:  The fascia is normal.  Plantar calcaneal spurs. LIGAMENTS Lateral: Normal. Medial: Normal. CARTILAGE Ankle Joint: Normal. Subtalar Joints/Sinus  Tarsi: Normal posterior facets. Moderate arthritis of the put medial aspect of the middle facets and severe arthritis of the anterior facets of the subtalar joint. Bones: Severe arthritis of the talonavicular joint with full-thickness cartilage loss, multiple of sub cortical cysts and cortical erosions, joint effusion, prominent dorsal osteophyte formation and marked synovitis and synovial hypertrophy at the talonavicular joint. Edema throughout the navicular and throughout the head of the talus. Inflammation in the adjacent soft tissues. The cuneiforms and cuboid and the tarsal metatarsal joints demonstrate no significant abnormalities. Calcaneocuboid joint is normal. IMPRESSION: 1. 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 2. 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 3. 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 Electronically Signed   By: Lorriane Shire M.D.   On: 04/07/2015 14:58    Assessment & Plan:   Kelly George was seen today for animal bite.  Diagnoses and all orders for this visit:  Cellulitis of right upper extremity  Cat bite of forearm, right, initial encounter  Other orders -     clarithromycin (BIAXIN XL) 500 MG 24 hr tablet; Take 2 tablets (1,000 mg total) by mouth daily.      I am having Kelly George start on clarithromycin. I am also having her maintain her fish oil-omega-3 fatty acids, glucosamine-chondroitin, Vitamin D3, calcium-vitamin D, meloxicam, ALPRAZolam, naproxen sodium, cetirizine, and simvastatin.  Meds ordered this encounter  Medications  . clarithromycin (BIAXIN XL) 500 MG 24 hr tablet    Sig: Take 2 tablets (1,000 mg total) by mouth daily.    Dispense:  14 tablet    Refill:  0     Follow-up: No Follow-up on file.  Kelly George, M.D.

## 2016-01-10 NOTE — Addendum Note (Signed)
Addended by: Ilean China on: 01/10/2016 06:58 PM   Modules accepted: Orders

## 2016-01-11 NOTE — Progress Notes (Signed)
Patient aware.

## 2016-01-12 ENCOUNTER — Telehealth: Payer: Self-pay | Admitting: Family Medicine

## 2016-01-12 NOTE — Telephone Encounter (Signed)
Spoke to the pt and she states she has a liver ultrasound Monday and wanted to know if she needed to stop taking Erythromycin, per Dr.Vincent it isn't necessary to stop the antibiotic and pt was advised.

## 2016-01-15 ENCOUNTER — Ambulatory Visit
Admission: RE | Admit: 2016-01-15 | Discharge: 2016-01-15 | Disposition: A | Discharge status: Home / Self Care | Payer: Medicare Other | Source: Ambulatory Visit | Attending: Family Medicine | Admitting: Family Medicine

## 2016-01-15 DIAGNOSIS — R945 Abnormal results of liver function studies: Principal | ICD-10-CM

## 2016-01-15 DIAGNOSIS — R7989 Other specified abnormal findings of blood chemistry: Secondary | ICD-10-CM

## 2016-02-07 ENCOUNTER — Other Ambulatory Visit: Payer: Medicare Other

## 2016-02-07 DIAGNOSIS — R7989 Other specified abnormal findings of blood chemistry: Secondary | ICD-10-CM

## 2016-02-07 DIAGNOSIS — R945 Abnormal results of liver function studies: Principal | ICD-10-CM

## 2016-02-08 LAB — HEPATIC FUNCTION PANEL
ALT: 37 IU/L — ABNORMAL HIGH (ref 0–32)
AST: 31 IU/L (ref 0–40)
Albumin: 4.7 g/dL (ref 3.5–4.8)
Alkaline Phosphatase: 81 IU/L (ref 39–117)
Bilirubin Total: 0.6 mg/dL (ref 0.0–1.2)
Bilirubin, Direct: 0.18 mg/dL (ref 0.00–0.40)
Total Protein: 6.9 g/dL (ref 6.0–8.5)

## 2016-02-14 ENCOUNTER — Ambulatory Visit: Payer: Medicare Other

## 2016-02-16 ENCOUNTER — Ambulatory Visit
Admission: RE | Admit: 2016-02-16 | Discharge: 2016-02-16 | Disposition: A | Discharge status: Home / Self Care | Payer: Medicare Other | Source: Ambulatory Visit

## 2016-02-16 DIAGNOSIS — Z1231 Encounter for screening mammogram for malignant neoplasm of breast: Secondary | ICD-10-CM | POA: Diagnosis not present

## 2016-02-20 DIAGNOSIS — M9903 Segmental and somatic dysfunction of lumbar region: Secondary | ICD-10-CM | POA: Diagnosis not present

## 2016-02-20 DIAGNOSIS — M5116 Intervertebral disc disorders with radiculopathy, lumbar region: Secondary | ICD-10-CM | POA: Diagnosis not present

## 2016-02-22 DIAGNOSIS — M5116 Intervertebral disc disorders with radiculopathy, lumbar region: Secondary | ICD-10-CM | POA: Diagnosis not present

## 2016-02-22 DIAGNOSIS — M9903 Segmental and somatic dysfunction of lumbar region: Secondary | ICD-10-CM | POA: Diagnosis not present

## 2016-02-26 DIAGNOSIS — M5116 Intervertebral disc disorders with radiculopathy, lumbar region: Secondary | ICD-10-CM | POA: Diagnosis not present

## 2016-02-26 DIAGNOSIS — M9903 Segmental and somatic dysfunction of lumbar region: Secondary | ICD-10-CM | POA: Diagnosis not present

## 2016-02-27 DIAGNOSIS — M9903 Segmental and somatic dysfunction of lumbar region: Secondary | ICD-10-CM | POA: Diagnosis not present

## 2016-02-27 DIAGNOSIS — M5116 Intervertebral disc disorders with radiculopathy, lumbar region: Secondary | ICD-10-CM | POA: Diagnosis not present

## 2016-02-29 DIAGNOSIS — M5116 Intervertebral disc disorders with radiculopathy, lumbar region: Secondary | ICD-10-CM | POA: Diagnosis not present

## 2016-02-29 DIAGNOSIS — M9903 Segmental and somatic dysfunction of lumbar region: Secondary | ICD-10-CM | POA: Diagnosis not present

## 2016-03-04 DIAGNOSIS — M5116 Intervertebral disc disorders with radiculopathy, lumbar region: Secondary | ICD-10-CM | POA: Diagnosis not present

## 2016-03-04 DIAGNOSIS — M545 Low back pain: Secondary | ICD-10-CM | POA: Diagnosis not present

## 2016-03-04 DIAGNOSIS — M9903 Segmental and somatic dysfunction of lumbar region: Secondary | ICD-10-CM | POA: Diagnosis not present

## 2016-03-05 DIAGNOSIS — M25561 Pain in right knee: Secondary | ICD-10-CM | POA: Diagnosis not present

## 2016-03-07 DIAGNOSIS — M5116 Intervertebral disc disorders with radiculopathy, lumbar region: Secondary | ICD-10-CM | POA: Diagnosis not present

## 2016-03-07 DIAGNOSIS — M9903 Segmental and somatic dysfunction of lumbar region: Secondary | ICD-10-CM | POA: Diagnosis not present

## 2016-03-07 DIAGNOSIS — M545 Low back pain: Secondary | ICD-10-CM | POA: Diagnosis not present

## 2016-03-12 DIAGNOSIS — M545 Low back pain: Secondary | ICD-10-CM | POA: Diagnosis not present

## 2016-03-14 DIAGNOSIS — M545 Low back pain: Secondary | ICD-10-CM | POA: Diagnosis not present

## 2016-03-18 DIAGNOSIS — Z85828 Personal history of other malignant neoplasm of skin: Secondary | ICD-10-CM | POA: Diagnosis not present

## 2016-03-18 DIAGNOSIS — L910 Hypertrophic scar: Secondary | ICD-10-CM | POA: Diagnosis not present

## 2016-03-18 DIAGNOSIS — D485 Neoplasm of uncertain behavior of skin: Secondary | ICD-10-CM | POA: Diagnosis not present

## 2016-03-25 DIAGNOSIS — M545 Low back pain: Secondary | ICD-10-CM | POA: Diagnosis not present

## 2016-03-27 DIAGNOSIS — M545 Low back pain: Secondary | ICD-10-CM | POA: Diagnosis not present

## 2016-05-14 ENCOUNTER — Encounter: Payer: Self-pay | Admitting: Family Medicine

## 2016-05-14 ENCOUNTER — Ambulatory Visit (INDEPENDENT_AMBULATORY_CARE_PROVIDER_SITE_OTHER): Payer: Medicare Other | Admitting: Family Medicine

## 2016-05-14 VITALS — BP 122/85 | HR 74 | Temp 98.5°F | Ht 68.0 in | Wt 188.0 lb

## 2016-05-14 DIAGNOSIS — E559 Vitamin D deficiency, unspecified: Secondary | ICD-10-CM | POA: Diagnosis not present

## 2016-05-14 DIAGNOSIS — J302 Other seasonal allergic rhinitis: Secondary | ICD-10-CM | POA: Diagnosis not present

## 2016-05-14 DIAGNOSIS — E785 Hyperlipidemia, unspecified: Secondary | ICD-10-CM

## 2016-05-14 DIAGNOSIS — M79671 Pain in right foot: Secondary | ICD-10-CM

## 2016-05-14 DIAGNOSIS — I341 Nonrheumatic mitral (valve) prolapse: Secondary | ICD-10-CM

## 2016-05-14 NOTE — Patient Instructions (Signed)
Continue to be careful with the right foot and ankle and keep from reinjuring it Drink plenty fluids and stay well hydrated Continue to avoid caffeine

## 2016-05-14 NOTE — Progress Notes (Signed)
Subjective:    Patient ID: Kelly George, female    DOB: 11/03/1945, 70 y.o.   MRN: [id number]  HPI Patient is here today for a follow up on her chronic medical problems which include hyperlipidemia. Patient has no other complaints or concerns today. The patient continues to have ankle pain and has seen the orthopedic specialist that deals with extremities and he is not comfortable with doing any injections at the present time. She continues to take anti-inflammatory medicine over-the-counter for this every morning. She says the pain is less and the ankle is much more stiffer than it was with less movement and motion. She denies any chest pain or palpitations shortness of breath trouble swallowing heartburn indigestion nausea vomiting diarrhea or blood in the stool. She is passing her water without problems. Other than her ankle everything is going well for her. She says she needs to get more active and try to lose some weight. She had her lab work drawn earlier this morning. She does have occasional indigestion but is usually related to something she is eating.   Review of Systems  Constitutional: Negative.   HENT: Negative.   Eyes: Negative.   Respiratory: Negative.   Cardiovascular: Negative.   Gastrointestinal: Negative.   Endocrine: Negative.   Genitourinary: Negative.   Musculoskeletal: Negative.   Skin: Negative.   Allergic/Immunologic: Negative.   Neurological: Negative.   Hematological: Negative.   Psychiatric/Behavioral: Negative.    Patient Active Problem List   Diagnosis Date Noted  . Precordial chest pain 07/07/2015  . Pain in joint of right foot 05/16/2014  . Vitamin D deficiency 07/08/2013  . Mitral valve prolapse syndrome, history of mild 01/05/2013  . Seasonal allergies   . Anxiety   . Depression   . Hyperlipidemia    Outpatient Encounter Prescriptions as of 05/14/2016  Medication Sig  . ALPRAZolam (XANAX) 0.25 MG tablet Take 1 tablet (0.25 mg total) by mouth  at bedtime as needed for sleep.  . calcium-vitamin D (OSCAL WITH D) 500-200 MG-UNIT per tablet Take 1 tablet by mouth daily.  . cetirizine (ZYRTEC) 10 MG tablet Take 10 mg by mouth daily. Reported on 01/05/2016  . Cholecalciferol (VITAMIN D3) 2000 UNITS TABS Take 5,000 Units by mouth daily.   . fish oil-omega-3 fatty acids 1000 MG capsule Take 1,200 mg by mouth daily.   Marland Kitchen glucosamine-chondroitin 500-400 MG tablet Take 1 tablet by mouth 2 (two) times daily.   . meloxicam (MOBIC) 15 MG tablet 15 mg daily as needed for pain.  . naproxen sodium (ANAPROX) 220 MG tablet Take 220 mg by mouth daily. TAKE TWO TABLETS ONCE DAILY  . simvastatin (ZOCOR) 10 MG tablet TAKE 1 TABLET BY MOUTH AT BEDTIME  . [DISCONTINUED] clarithromycin (BIAXIN) 500 MG tablet Take 1 tablet (500 mg total) by mouth 2 (two) times daily.   No facility-administered encounter medications on file as of 05/14/2016.        Objective:   Physical Exam  Constitutional: She is oriented to person, place, and time. She appears well-developed and well-nourished. No distress.  The patient is alert and pleasant and in good spirits. She is a Theatre manager.  HENT:  Head: Normocephalic and atraumatic.  Right Ear: External ear normal.  Left Ear: External ear normal.  Nose: Nose normal.  Mouth/Throat: Oropharynx is clear and moist.  Eyes: Conjunctivae and EOM are normal. Pupils are equal, round, and reactive to light. Right eye exhibits no discharge. Left eye exhibits no discharge. No scleral icterus.  Neck: Normal range of motion. Neck supple. No thyromegaly present.  No bruits thyromegaly or anterior cervical adenopathy  Cardiovascular: Normal rate, regular rhythm and intact distal pulses.   Murmur heard. There is a grade 3/6 systolic ejection murmur The heart has a regular rate and rhythm at 72/m  Pulmonary/Chest: Effort normal and breath sounds normal. No respiratory distress. She has no wheezes. She has no rales. She exhibits no tenderness.    Clear anteriorly and posteriorly  Abdominal: Soft. Bowel sounds are normal. She exhibits no mass. There is no tenderness. There is no rebound and no guarding.  No abdominal tenderness liver or spleen enlargement  Musculoskeletal: She exhibits tenderness. She exhibits no edema.  There is stiffness and slight swelling in the right ankle and decreased mobility. There is slight tenderness mostly on the top of the foot. As mentioned she saw the orthopedic specialist and he did not feel like he should do anything at this point in time.  Lymphadenopathy:    She has no cervical adenopathy.  Neurological: She is alert and oriented to person, place, and time. She has normal reflexes. No cranial nerve deficit.  Skin: Skin is warm and dry. No rash noted.  Psychiatric: She has a normal mood and affect. Her behavior is normal. Judgment and thought content normal.  Nursing note and vitals reviewed.   BP 122/85 (BP Location: Left Arm, Patient Position: Sitting, Cuff Size: Normal)   Pulse 74   Temp 98.5 F (36.9 C) (Oral)   Ht 5' 8"  (1.727 m)   Wt 188 lb (85.3 kg)   BMI 28.59 kg/m        Assessment & Plan:  1. Hyperlipidemia -Continue current treatment pending results of lab work - BMP8+EGFR - Hepatic function panel - NMR, lipoprofile - CBC with Differential/Platelet  2. Vitamin D deficiency -Continue current treatment pending results of lab work - VITAMIN D 25 Hydroxy (Vit-D Deficiency, Fractures) - CBC with Differential/Platelet  3. Mitral valve prolapse syndrome, history of mild -The murmur persists but the patient is having minimal problems with her mitral valve prolapse and palpitations and we will continue to monitor this.  4. Seasonal allergies -We will remind the patient to use Flonase and nasal saline as the allergy season will be upon is soon for the fall.  5. Right foot pain -Continue Aleve and make sure that she takes this medicine after eating.  Patient Instructions   Continue to be careful with the right foot and ankle and keep from reinjuring it Drink plenty fluids and stay well hydrated Continue to avoid caffeine  Arrie Senate MD

## 2016-05-15 LAB — NMR, LIPOPROFILE
Cholesterol: 166 mg/dL (ref 100–199)
HDL Cholesterol by NMR: 77 mg/dL (ref >39)
HDL Particle Number: 40.9 umol/L (ref >=30.5)
LDL Particle Number: 983 nmol/L (ref <1000)
LDL Size: 21.2 nm (ref >20.5)
LDL-C: 77 mg/dL (ref 0–99)
LP-IR Score: <25 (ref <=45)
Small LDL Particle Number: 242 nmol/L (ref <=527)
Triglycerides by NMR: 60 mg/dL (ref 0–149)

## 2016-05-15 LAB — VITAMIN D 25 HYDROXY (VIT D DEFICIENCY, FRACTURES): Vit D, 25-Hydroxy: 53.7 ng/mL (ref 30.0–100.0)

## 2016-05-15 LAB — BMP8+EGFR
BUN/Creatinine Ratio: 35 — ABNORMAL HIGH (ref 12–28)
BUN: 23 mg/dL (ref 8–27)
CO2: 26 mmol/L (ref 18–29)
Calcium: 9.4 mg/dL (ref 8.7–10.3)
Chloride: 102 mmol/L (ref 96–106)
Creatinine, Ser: 0.66 mg/dL (ref 0.57–1.00)
GFR calc Af Amer: 103 mL/min/{1.73_m2} (ref >59)
GFR calc non Af Amer: 89 mL/min/{1.73_m2} (ref >59)
Glucose: 101 mg/dL — ABNORMAL HIGH (ref 65–99)
Potassium: 4.5 mmol/L (ref 3.5–5.2)
Sodium: 141 mmol/L (ref 134–144)

## 2016-05-15 LAB — CBC WITH DIFFERENTIAL/PLATELET
Basophils Absolute: 0 10*3/uL (ref 0.0–0.2)
Basos: 1 %
EOS (ABSOLUTE): 0.1 10*3/uL (ref 0.0–0.4)
Eos: 4 %
Hematocrit: 42 % (ref 34.0–46.6)
Hemoglobin: 14 g/dL (ref 11.1–15.9)
Immature Grans (Abs): 0 10*3/uL (ref 0.0–0.1)
Immature Granulocytes: 0 %
Lymphocytes Absolute: 1 10*3/uL (ref 0.7–3.1)
Lymphs: 27 %
MCH: 31.5 pg (ref 26.6–33.0)
MCHC: 33.3 g/dL (ref 31.5–35.7)
MCV: 94 fL (ref 79–97)
Monocytes Absolute: 0.5 10*3/uL (ref 0.1–0.9)
Monocytes: 13 %
Neutrophils Absolute: 2 10*3/uL (ref 1.4–7.0)
Neutrophils: 55 %
Platelets: 250 10*3/uL (ref 150–379)
RBC: 4.45 x10E6/uL (ref 3.77–5.28)
RDW: 13.5 % (ref 12.3–15.4)
WBC: 3.7 10*3/uL (ref 3.4–10.8)

## 2016-05-15 LAB — HEPATIC FUNCTION PANEL
ALT: 25 IU/L (ref 0–32)
AST: 23 IU/L (ref 0–40)
Albumin: 4.6 g/dL (ref 3.5–4.8)
Alkaline Phosphatase: 79 IU/L (ref 39–117)
Bilirubin Total: 0.7 mg/dL (ref 0.0–1.2)
Bilirubin, Direct: 0.21 mg/dL (ref 0.00–0.40)
Total Protein: 6.9 g/dL (ref 6.0–8.5)

## 2016-06-14 DIAGNOSIS — C44311 Basal cell carcinoma of skin of nose: Secondary | ICD-10-CM | POA: Diagnosis not present

## 2016-07-29 ENCOUNTER — Other Ambulatory Visit: Payer: Self-pay | Admitting: Family Medicine

## 2016-08-19 DIAGNOSIS — C44319 Basal cell carcinoma of skin of other parts of face: Secondary | ICD-10-CM | POA: Diagnosis not present

## 2016-09-30 ENCOUNTER — Ambulatory Visit (INDEPENDENT_AMBULATORY_CARE_PROVIDER_SITE_OTHER): Payer: Medicare Other

## 2016-09-30 ENCOUNTER — Ambulatory Visit (INDEPENDENT_AMBULATORY_CARE_PROVIDER_SITE_OTHER): Payer: Medicare Other | Admitting: Family Medicine

## 2016-09-30 ENCOUNTER — Encounter: Payer: Self-pay | Admitting: Family Medicine

## 2016-09-30 VITALS — BP 133/78 | HR 59 | Temp 97.7°F | Ht 68.0 in | Wt 191.0 lb

## 2016-09-30 DIAGNOSIS — E7800 Pure hypercholesterolemia, unspecified: Secondary | ICD-10-CM

## 2016-09-30 DIAGNOSIS — E559 Vitamin D deficiency, unspecified: Secondary | ICD-10-CM

## 2016-09-30 DIAGNOSIS — F320 Major depressive disorder, single episode, mild: Secondary | ICD-10-CM

## 2016-09-30 DIAGNOSIS — I341 Nonrheumatic mitral (valve) prolapse: Secondary | ICD-10-CM

## 2016-09-30 DIAGNOSIS — Z23 Encounter for immunization: Secondary | ICD-10-CM | POA: Diagnosis not present

## 2016-09-30 MED ORDER — SERTRALINE HCL 50 MG PO TABS: 50.0000 mg | ORAL_TABLET | Freq: Every day | ORAL | 3 refills | Status: DC

## 2016-09-30 NOTE — Patient Instructions (Addendum)
Medicare Annual Wellness Visit  [facility] and the medical providers at [facility] strive to bring you the best medical care.  In doing so we not only want to address your current medical conditions and concerns but also to detect new conditions early and prevent illness, disease and health-related problems.    Medicare offers a yearly Wellness Visit which allows our clinical staff to assess your need for preventative services including immunizations, lifestyle education, counseling to decrease risk of preventable diseases and screening for fall risk and other medical concerns.    This visit is provided free of charge (no copay) for all Medicare recipients. The clinical pharmacists at [facility] have begun to conduct these Wellness Visits which will also include a thorough review of all your medications.    As you primary medical provider recommend that you make an appointment for your Annual Wellness Visit if you have not done so already this year.  You may set up this appointment before you leave today or you may call back [phone] and schedule an appointment.  Please make sure when you call that you mention that you are scheduling your Annual Wellness Visit with the clinical pharmacist so that the appointment may be made for the proper length of time.     Continue current medications. Continue good therapeutic lifestyle changes which include good diet and exercise. Fall precautions discussed with patient. If an FOBT was given today- please return it to our front desk. If you are over 58 years old - you may need Prevnar 79 or the adult Pneumonia vaccine.  **Flu shots are available--- please call and schedule a FLU-CLINIC appointment**  After your visit with [place] today you will receive a survey in the mail or online from [employer] regarding your care with [place]. Please take a moment to fill this out. Your feedback is very  important to [place] as you can help [place] better understand your patient needs as well as improve your experience and satisfaction. WE CARE ABOUT YOU!!!   Watch the diet more closely and stay away from irritating foods caffeine fried foods and greasy foods and highly spiced foods. Make sure that you take your Aleve after breakfast. Start some ranitidine or Zantac 150 mg before breakfast and supper. Restart the Zoloft and take this for 2-3 months or until the weather warms up and he feel that you did not need it anymore.

## 2016-09-30 NOTE — Progress Notes (Signed)
Subjective:    Patient ID: Kelly George, female    DOB: Oct 13, 1946, 70 y.o.   MRN: [id number]  HPI Pt here for follow up and management of chronic medical problems which includes hyperlipidemia. She is taking medications regularly.The patient today does complain of some depression and increased emotion issues. She is due to get a flu shot and this will be given today. She is also due to get a chest x-ray and lab work. Also an FOBT. Her last colonoscopy was in 2013. The patient says that she gets more emotional this time of the year. She has been on Zoloft in the past and we will restart this and have her take it for a few months and that she can wean off of it. She denies any chest pain pressure palpitations or shortness of breath. She does have occasional heartburn and indigestion and does take Aleve 2 tablets every morning and we will remind her of the should be taken after eating. She is not taking any ranitidine or anything to protect her stomach. We'll have her start some ranitidine and take this twice daily for 2-3 months. She says that she is passing her water without problems.    Patient Active Problem List   Diagnosis Date Noted  . Precordial chest pain 07/07/2015  . Pain in joint of right foot 05/16/2014  . Vitamin D deficiency 07/08/2013  . Mitral valve prolapse syndrome, history of mild 01/05/2013  . Seasonal allergies   . Anxiety   . Depression   . Hyperlipidemia    Outpatient Encounter Prescriptions as of 09/30/2016  Medication Sig  . ALPRAZolam (XANAX) 0.25 MG tablet Take 1 tablet (0.25 mg total) by mouth at bedtime as needed for sleep.  . calcium-vitamin D (OSCAL WITH D) 500-200 MG-UNIT per tablet Take 1 tablet by mouth daily.  . cetirizine (ZYRTEC) 10 MG tablet Take 10 mg by mouth daily. Reported on 01/05/2016  . Cholecalciferol (VITAMIN D3) 2000 UNITS TABS Take 5,000 Units by mouth daily.   . fish oil-omega-3 fatty acids 1000 MG capsule Take 1,200 mg by mouth daily.     Marland Kitchen glucosamine-chondroitin 500-400 MG tablet Take 1 tablet by mouth 2 (two) times daily.   . meloxicam (MOBIC) 15 MG tablet 15 mg daily as needed for pain.  . naproxen sodium (ANAPROX) 220 MG tablet Take 220 mg by mouth daily. TAKE TWO TABLETS ONCE DAILY  . simvastatin (ZOCOR) 10 MG tablet TAKE 1 TABLET BY MOUTH AT BEDTIME   No facility-administered encounter medications on file as of 09/30/2016.       Review of Systems  Constitutional: Negative.   HENT: Negative.   Eyes: Negative.   Respiratory: Negative.   Cardiovascular: Negative.   Gastrointestinal: Negative.   Endocrine: Negative.   Genitourinary: Negative.   Musculoskeletal: Negative.   Skin: Negative.   Allergic/Immunologic: Negative.   Neurological: Negative.   Hematological: Negative.   Psychiatric/Behavioral: Negative.        Some depression / emotional       Objective:   Physical Exam  Constitutional: She is oriented to person, place, and time. She appears well-developed and well-nourished. No distress.  HENT:  Head: Normocephalic and atraumatic.  Right Ear: External ear normal.  Left Ear: External ear normal.  Nose: Nose normal.  Mouth/Throat: Oropharynx is clear and moist.  Eyes: Conjunctivae and EOM are normal. Pupils are equal, round, and reactive to light. Right eye exhibits no discharge. Left eye exhibits no discharge. No scleral icterus.  Neck: Normal range of motion. Neck supple. No thyromegaly present.  Cardiovascular: Normal rate, regular rhythm and intact distal pulses.   Murmur heard. The heart is regular at 60/m. There is a grade 2/6 systolic ejection murmur. This has been evaluated by the cardiologist in the past.  Pulmonary/Chest: Effort normal and breath sounds normal. No respiratory distress. She has no wheezes. She has no rales.  Clear anteriorly and posteriorly  Abdominal: Soft. Bowel sounds are normal. She exhibits no mass. There is no tenderness. There is no rebound and no guarding.  No  abdominal tenderness or organ enlargement or bruits  Musculoskeletal: Normal range of motion. She exhibits no edema.  Lymphadenopathy:    She has no cervical adenopathy.  Neurological: She is alert and oriented to person, place, and time. She has normal reflexes. No cranial nerve deficit.  Skin: Skin is warm and dry. No rash noted.  Psychiatric: She has a normal mood and affect. Her behavior is normal. Judgment and thought content normal.  Nursing note and vitals reviewed.  BP 133/78 (BP Location: Left Arm)   Pulse (!) 59   Temp 97.7 F (36.5 C) (Oral)   Ht 5' 8"  (1.727 m)   Wt 191 lb (86.6 kg)   BMI 29.04 kg/m         Assessment & Plan:  1. Pure hypercholesterolemia -Continue current treatment and aggressive therapeutic lifestyle changes - DG Chest 2 View; Future - BMP8+EGFR - CBC with Differential/Platelet - Hepatic function panel - NMR, lipoprofile  2. Vitamin D deficiency -Continue current treatment pending results of lab work - CBC with Differential/Platelet - VITAMIN D 25 Hydroxy (Vit-D Deficiency, Fractures)  3. Mitral valve prolapse syndrome, history of mild -Follow-up with cardiology as needed - DG Chest 2 View; Future - CBC with Differential/Platelet  4. Mild single current episode of major depressive disorder ([place]) -Restart Zoloft as planned  Meds ordered this encounter  Medications  . sertraline (ZOLOFT) 50 MG tablet    Sig: Take 1 tablet (50 mg total) by mouth daily.    Dispense:  90 tablet    Refill:  3   Patient Instructions                       Medicare Annual Wellness Visit  [facility] and the medical providers at [facility] strive to bring you the best medical care.  In doing so we not only want to address your current medical conditions and concerns but also to detect new conditions early and prevent illness, disease and health-related problems.    Medicare offers a yearly Wellness Visit which allows our clinical  staff to assess your need for preventative services including immunizations, lifestyle education, counseling to decrease risk of preventable diseases and screening for fall risk and other medical concerns.    This visit is provided free of charge (no copay) for all Medicare recipients. The clinical pharmacists at [facility] have begun to conduct these Wellness Visits which will also include a thorough review of all your medications.    As you primary medical provider recommend that you make an appointment for your Annual Wellness Visit if you have not done so already this year.  You may set up this appointment before you leave today or you may call back [phone] and schedule an appointment.  Please make sure when you call that you mention that you are scheduling your Annual Wellness Visit with the clinical pharmacist so that  the appointment may be made for the proper length of time.     Continue current medications. Continue good therapeutic lifestyle changes which include good diet and exercise. Fall precautions discussed with patient. If an FOBT was given today- please return it to our front desk. If you are over 54 years old - you may need Prevnar 21 or the adult Pneumonia vaccine.  **Flu shots are available--- please call and schedule a FLU-CLINIC appointment**  After your visit with [place] today you will receive a survey in the mail or online from [employer] regarding your care with [place]. Please take a moment to fill this out. Your feedback is very important to [place] as you can help [place] better understand your patient needs as well as improve your experience and satisfaction. WE CARE ABOUT YOU!!!   Watch the diet more closely and stay away from irritating foods caffeine fried foods and greasy foods and highly spiced foods. Make sure that you take your Aleve after breakfast. Start some ranitidine or Zantac 150 mg before breakfast and supper. Restart the Zoloft and take this  for 2-3 months or until the weather warms up and he feel that you did not need it anymore.    Arrie Senate MD

## 2016-10-01 LAB — NMR, LIPOPROFILE
Cholesterol: 171 mg/dL (ref 100–199)
HDL Cholesterol by NMR: 90 mg/dL (ref >39)
HDL Particle Number: 41.1 umol/L (ref >=30.5)
LDL Particle Number: 556 nmol/L (ref <1000)
LDL Size: 20.9 nm (ref >20.5)
LDL-C: 71 mg/dL (ref 0–99)
LP-IR Score: <25 (ref <=45)
Small LDL Particle Number: <90 nmol/L (ref <=527)
Triglycerides by NMR: 52 mg/dL (ref 0–149)

## 2016-10-01 LAB — CBC WITH DIFFERENTIAL/PLATELET
Basophils Absolute: 0 10*3/uL (ref 0.0–0.2)
Basos: 1 %
EOS (ABSOLUTE): 0.2 10*3/uL (ref 0.0–0.4)
Eos: 4 %
Hematocrit: 42.5 % (ref 34.0–46.6)
Hemoglobin: 13.7 g/dL (ref 11.1–15.9)
Immature Grans (Abs): 0 10*3/uL (ref 0.0–0.1)
Immature Granulocytes: 0 %
Lymphocytes Absolute: 1.2 10*3/uL (ref 0.7–3.1)
Lymphs: 30 %
MCH: 30.8 pg (ref 26.6–33.0)
MCHC: 32.2 g/dL (ref 31.5–35.7)
MCV: 96 fL (ref 79–97)
Monocytes Absolute: 0.5 10*3/uL (ref 0.1–0.9)
Monocytes: 12 %
Neutrophils Absolute: 2.2 10*3/uL (ref 1.4–7.0)
Neutrophils: 53 %
Platelets: 247 10*3/uL (ref 150–379)
RBC: 4.45 x10E6/uL (ref 3.77–5.28)
RDW: 14.3 % (ref 12.3–15.4)
WBC: 4.1 10*3/uL (ref 3.4–10.8)

## 2016-10-01 LAB — HEPATIC FUNCTION PANEL
ALT: 33 IU/L — ABNORMAL HIGH (ref 0–32)
AST: 31 IU/L (ref 0–40)
Albumin: 4.6 g/dL (ref 3.5–4.8)
Alkaline Phosphatase: 85 IU/L (ref 39–117)
Bilirubin Total: 0.7 mg/dL (ref 0.0–1.2)
Bilirubin, Direct: 0.22 mg/dL (ref 0.00–0.40)
Total Protein: 6.8 g/dL (ref 6.0–8.5)

## 2016-10-01 LAB — VITAMIN D 25 HYDROXY (VIT D DEFICIENCY, FRACTURES): Vit D, 25-Hydroxy: 52.1 ng/mL (ref 30.0–100.0)

## 2016-10-01 LAB — BMP8+EGFR
BUN/Creatinine Ratio: 26 (ref 12–28)
BUN: 16 mg/dL (ref 8–27)
CO2: 27 mmol/L (ref 18–29)
Calcium: 9.4 mg/dL (ref 8.7–10.3)
Chloride: 104 mmol/L (ref 96–106)
Creatinine, Ser: 0.62 mg/dL (ref 0.57–1.00)
GFR calc Af Amer: 104 mL/min/{1.73_m2} (ref >59)
GFR calc non Af Amer: 90 mL/min/{1.73_m2} (ref >59)
Glucose: 92 mg/dL (ref 65–99)
Potassium: 3.8 mmol/L (ref 3.5–5.2)
Sodium: 140 mmol/L (ref 134–144)

## 2016-10-21 DIAGNOSIS — L814 Other melanin hyperpigmentation: Secondary | ICD-10-CM | POA: Diagnosis not present

## 2016-10-21 DIAGNOSIS — Z85828 Personal history of other malignant neoplasm of skin: Secondary | ICD-10-CM | POA: Diagnosis not present

## 2016-10-21 DIAGNOSIS — L821 Other seborrheic keratosis: Secondary | ICD-10-CM | POA: Diagnosis not present

## 2016-11-11 ENCOUNTER — Telehealth: Payer: Self-pay | Admitting: Family Medicine

## 2016-11-11 MED ORDER — SIMVASTATIN 10 MG PO TABS: 10.0000 mg | ORAL_TABLET | Freq: Every day | ORAL | 1 refills | Status: DC

## 2016-11-11 NOTE — Telephone Encounter (Signed)
Pt needs refill on simvastatin Refill sent into [facility] per pt request Okayed per Dr Laurance Flatten

## 2016-11-12 ENCOUNTER — Other Ambulatory Visit: Payer: Self-pay | Admitting: *Deleted

## 2016-11-18 ENCOUNTER — Other Ambulatory Visit: Payer: Medicare Other

## 2016-11-18 DIAGNOSIS — Z1212 Encounter for screening for malignant neoplasm of rectum: Secondary | ICD-10-CM | POA: Diagnosis not present

## 2016-11-19 LAB — FECAL OCCULT BLOOD, IMMUNOCHEMICAL: Fecal Occult Bld: NEGATIVE

## 2016-12-16 DIAGNOSIS — L570 Actinic keratosis: Secondary | ICD-10-CM | POA: Diagnosis not present

## 2017-02-19 ENCOUNTER — Encounter: Payer: Self-pay | Admitting: Family Medicine

## 2017-02-19 ENCOUNTER — Ambulatory Visit (INDEPENDENT_AMBULATORY_CARE_PROVIDER_SITE_OTHER): Payer: Medicare Other | Admitting: Family Medicine

## 2017-02-19 VITALS — BP 137/79 | HR 63 | Temp 97.6°F | Ht 68.0 in | Wt 188.0 lb

## 2017-02-19 DIAGNOSIS — E559 Vitamin D deficiency, unspecified: Secondary | ICD-10-CM | POA: Diagnosis not present

## 2017-02-19 DIAGNOSIS — F3342 Major depressive disorder, recurrent, in full remission: Secondary | ICD-10-CM | POA: Diagnosis not present

## 2017-02-19 DIAGNOSIS — E7800 Pure hypercholesterolemia, unspecified: Secondary | ICD-10-CM

## 2017-02-19 DIAGNOSIS — I341 Nonrheumatic mitral (valve) prolapse: Secondary | ICD-10-CM

## 2017-02-19 NOTE — Progress Notes (Signed)
Subjective:    Patient ID: Kelly George, female    DOB: Dec 28, 1945, 71 y.o.   MRN: [id number]  HPI Pt here for follow up and management of chronic medical problems which includes hyperlipidemia. She is taking medication regularly.The patient is doing well overall. She denies any chest pain or palpitations or shortness of breath. She denies any trouble with nausea vomiting diarrhea blood in the stool or black tarry bowel movements. She did have a colonoscopy and the next one is not due for 10 years and this will be September 2023. She is going to schedule her her mammogram and DEXA scan and she will make sure that we get a copy of this report. She will get lab work today. She is passing her water without problems. She is wanting to wean off of the Zoloft and we discussed on how to do this and told her that she should keep the prescription active in case she needs to restart it again and she understands how to do this also.     Patient Active Problem List   Diagnosis Date Noted  . Precordial chest pain 07/07/2015  . Pain in joint of right foot 05/16/2014  . Vitamin D deficiency 07/08/2013  . Mitral valve prolapse syndrome, history of mild 01/05/2013  . Seasonal allergies   . Anxiety   . Depression   . Hyperlipidemia    Outpatient Encounter Prescriptions as of 02/19/2017  Medication Sig  . ALPRAZolam (XANAX) 0.25 MG tablet Take 1 tablet (0.25 mg total) by mouth at bedtime as needed for sleep.  . calcium-vitamin D (OSCAL WITH D) 500-200 MG-UNIT per tablet Take 1 tablet by mouth daily.  . cetirizine (ZYRTEC) 10 MG tablet Take 10 mg by mouth daily. Reported on 01/05/2016  . Cholecalciferol (VITAMIN D3) 2000 UNITS TABS Take 5,000 Units by mouth daily.   . fish oil-omega-3 fatty acids 1000 MG capsule Take 1,200 mg by mouth daily.   Marland Kitchen glucosamine-chondroitin 500-400 MG tablet Take 1 tablet by mouth 2 (two) times daily.   . meloxicam (MOBIC) 15 MG tablet 15 mg daily as needed for pain.  .  naproxen sodium (ANAPROX) 220 MG tablet Take 220 mg by mouth daily. TAKE TWO TABLETS ONCE DAILY  . sertraline (ZOLOFT) 50 MG tablet Take 1 tablet (50 mg total) by mouth daily.  . simvastatin (ZOCOR) 10 MG tablet Take 1 tablet (10 mg total) by mouth at bedtime.   No facility-administered encounter medications on file as of 02/19/2017.      Review of Systems  Constitutional: Negative.   HENT: Negative.   Eyes: Negative.   Respiratory: Negative.   Cardiovascular: Negative.   Gastrointestinal: Negative.   Endocrine: Negative.   Genitourinary: Negative.   Musculoskeletal: Negative.   Skin: Negative.   Allergic/Immunologic: Negative.   Neurological: Negative.   Hematological: Negative.   Psychiatric/Behavioral: Negative.        Objective:   Physical Exam  Constitutional: She is oriented to person, place, and time. She appears well-developed and well-nourished. No distress.  The patient is pleasant and alert and doing well overall in good spirits.  HENT:  Head: Normocephalic and atraumatic.  Right Ear: External ear normal.  Left Ear: External ear normal.  Nose: Nose normal.  Mouth/Throat: No oropharyngeal exudate.  Eyes: Conjunctivae and EOM are normal. Pupils are equal, round, and reactive to light. Right eye exhibits no discharge. Left eye exhibits no discharge. No scleral icterus.  Neck: Normal range of motion. Neck supple. No thyromegaly  present.  No bruits thyromegaly or anterior cervical adenopathy  Cardiovascular: Normal rate, regular rhythm and intact distal pulses.   Murmur heard. The heart has a regular rate and rhythm at 60/m with a grade 2/6 systolic ejection murmur. The patient has had an echocardiogram and this is been evaluated by the cardiologist.  Pulmonary/Chest: Effort normal and breath sounds normal. No respiratory distress. She has no wheezes. She has no rales.  The chest is clear anteriorly and posteriorly  Abdominal: Soft. Bowel sounds are normal. She exhibits  no mass. There is no tenderness. There is no rebound and no guarding.  No abdominal tenderness masses or bruits or organ enlargement  Musculoskeletal: Normal range of motion. She exhibits no edema.  Lymphadenopathy:    She has no cervical adenopathy.  Neurological: She is alert and oriented to person, place, and time. She has normal reflexes. No cranial nerve deficit.  Skin: Skin is warm and dry. No rash noted.  Psychiatric: She has a normal mood and affect. Her behavior is normal. Judgment and thought content normal.  Patient is upbeat and feeling well and will reduce her Zoloft as discussed with her today.  Nursing note and vitals reviewed.  BP 137/79 (BP Location: Left Arm)   Pulse 63   Temp 97.6 F (36.4 C) (Oral)   Ht _0  (1.727 m)   Wt 188 lb (85.3 kg)   BMI 28.59 kg/m         Assessment & Plan:  1. Pure hypercholesterolemia -Continue with aggressive therapeutic lifestyle changes including diet and exercise - BMP8+EGFR - CBC with Differential/Platelet - Hepatic function panel - NMR, lipoprofile  2. Vitamin D deficiency -Continue with vitamin D replacement pending results of lab work - CBC with Differential/Platelet - VITAMIN D 25 Hydroxy (Vit-D Deficiency, Fractures)  3. Mitral valve prolapse syndrome, history of mild -This has remained stable and the patient is doing well with this.  4. Recurrent major depressive disorder, in full remission [facility]) -The patient is doing well as far as her depression is concerned and she will reduce the medicine gradually but keep the prescription active in case she needs to restart the Zoloft.  Patient Instructions                       Medicare Annual Wellness Visit  [place] and the medical providers at [facility] strive to bring you the best medical care.  In doing so we not only want to address your current medical conditions and concerns but also to detect new conditions early and prevent illness,  disease and health-related problems.    Medicare offers a yearly Wellness Visit which allows our clinical staff to assess your need for preventative services including immunizations, lifestyle education, counseling to decrease risk of preventable diseases and screening for fall risk and other medical concerns.    This visit is provided free of charge (no copay) for all Medicare recipients. The clinical pharmacists at [facility] have begun to conduct these Wellness Visits which will also include a thorough review of all your medications.    As you primary medical provider recommend that you make an appointment for your Annual Wellness Visit if you have not done so already this year.  You may set up this appointment before you leave today or you may call back ([phone]) and schedule an appointment.  Please make sure when you call that you mention that you are scheduling your Annual Wellness  Visit with the clinical pharmacist so that the appointment may be made for the proper length of time.     Continue current medications. Continue good therapeutic lifestyle changes which include good diet and exercise. Fall precautions discussed with patient. If an FOBT was given today- please return it to our front desk. If you are over 1 years old - you may need Prevnar 76 or the adult Pneumonia vaccine.  **Flu shots are available--- please call and schedule a FLU-CLINIC appointment**  After your visit with [place] today you will receive a survey in the mail or online from [employer] regarding your care with [place]. Please take a moment to fill this out. Your feedback is very important to [place] as you can help [place] better understand your patient needs as well as improve your experience and satisfaction. WE CARE ABOUT YOU!!!   The patient will reduce her Zoloft as directed and will call us if she feels the need to restart it. She is due to her next colonoscopy in September 2023. Dr. Silvano Rusk did  her last colonoscopy The patient will stay active and we'll make every effort to drink plenty of fluids and stay well hydrated.  Arrie Senate MD

## 2017-02-19 NOTE — Patient Instructions (Addendum)
Medicare Annual Wellness Visit  [facility] and the medical providers at [facility] strive to bring you the best medical care.  In doing so we not only want to address your current medical conditions and concerns but also to detect new conditions early and prevent illness, disease and health-related problems.    Medicare offers a yearly Wellness Visit which allows our clinical staff to assess your need for preventative services including immunizations, lifestyle education, counseling to decrease risk of preventable diseases and screening for fall risk and other medical concerns.    This visit is provided free of charge (no copay) for all Medicare recipients. The clinical pharmacists at [facility] have begun to conduct these Wellness Visits which will also include a thorough review of all your medications.    As you primary medical provider recommend that you make an appointment for your Annual Wellness Visit if you have not done so already this year.  You may set up this appointment before you leave today or you may call back ([phone]) and schedule an appointment.  Please make sure when you call that you mention that you are scheduling your Annual Wellness Visit with the clinical pharmacist so that the appointment may be made for the proper length of time.     Continue current medications. Continue good therapeutic lifestyle changes which include good diet and exercise. Fall precautions discussed with patient. If an FOBT was given today- please return it to our front desk. If you are over 24 years old - you may need Prevnar 16 or the adult Pneumonia vaccine.  **Flu shots are available--- please call and schedule a FLU-CLINIC appointment**  After your visit with [place] today you will receive a survey in the mail or online from [employer] regarding your care with [place]. Please take a moment to fill this out. Your feedback is very  important to [place] as you can help [place] better understand your patient needs as well as improve your experience and satisfaction. WE CARE ABOUT YOU!!!   The patient will reduce her Zoloft as directed and will call us if she feels the need to restart it. She is due to her next colonoscopy in September 2023. Dr. Silvano Rusk did her last colonoscopy The patient will stay active and we'll make every effort to drink plenty of fluids and stay well hydrated.

## 2017-02-20 LAB — CBC WITH DIFFERENTIAL/PLATELET
Basophils Absolute: 0 10*3/uL (ref 0.0–0.2)
Basos: 1 %
EOS (ABSOLUTE): 0.1 10*3/uL (ref 0.0–0.4)
Eos: 4 %
Hematocrit: 41.2 % (ref 34.0–46.6)
Hemoglobin: 13.8 g/dL (ref 11.1–15.9)
Immature Grans (Abs): 0 10*3/uL (ref 0.0–0.1)
Immature Granulocytes: 0 %
Lymphocytes Absolute: 1.1 10*3/uL (ref 0.7–3.1)
Lymphs: 31 %
MCH: 31.3 pg (ref 26.6–33.0)
MCHC: 33.5 g/dL (ref 31.5–35.7)
MCV: 93 fL (ref 79–97)
Monocytes Absolute: 0.4 10*3/uL (ref 0.1–0.9)
Monocytes: 10 %
Neutrophils Absolute: 1.9 10*3/uL (ref 1.4–7.0)
Neutrophils: 54 %
Platelets: 242 10*3/uL (ref 150–379)
RBC: 4.41 x10E6/uL (ref 3.77–5.28)
RDW: 14 % (ref 12.3–15.4)
WBC: 3.6 10*3/uL (ref 3.4–10.8)

## 2017-02-20 LAB — BMP8+EGFR
BUN/Creatinine Ratio: 26 (ref 12–28)
BUN: 18 mg/dL (ref 8–27)
CO2: 28 mmol/L (ref 18–29)
Calcium: 9.7 mg/dL (ref 8.7–10.3)
Chloride: 100 mmol/L (ref 96–106)
Creatinine, Ser: 0.68 mg/dL (ref 0.57–1.00)
GFR calc Af Amer: 101 mL/min/{1.73_m2} (ref >59)
GFR calc non Af Amer: 88 mL/min/{1.73_m2} (ref >59)
Glucose: 104 mg/dL — ABNORMAL HIGH (ref 65–99)
Potassium: 4.5 mmol/L (ref 3.5–5.2)
Sodium: 140 mmol/L (ref 134–144)

## 2017-02-20 LAB — NMR, LIPOPROFILE
Cholesterol: 178 mg/dL (ref 100–199)
HDL Cholesterol by NMR: 84 mg/dL (ref >39)
HDL Particle Number: 37 umol/L (ref >=30.5)
LDL Particle Number: 639 nmol/L (ref <1000)
LDL Size: 21 nm (ref >20.5)
LDL-C: 83 mg/dL (ref 0–99)
LP-IR Score: <25 (ref <=45)
Small LDL Particle Number: <90 nmol/L (ref <=527)
Triglycerides by NMR: 54 mg/dL (ref 0–149)

## 2017-02-20 LAB — HEPATIC FUNCTION PANEL
ALT: 36 IU/L — ABNORMAL HIGH (ref 0–32)
AST: 38 IU/L (ref 0–40)
Albumin: 4.6 g/dL (ref 3.5–4.8)
Alkaline Phosphatase: 70 IU/L (ref 39–117)
Bilirubin Total: 0.6 mg/dL (ref 0.0–1.2)
Bilirubin, Direct: 0.19 mg/dL (ref 0.00–0.40)
Total Protein: 7 g/dL (ref 6.0–8.5)

## 2017-02-20 LAB — VITAMIN D 25 HYDROXY (VIT D DEFICIENCY, FRACTURES): Vit D, 25-Hydroxy: 54.9 ng/mL (ref 30.0–100.0)

## 2017-03-24 IMAGING — MG MM SCREENING BREAST TOMO BILATERAL
8 of 13 series · 8 of 29 positions shown · non-contrast
Comparison: Previous exam(s).

CLINICAL DATA: Screening.

EXAM:
2D DIGITAL SCREENING BILATERAL MAMMOGRAM WITH CAD AND ADJUNCT TOMO

[L CC (1 of 2)]
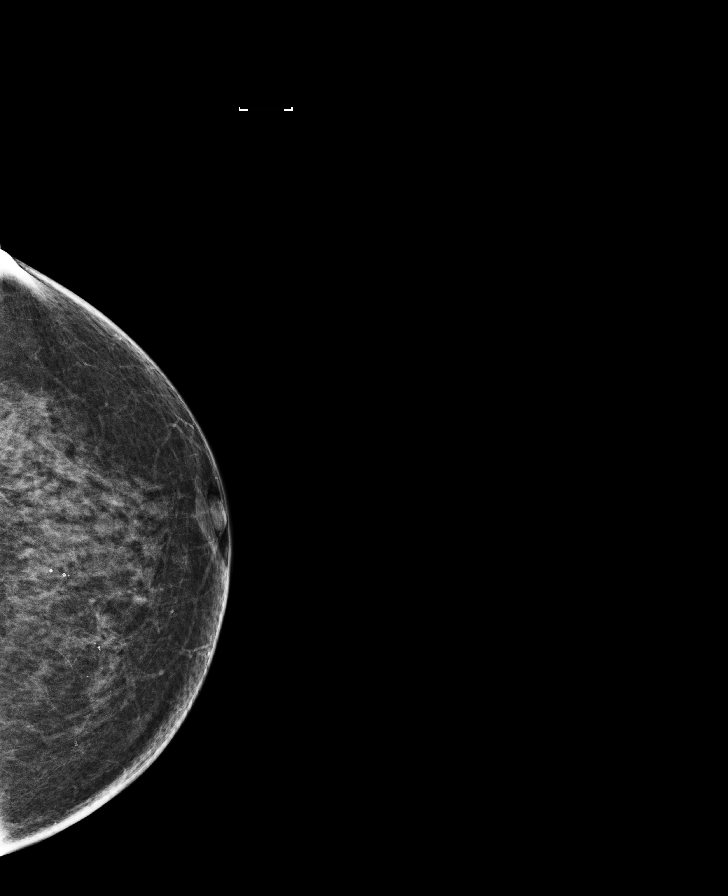

[L MLO synth-2D]
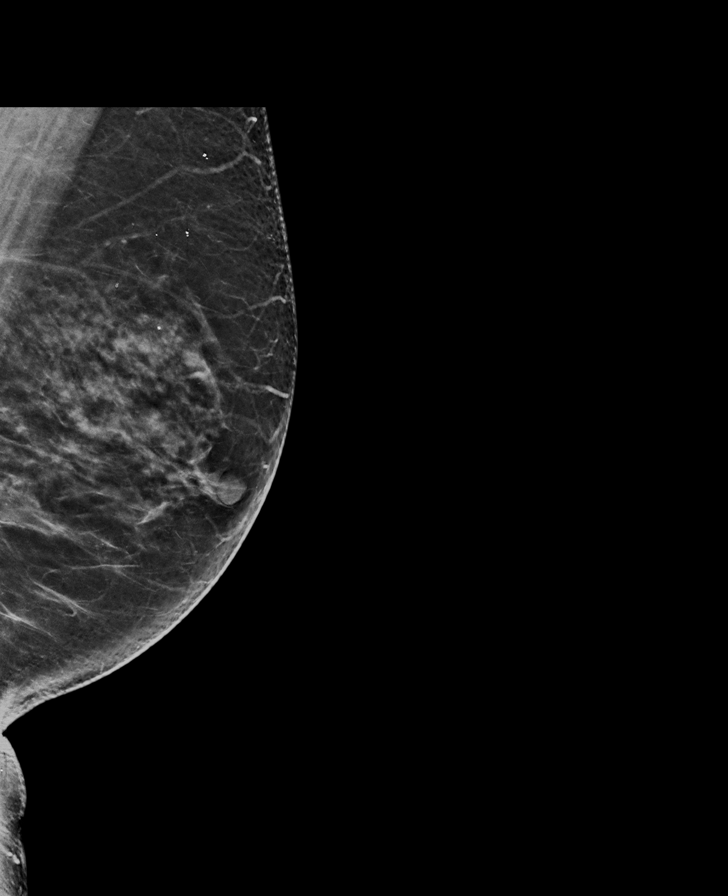

[R MLO synth-2D]
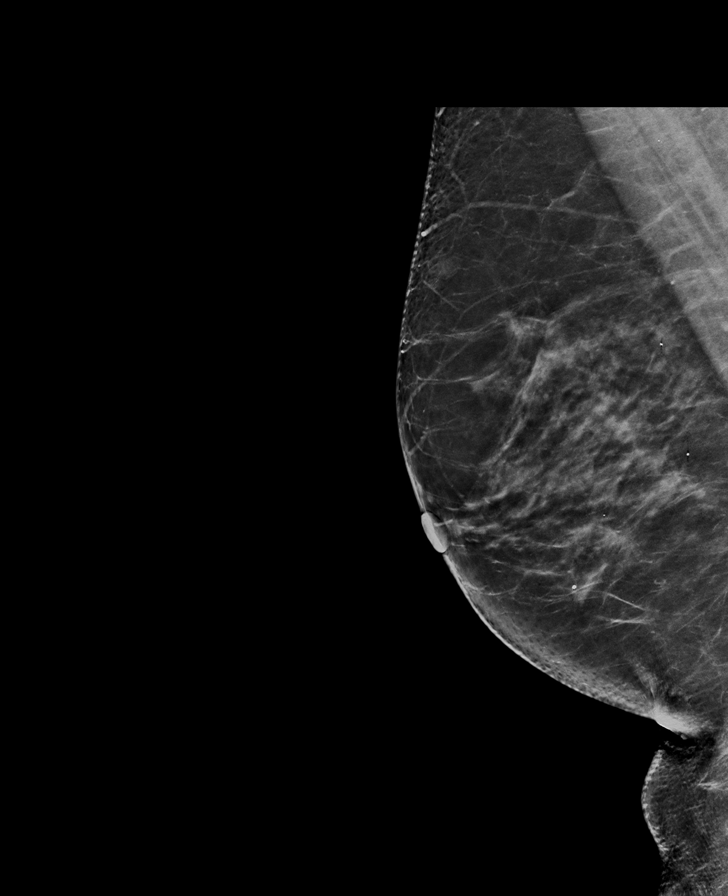

[R MLO]
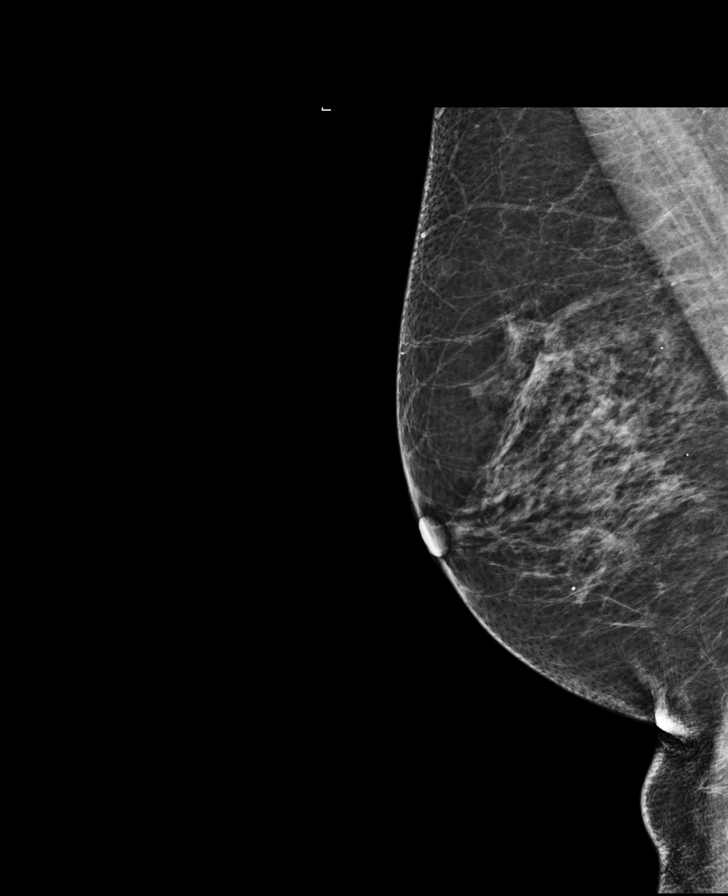

[L MLO]
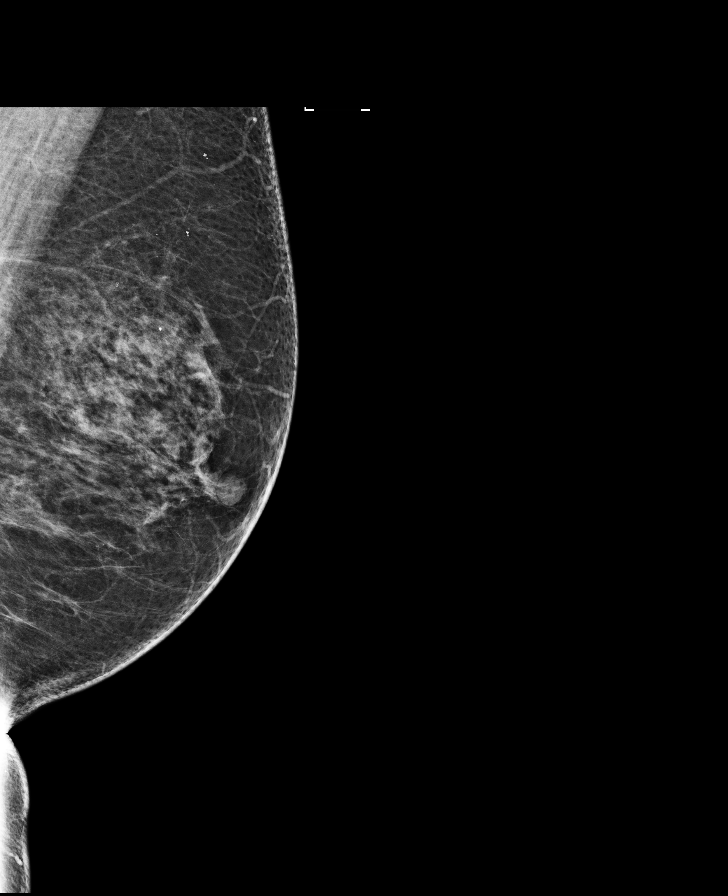

[R CC]
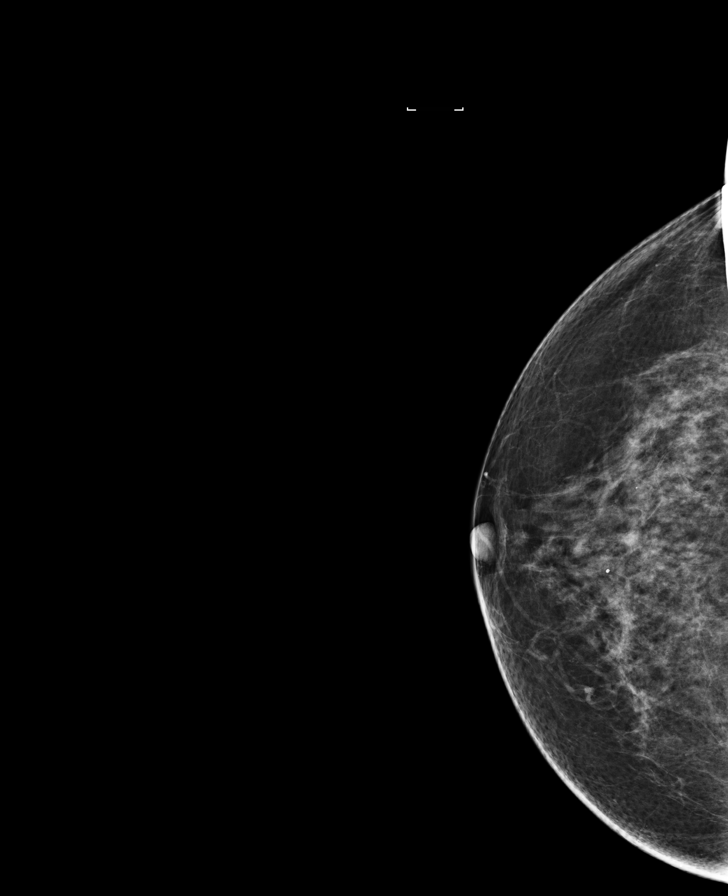

[L CC (2 of 2)]
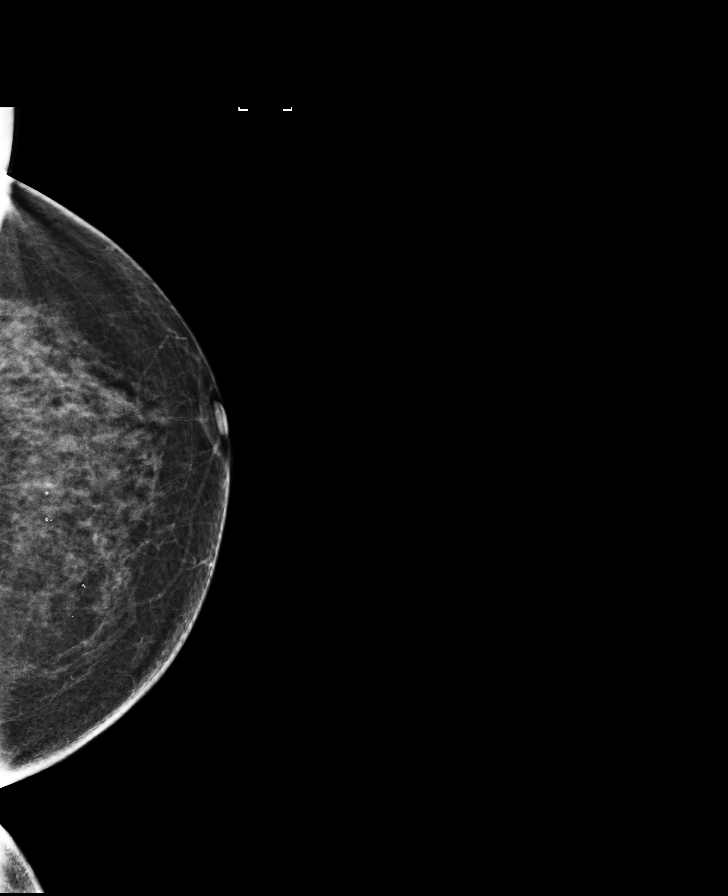

[L CC synth-2D]
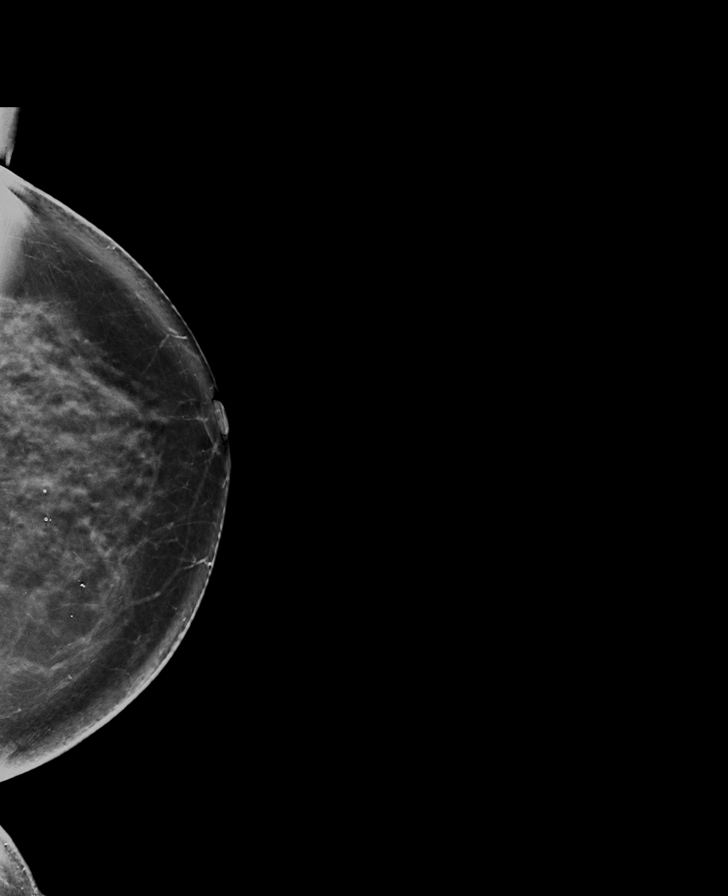

[8 of 29 positions shown; findings below may reference images not displayed]

ACR Breast Density Category b: There are scattered areas of
fibroglandular density.
FINDINGS: There are no findings suspicious for malignancy. Images were
processed with CAD.
IMPRESSION: No mammographic evidence of malignancy. A result letter of this
screening mammogram will be mailed directly to the patient.

RECOMMENDATION:
Screening mammogram in one year. (Code:97-6-RS4)

BI-RADS CATEGORY  1: Negative.

## 2017-03-25 DIAGNOSIS — H35031 Hypertensive retinopathy, right eye: Secondary | ICD-10-CM | POA: Diagnosis not present

## 2017-03-25 DIAGNOSIS — I10 Essential (primary) hypertension: Secondary | ICD-10-CM | POA: Diagnosis not present

## 2017-03-25 DIAGNOSIS — H524 Presbyopia: Secondary | ICD-10-CM | POA: Diagnosis not present

## 2017-03-25 DIAGNOSIS — H52223 Regular astigmatism, bilateral: Secondary | ICD-10-CM | POA: Diagnosis not present

## 2017-03-25 DIAGNOSIS — H5203 Hypermetropia, bilateral: Secondary | ICD-10-CM | POA: Diagnosis not present

## 2017-03-26 ENCOUNTER — Telehealth: Payer: Self-pay | Admitting: Family Medicine

## 2017-03-26 NOTE — Telephone Encounter (Signed)
DR Donato Heinz - she seen him yesterday and he will send [facility] a note. The "flow" in her eye (the vessels) is not what it should be. There had been a slow bleed - it was better.

## 2017-03-27 DIAGNOSIS — L820 Inflamed seborrheic keratosis: Secondary | ICD-10-CM | POA: Diagnosis not present

## 2017-03-27 DIAGNOSIS — C44712 Basal cell carcinoma of skin of right lower limb, including hip: Secondary | ICD-10-CM | POA: Diagnosis not present

## 2017-03-28 DIAGNOSIS — C44712 Basal cell carcinoma of skin of right lower limb, including hip: Secondary | ICD-10-CM | POA: Diagnosis not present

## 2017-04-03 DIAGNOSIS — C44712 Basal cell carcinoma of skin of right lower limb, including hip: Secondary | ICD-10-CM | POA: Diagnosis not present

## 2017-04-07 DIAGNOSIS — L080 Pyoderma: Secondary | ICD-10-CM | POA: Diagnosis not present

## 2017-05-06 ENCOUNTER — Other Ambulatory Visit: Payer: Self-pay | Admitting: Family Medicine

## 2017-05-20 DIAGNOSIS — L905 Scar conditions and fibrosis of skin: Secondary | ICD-10-CM | POA: Diagnosis not present

## 2017-05-20 DIAGNOSIS — Z85828 Personal history of other malignant neoplasm of skin: Secondary | ICD-10-CM | POA: Diagnosis not present

## 2017-06-25 ENCOUNTER — Ambulatory Visit (INDEPENDENT_AMBULATORY_CARE_PROVIDER_SITE_OTHER): Payer: Medicare Other | Admitting: Family Medicine

## 2017-06-25 ENCOUNTER — Encounter: Payer: Self-pay | Admitting: Family Medicine

## 2017-06-25 VITALS — BP 118/74 | HR 64 | Temp 97.2°F | Ht 68.0 in | Wt 191.0 lb

## 2017-06-25 DIAGNOSIS — E7800 Pure hypercholesterolemia, unspecified: Secondary | ICD-10-CM | POA: Diagnosis not present

## 2017-06-25 DIAGNOSIS — F339 Major depressive disorder, recurrent, unspecified: Secondary | ICD-10-CM | POA: Diagnosis not present

## 2017-06-25 DIAGNOSIS — R6889 Other general symptoms and signs: Secondary | ICD-10-CM | POA: Diagnosis not present

## 2017-06-25 DIAGNOSIS — F419 Anxiety disorder, unspecified: Secondary | ICD-10-CM

## 2017-06-25 DIAGNOSIS — I341 Nonrheumatic mitral (valve) prolapse: Secondary | ICD-10-CM

## 2017-06-25 DIAGNOSIS — E559 Vitamin D deficiency, unspecified: Secondary | ICD-10-CM

## 2017-06-25 LAB — URINALYSIS, COMPLETE
Bilirubin, UA: NEGATIVE
Glucose, UA: NEGATIVE
Ketones, UA: NEGATIVE
Nitrite, UA: NEGATIVE
Protein, UA: NEGATIVE
Specific Gravity, UA: 1.01 (ref 1.005–1.030)
Urobilinogen, Ur: 0.2 mg/dL (ref 0.2–1.0)
pH, UA: 5.5 (ref 5.0–7.5)

## 2017-06-25 LAB — MICROSCOPIC EXAMINATION
Epithelial Cells (non renal): >10 /hpf — AB (ref 0–10)
Renal Epithel, UA: NONE SEEN /hpf

## 2017-06-25 MED ORDER — FLUTICASONE PROPIONATE 50 MCG/ACT NA SUSP: 2.0000 | Freq: Every day | NASAL | 6 refills | Status: DC

## 2017-06-25 MED ORDER — SERTRALINE HCL 50 MG PO TABS: 50.0000 mg | ORAL_TABLET | Freq: Every day | ORAL | 3 refills | Status: DC

## 2017-06-25 NOTE — Patient Instructions (Addendum)
Medicare Annual Wellness Visit  [facility] and the medical providers at [facility] strive to bring you the best medical care.  In doing so we not only want to address your current medical conditions and concerns but also to detect new conditions early and prevent illness, disease and health-related problems.    Medicare offers a yearly Wellness Visit which allows our clinical staff to assess your need for preventative services including immunizations, lifestyle education, counseling to decrease risk of preventable diseases and screening for fall risk and other medical concerns.    This visit is provided free of charge (no copay) for all Medicare recipients. The clinical pharmacists at [facility] have begun to conduct these Wellness Visits which will also include a thorough review of all your medications.    As you primary medical provider recommend that you make an appointment for your Annual Wellness Visit if you have not done so already this year.  You may set up this appointment before you leave today or you may call back ([phone]) and schedule an appointment.  Please make sure when you call that you mention that you are scheduling your Annual Wellness Visit with the clinical pharmacist so that the appointment may be made for the proper length of time.     Continue current medications. Continue good therapeutic lifestyle changes which include good diet and exercise. Fall precautions discussed with patient. If an FOBT was given today- please return it to our front desk. If you are over 54 years old - you may need Prevnar 17 or the adult Pneumonia vaccine.  **Flu shots are available--- please call and schedule a FLU-CLINIC appointment**  After your visit with [place] today you will receive a survey in the mail or online from [employer] regarding your care with [place]. Please take a moment to fill this out. Your feedback is very  important to [place] as you can help [place] better understand your patient needs as well as improve your experience and satisfaction. WE CARE ABOUT YOU!!!  Use Flonase as directed 1-2 sprays each nostril at bedtime Use a nonsedating antihistamine if additional medicine as needed for allergies like Claritin Continue to drink plenty of fluids and stay well hydrated Avoid irritating environments as much as possible Call in 4-5 weeks regarding the impact of getting back on Zoloft and if this is help the stress and the anxiety more You do not need your next pelvic exam until the spring of 2019

## 2017-06-25 NOTE — Progress Notes (Signed)
Subjective:    Patient ID: Kelly George, female    DOB: 06-22-1946, 71 y.o.   MRN: [id number]  HPI Pt here for follow up and management of chronic medical problems which includes hyperlipidemia. She is taking medication regularly.A shot today complains of seasonal allergies. She also complains of some confusion at times and says she feels somewhat depressed. Her MMSE was done and she scored 28 out of 30. She will get lab work today. We will make sure that we get a B12 level and a thyroid panel on her. In the past she has taken an antidepressant and it may be prudent to restart this or increase the dose. She is due to get her DEXA scan and her mammogram and gets this at [facility]. The patient denies any chest pain or shortness of breath. She's resting well but does have some insomnia and wakes up at least once to go to the bathroom. She has no trouble with her stomach as far as nausea vomiting diarrhea blood in the stool or black tarry bowel movements. She does have some frequency during the day and has nocturia times 1 at night. As far as her depression she has had problems with this in the past and of course lives by herself but close to her daughter. We've had to go on and off the Zoloft in the past and we will restart this. She says she has some confusion at times keeping up with things in doing food and meals and following directions well but she also feels she is under a lot of stress. We will restart the Zoloft and hopefully help with the confusion will come with that. Will also get her started on some Flonase and we will ask her to leave off the sedating antihistamines and only take something like Claritin with the Flonase. We will also add a B12 level and thyroid profile and a urinalysis to the lab work she is going to get today.    Patient Active Problem List   Diagnosis Date Noted  . Recurrent major depressive disorder, in full remission ([place]) 02/19/2017  . Precordial chest pain  07/07/2015  . Pain in joint of right foot 05/16/2014  . Vitamin D deficiency 07/08/2013  . Mitral valve prolapse syndrome, history of mild 01/05/2013  . Seasonal allergies   . Anxiety   . Depression   . Hyperlipidemia    Outpatient Encounter Prescriptions as of 06/25/2017  Medication Sig  . ALPRAZolam (XANAX) 0.25 MG tablet Take 1 tablet (0.25 mg total) by mouth at bedtime as needed for sleep.  . calcium-vitamin D (OSCAL WITH D) 500-200 MG-UNIT per tablet Take 1 tablet by mouth daily.  . Cholecalciferol (VITAMIN D3) 2000 UNITS TABS Take 5,000 Units by mouth daily.   . fish oil-omega-3 fatty acids 1000 MG capsule Take 1,200 mg by mouth daily.   Marland Kitchen glucosamine-chondroitin 500-400 MG tablet Take 1 tablet by mouth 2 (two) times daily.   . meloxicam (MOBIC) 15 MG tablet 15 mg daily as needed for pain.  . naproxen sodium (ANAPROX) 220 MG tablet Take 220 mg by mouth daily. TAKE TWO TABLETS ONCE DAILY  . simvastatin (ZOCOR) 10 MG tablet Take 10 mg by mouth daily.  . [DISCONTINUED] sertraline (ZOLOFT) 50 MG tablet Take 1 tablet (50 mg total) by mouth daily.  . cetirizine (ZYRTEC) 10 MG tablet Take 10 mg by mouth daily. Reported on 01/05/2016  . [DISCONTINUED] simvastatin (ZOCOR) 10 MG tablet TAKE 1 TABLET (10 MG TOTAL)  BY MOUTH AT BEDTIME.   No facility-administered encounter medications on file as of 06/25/2017.       Review of Systems  Constitutional: Negative.   HENT: Negative.        Seasonal allergies  Eyes: Negative.   Respiratory: Negative.   Cardiovascular: Negative.   Gastrointestinal: Negative.   Endocrine: Negative.   Genitourinary: Negative.   Musculoskeletal: Negative.   Skin: Negative.   Allergic/Immunologic: Negative.   Neurological: Negative.   Hematological: Negative.   Psychiatric/Behavioral: Positive for confusion (at times // get frustrated easily ).       Depressed (stopped zoloft 4 mos ago)   MMSE score today 28/30.    Objective:   Physical Exam    Constitutional: She is oriented to person, place, and time. She appears well-developed and well-nourished. No distress.  HENT:  Head: Normocephalic and atraumatic.  Right Ear: External ear normal.  Left Ear: External ear normal.  Nose: Nose normal.  Mouth/Throat: Oropharynx is clear and moist. No oropharyngeal exudate.  Eyes: Pupils are equal, round, and reactive to light. Conjunctivae and EOM are normal. Right eye exhibits no discharge. Left eye exhibits no discharge. No scleral icterus.  Neck: Normal range of motion. Neck supple. No thyromegaly present.  No bruits thyromegaly or anterior cervical adenopathy  Cardiovascular: Normal rate, regular rhythm, normal heart sounds and intact distal pulses.   No murmur heard. The heart is regular at 60/m  Pulmonary/Chest: Effort normal and breath sounds normal. No respiratory distress. She has no wheezes. She has no rales.  Clear anteriorly and posteriorly  Abdominal: Soft. Bowel sounds are normal. She exhibits no mass. There is no tenderness. There is no rebound and no guarding.  No organ enlargement no epigastric tenderness no masses and no bruits and no inguinal adenopathy  Musculoskeletal: Normal range of motion. She exhibits no edema.  Lymphadenopathy:    She has no cervical adenopathy.  Neurological: She is alert and oriented to person, place, and time. She has normal reflexes. No cranial nerve deficit.  Skin: Skin is warm and dry. No rash noted.  Psychiatric: She has a normal mood and affect. Her behavior is normal. Judgment and thought content normal.  Nursing note and vitals reviewed.   BP 118/74 (BP Location: Left Arm)   Pulse 64   Temp (!) 97.2 F (36.2 C) (Oral)   Ht 5\' 8"  (1.727 m)   Wt 191 lb (86.6 kg)   BMI 29.04 kg/m        Assessment & Plan:  1. Pure hypercholesterolemia -Continue current treatment and aggressive therapeutic lifestyle changes pending results of lab work  2. Vitamin D deficiency -Continue current  treatment pending results of lab work  3. Mitral valve prolapse syndrome, history of mild -Follow-up with cardiology as needed  4. Anxiety -Restart Zoloft and stay active physically and drink plenty of fluids  5. Depression, recurrent ([place]) -Restart Zoloft as planned  6. Forgetfulness -We will continue to monitor this. Follow through with plan of action from office visit -Call us in 4-6 weeks regarding how things are going at that time  Patient Instructions                       Medicare Annual Wellness Visit  [facility] and the medical providers at [facility] strive to bring you the best medical care.  In doing so we not only want to address your current medical conditions and concerns but also to detect new  conditions early and prevent illness, disease and health-related problems.    Medicare offers a yearly Wellness Visit which allows our clinical staff to assess your need for preventative services including immunizations, lifestyle education, counseling to decrease risk of preventable diseases and screening for fall risk and other medical concerns.    This visit is provided free of charge (no copay) for all Medicare recipients. The clinical pharmacists at [facility] have begun to conduct these Wellness Visits which will also include a thorough review of all your medications.    As you primary medical provider recommend that you make an appointment for your Annual Wellness Visit if you have not done so already this year.  You may set up this appointment before you leave today or you may call back ([phone]) and schedule an appointment.  Please make sure when you call that you mention that you are scheduling your Annual Wellness Visit with the clinical pharmacist so that the appointment may be made for the proper length of time.     Continue current medications. Continue good therapeutic lifestyle changes which include good diet and  exercise. Fall precautions discussed with patient. If an FOBT was given today- please return it to our front desk. If you are over 63 years old - you may need Prevnar 36 or the adult Pneumonia vaccine.  **Flu shots are available--- please call and schedule a FLU-CLINIC appointment**  After your visit with [place] today you will receive a survey in the mail or online from [employer] regarding your care with [place]. Please take a moment to fill this out. Your feedback is very important to [place] as you can help [place] better understand your patient needs as well as improve your experience and satisfaction. WE CARE ABOUT YOU!!!  Use Flonase as directed 1-2 sprays each nostril at bedtime Use a nonsedating antihistamine if additional medicine as needed for allergies like Claritin Continue to drink plenty of fluids and stay well hydrated Avoid irritating environments as much as possible Call in 4-5 weeks regarding the impact of getting back on Zoloft and if this is help the stress and the anxiety more You do not need your next pelvic exam until the spring of 2019    Arrie Senate MD

## 2017-06-25 NOTE — Addendum Note (Signed)
Addended by: Zannie Cove on: 06/25/2017 11:11 AM   Modules accepted: Orders

## 2017-06-26 ENCOUNTER — Encounter: Payer: Self-pay | Admitting: *Deleted

## 2017-06-26 LAB — VITAMIN D 25 HYDROXY (VIT D DEFICIENCY, FRACTURES): Vit D, 25-Hydroxy: 47.4 ng/mL (ref 30.0–100.0)

## 2017-06-26 LAB — CBC WITH DIFFERENTIAL/PLATELET
Basophils Absolute: 0 10*3/uL (ref 0.0–0.2)
Basos: 0 %
EOS (ABSOLUTE): 0.1 10*3/uL (ref 0.0–0.4)
Eos: 4 %
Hematocrit: 41.2 % (ref 34.0–46.6)
Hemoglobin: 14.1 g/dL (ref 11.1–15.9)
Immature Grans (Abs): 0 10*3/uL (ref 0.0–0.1)
Immature Granulocytes: 0 %
Lymphocytes Absolute: 1.3 10*3/uL (ref 0.7–3.1)
Lymphs: 34 %
MCH: 31.8 pg (ref 26.6–33.0)
MCHC: 34.2 g/dL (ref 31.5–35.7)
MCV: 93 fL (ref 79–97)
Monocytes Absolute: 0.5 10*3/uL (ref 0.1–0.9)
Monocytes: 12 %
Neutrophils Absolute: 1.9 10*3/uL (ref 1.4–7.0)
Neutrophils: 50 %
Platelets: 239 10*3/uL (ref 150–379)
RBC: 4.44 x10E6/uL (ref 3.77–5.28)
RDW: 14.1 % (ref 12.3–15.4)
WBC: 3.9 10*3/uL (ref 3.4–10.8)

## 2017-06-26 LAB — BMP8+EGFR
BUN/Creatinine Ratio: 29 — ABNORMAL HIGH (ref 12–28)
BUN: 18 mg/dL (ref 8–27)
CO2: 23 mmol/L (ref 20–29)
Calcium: 9.8 mg/dL (ref 8.7–10.3)
Chloride: 103 mmol/L (ref 96–106)
Creatinine, Ser: 0.62 mg/dL (ref 0.57–1.00)
GFR calc Af Amer: 103 mL/min/{1.73_m2} (ref >59)
GFR calc non Af Amer: 90 mL/min/{1.73_m2} (ref >59)
Glucose: 94 mg/dL (ref 65–99)
Potassium: 4 mmol/L (ref 3.5–5.2)
Sodium: 144 mmol/L (ref 134–144)

## 2017-06-26 LAB — HEPATIC FUNCTION PANEL
ALT: 37 IU/L — ABNORMAL HIGH (ref 0–32)
AST: 32 IU/L (ref 0–40)
Albumin: 4.7 g/dL (ref 3.5–4.8)
Alkaline Phosphatase: 82 IU/L (ref 39–117)
Bilirubin Total: 0.9 mg/dL (ref 0.0–1.2)
Bilirubin, Direct: 0.21 mg/dL (ref 0.00–0.40)
Total Protein: 7.2 g/dL (ref 6.0–8.5)

## 2017-06-26 LAB — PLEASE NOTE

## 2017-06-26 LAB — THYROID PANEL WITH TSH
Free Thyroxine Index: 1.9 (ref 1.2–4.9)
T3 Uptake Ratio: 26 % (ref 24–39)
T4, Total: 7.3 ug/dL (ref 4.5–12.0)
TSH: 2.95 u[IU]/mL (ref 0.450–4.500)

## 2017-06-26 LAB — LIPID PANEL
Chol/HDL Ratio: 2 ratio (ref 0.0–4.4)
Cholesterol, Total: 164 mg/dL (ref 100–199)
HDL: 84 mg/dL (ref >39)
LDL Calculated: 68 mg/dL (ref 0–99)
Triglycerides: 61 mg/dL (ref 0–149)
VLDL Cholesterol Cal: 12 mg/dL (ref 5–40)

## 2017-06-26 LAB — VITAMIN B12: Vitamin B-12: 479 pg/mL (ref 232–1245)

## 2017-08-04 ENCOUNTER — Other Ambulatory Visit (INDEPENDENT_AMBULATORY_CARE_PROVIDER_SITE_OTHER): Payer: Medicare Other

## 2017-08-04 DIAGNOSIS — Z23 Encounter for immunization: Secondary | ICD-10-CM | POA: Diagnosis not present

## 2017-08-04 DIAGNOSIS — R300 Dysuria: Secondary | ICD-10-CM | POA: Diagnosis not present

## 2017-08-04 LAB — MICROSCOPIC EXAMINATION
Epithelial Cells (non renal): >10 /hpf — AB (ref 0–10)
RBC, UA: NONE SEEN /hpf (ref 0–?)
Renal Epithel, UA: NONE SEEN /hpf

## 2017-08-04 LAB — URINALYSIS, COMPLETE
Bilirubin, UA: NEGATIVE
Glucose, UA: NEGATIVE
Ketones, UA: NEGATIVE
Nitrite, UA: NEGATIVE
Protein, UA: NEGATIVE
RBC, UA: NEGATIVE
Specific Gravity, UA: <1.005 — ABNORMAL LOW (ref 1.005–1.030)
Urobilinogen, Ur: 0.2 mg/dL (ref 0.2–1.0)
pH, UA: 6 (ref 5.0–7.5)

## 2017-08-07 LAB — URINE CULTURE

## 2017-09-12 DIAGNOSIS — D225 Melanocytic nevi of trunk: Secondary | ICD-10-CM | POA: Diagnosis not present

## 2017-09-12 DIAGNOSIS — Z85828 Personal history of other malignant neoplasm of skin: Secondary | ICD-10-CM | POA: Diagnosis not present

## 2017-09-12 DIAGNOSIS — L814 Other melanin hyperpigmentation: Secondary | ICD-10-CM | POA: Diagnosis not present

## 2017-09-12 DIAGNOSIS — L821 Other seborrheic keratosis: Secondary | ICD-10-CM | POA: Diagnosis not present

## 2017-11-07 ENCOUNTER — Encounter: Payer: Self-pay | Admitting: Family Medicine

## 2017-11-07 ENCOUNTER — Ambulatory Visit (INDEPENDENT_AMBULATORY_CARE_PROVIDER_SITE_OTHER): Payer: Medicare Other | Admitting: Family Medicine

## 2017-11-07 VITALS — BP 122/79 | HR 65 | Temp 98.1°F | Ht 68.0 in | Wt 191.6 lb

## 2017-11-07 DIAGNOSIS — E7849 Other hyperlipidemia: Secondary | ICD-10-CM | POA: Diagnosis not present

## 2017-11-07 DIAGNOSIS — F419 Anxiety disorder, unspecified: Secondary | ICD-10-CM

## 2017-11-07 DIAGNOSIS — R5383 Other fatigue: Secondary | ICD-10-CM

## 2017-11-07 DIAGNOSIS — H547 Unspecified visual loss: Secondary | ICD-10-CM

## 2017-11-07 DIAGNOSIS — F339 Major depressive disorder, recurrent, unspecified: Secondary | ICD-10-CM | POA: Diagnosis not present

## 2017-11-07 DIAGNOSIS — R3915 Urgency of urination: Secondary | ICD-10-CM | POA: Diagnosis not present

## 2017-11-07 LAB — URINALYSIS, COMPLETE
Bilirubin, UA: NEGATIVE
Glucose, UA: NEGATIVE
Ketones, UA: NEGATIVE
Leukocytes, UA: NEGATIVE
Nitrite, UA: NEGATIVE
Protein, UA: NEGATIVE
RBC, UA: NEGATIVE
Specific Gravity, UA: 1.01 (ref 1.005–1.030)
Urobilinogen, Ur: 0.2 mg/dL (ref 0.2–1.0)
pH, UA: 6.5 (ref 5.0–7.5)

## 2017-11-07 LAB — MICROSCOPIC EXAMINATION: Renal Epithel, UA: NONE SEEN /hpf

## 2017-11-07 MED ORDER — MIRABEGRON ER 25 MG PO TB24: 25.0000 mg | ORAL_TABLET | Freq: Every day | ORAL | 5 refills | Status: DC

## 2017-11-07 NOTE — Progress Notes (Signed)
Subjective:    Patient ID: Kelly George, female    DOB: Jul 15, 1946, 72 y.o.   MRN: [id number]  HPI Pt here today for follow up of chronic medical conditions which include hyperlipidemia, depression, allergies and arthritis.  The patient today does complain of some urinary urgency.  She continues to have congestion and drainage.  She has some dizziness sometimes with movement and complains of fatigue.  Because of the urgency with passing her water we will get a urinalysis.  The patient denies any chest pain or shortness of breath that is of any significant change.  She says she may be slightly more shortness of breath.  She does still have occasional palpitations.  She takes Zantac on an as-needed basis for spicy food and between those kinds of meals she does not have any reflux or heartburn.  She denies any change in bowel and is not seen any blood in the stool.  She will be given an FOBT to return in February.  She denies any burning with voiding just the urgency and this is been going on for quite a while.  We will try the Myrbetriq and see how she does with that if she continues to have problems get her an appointment with the urologist.   Review of Systems  Constitutional: Positive for fatigue (sleeps alot).  HENT: Positive for postnasal drip (slight).   Gastrointestinal: Negative.   Genitourinary: Positive for urgency.  Musculoskeletal: Positive for arthralgias (R ankle, intermitent).  Neurological: Positive for dizziness (with movement).       Objective:   Physical Exam  Constitutional: She is oriented to person, place, and time. She appears well-developed and well-nourished. No distress.  The patient is pleasant and relaxed and still continues to take her Zoloft which she says it is hard for her to get off because she needs this.  HENT:  Head: Normocephalic and atraumatic.  Right Ear: External ear normal.  Left Ear: External ear normal.  Mouth/Throat: Oropharynx is clear and  moist. No oropharyngeal exudate.  Slight nasal congestion right greater than left  Eyes: Conjunctivae and EOM are normal. Pupils are equal, round, and reactive to light. Right eye exhibits no discharge. Left eye exhibits no discharge. No scleral icterus.  Redundant upper eyelid skin over both eyes could certainly impair her vision.  Neck: Normal range of motion. Neck supple. No thyromegaly present.  No bruits thyromegaly or anterior cervical adenopathy  Cardiovascular: Normal rate, regular rhythm, normal heart sounds and intact distal pulses.  No murmur heard. Heart is regular at 72/min  Pulmonary/Chest: Effort normal and breath sounds normal. No respiratory distress. She has no wheezes. She has no rales.  Clear anteriorly and posteriorly  Abdominal: Soft. Bowel sounds are normal. She exhibits no mass. There is no tenderness. There is no rebound and no guarding.  No abdominal tenderness masses bruits or organ enlargement  Musculoskeletal: Normal range of motion. She exhibits no edema.  Lymphadenopathy:    She has no cervical adenopathy.  Neurological: She is alert and oriented to person, place, and time. She has normal reflexes. No cranial nerve deficit.  Skin: Skin is warm and dry. No rash noted.  Skin on the back is clear  Psychiatric: She has a normal mood and affect. Her behavior is normal. Judgment and thought content normal.  Nursing note and vitals reviewed.  BP 122/79 (BP Location: Left Arm, Patient Position: Sitting, Cuff Size: Normal)   Pulse 65   Temp 98.1 F (36.7 C) (Oral)  Ht 5\' 8"  (1.727 m)   Wt 191 lb 9.6 oz (86.9 kg)   BMI 29.13 kg/m         Assessment & Plan:  1. Other fatigue - CBC with Differential/Platelet  2. Other hyperlipidemia -Continue aggressive therapeutic lifestyle changes and current treatment pending results of lab work - Basic Metabolic Panel - Hepatic function panel - Lipid panel  3. Urinary urgency -The urinalysis today was within  normal and we will try Myrbetriq 25 mg 1 daily to see if this will help her symptoms. - Urinalysis, Routine w reflex microscopic - Urinalysis, Complete  4. Anxiety -Continue with Zoloft  5. Depression, recurrent ([place]) -Continue with Zoloft  6. Visual impairment - Ambulatory referral to Ophthalmology  Patient Instructions  Because of the redundant eyelid skin we will arrange for you to have a visit with [facility] eye physicians in [place] on [address] We will give you a prescription for Myrbetriq which is for urinary incontinence and urgency and it is one 25 mg p.o. Daily She should have her daughter pay particular attention to her breathing at nighttime especially as when she is staying with her. She should get back in touch with [place] if there is a lot of snoring or sleep apnea episodes. Continue with Zoloft as you are currently doing The patient also has been told that she has narrowed vessels on the back of her eyes.  She has 2 reasons for seeing the ophthalmologist.  Arrie Senate MD

## 2017-11-07 NOTE — Patient Instructions (Addendum)
Because of the redundant eyelid skin we will arrange for you to have a visit with [facility] eye physicians in [place] on [address] We will give you a prescription for Myrbetriq which is for urinary incontinence and urgency and it is one 25 mg p.o. Daily She should have her daughter pay particular attention to her breathing at nighttime especially as when she is staying with her. She should get back in touch with [place] if there is a lot of snoring or sleep apnea episodes. Continue with Zoloft as you are currently doing The patient also has been told that she has narrowed vessels on the back of her eyes.  She has 2 reasons for seeing the ophthalmologist.

## 2017-11-08 LAB — CBC WITH DIFFERENTIAL/PLATELET
Basophils Absolute: 0 10*3/uL (ref 0.0–0.2)
Basos: 0 %
EOS (ABSOLUTE): 0.1 10*3/uL (ref 0.0–0.4)
Eos: 3 %
Hematocrit: 41.9 % (ref 34.0–46.6)
Hemoglobin: 14 g/dL (ref 11.1–15.9)
Immature Grans (Abs): 0 10*3/uL (ref 0.0–0.1)
Immature Granulocytes: 0 %
Lymphocytes Absolute: 1.1 10*3/uL (ref 0.7–3.1)
Lymphs: 29 %
MCH: 31.5 pg (ref 26.6–33.0)
MCHC: 33.4 g/dL (ref 31.5–35.7)
MCV: 94 fL (ref 79–97)
Monocytes Absolute: 0.5 10*3/uL (ref 0.1–0.9)
Monocytes: 13 %
Neutrophils Absolute: 2.1 10*3/uL (ref 1.4–7.0)
Neutrophils: 55 %
Platelets: 232 10*3/uL (ref 150–379)
RBC: 4.45 x10E6/uL (ref 3.77–5.28)
RDW: 13.8 % (ref 12.3–15.4)
WBC: 3.9 10*3/uL (ref 3.4–10.8)

## 2017-11-08 LAB — LIPID PANEL
Chol/HDL Ratio: 2.1 ratio (ref 0.0–4.4)
Cholesterol, Total: 186 mg/dL (ref 100–199)
HDL: 88 mg/dL (ref >39)
LDL Calculated: 84 mg/dL (ref 0–99)
Triglycerides: 72 mg/dL (ref 0–149)
VLDL Cholesterol Cal: 14 mg/dL (ref 5–40)

## 2017-11-08 LAB — HEPATIC FUNCTION PANEL
ALT: 35 IU/L — ABNORMAL HIGH (ref 0–32)
AST: 30 IU/L (ref 0–40)
Albumin: 4.7 g/dL (ref 3.5–4.8)
Alkaline Phosphatase: 84 IU/L (ref 39–117)
Bilirubin Total: 0.7 mg/dL (ref 0.0–1.2)
Bilirubin, Direct: 0.21 mg/dL (ref 0.00–0.40)
Total Protein: 7.3 g/dL (ref 6.0–8.5)

## 2017-11-08 LAB — BASIC METABOLIC PANEL
BUN/Creatinine Ratio: 34 — ABNORMAL HIGH (ref 12–28)
BUN: 21 mg/dL (ref 8–27)
CO2: 21 mmol/L (ref 20–29)
Calcium: 9.7 mg/dL (ref 8.7–10.3)
Chloride: 105 mmol/L (ref 96–106)
Creatinine, Ser: 0.62 mg/dL (ref 0.57–1.00)
GFR calc Af Amer: 103 mL/min/{1.73_m2} (ref >59)
GFR calc non Af Amer: 90 mL/min/{1.73_m2} (ref >59)
Glucose: 105 mg/dL — ABNORMAL HIGH (ref 65–99)
Potassium: 4.3 mmol/L (ref 3.5–5.2)
Sodium: 145 mmol/L — ABNORMAL HIGH (ref 134–144)

## 2017-11-10 ENCOUNTER — Other Ambulatory Visit: Payer: Self-pay | Admitting: *Deleted

## 2017-11-10 ENCOUNTER — Telehealth: Payer: Self-pay | Admitting: Family Medicine

## 2017-11-10 MED ORDER — MIRABEGRON ER 25 MG PO TB24: 25.0000 mg | ORAL_TABLET | Freq: Every day | ORAL | 5 refills | Status: DC

## 2017-11-10 NOTE — Telephone Encounter (Signed)
Oxybutynin 5 mg 1 at bedtime #30 refill as needed

## 2017-11-11 MED ORDER — OXYBUTYNIN CHLORIDE ER 5 MG PO TB24: 5.0000 mg | ORAL_TABLET | Freq: Every day | ORAL | 5 refills | Status: DC

## 2017-11-11 NOTE — Telephone Encounter (Signed)
Pt notified of RX Verbalizes understanding 

## 2017-11-20 ENCOUNTER — Other Ambulatory Visit: Payer: Self-pay | Admitting: Family Medicine

## 2017-12-03 DIAGNOSIS — M5116 Intervertebral disc disorders with radiculopathy, lumbar region: Secondary | ICD-10-CM | POA: Diagnosis not present

## 2017-12-03 DIAGNOSIS — M9903 Segmental and somatic dysfunction of lumbar region: Secondary | ICD-10-CM | POA: Diagnosis not present

## 2017-12-08 DIAGNOSIS — M9903 Segmental and somatic dysfunction of lumbar region: Secondary | ICD-10-CM | POA: Diagnosis not present

## 2017-12-08 DIAGNOSIS — M5116 Intervertebral disc disorders with radiculopathy, lumbar region: Secondary | ICD-10-CM | POA: Diagnosis not present

## 2017-12-10 DIAGNOSIS — M9903 Segmental and somatic dysfunction of lumbar region: Secondary | ICD-10-CM | POA: Diagnosis not present

## 2017-12-10 DIAGNOSIS — M5116 Intervertebral disc disorders with radiculopathy, lumbar region: Secondary | ICD-10-CM | POA: Diagnosis not present

## 2017-12-15 DIAGNOSIS — M5116 Intervertebral disc disorders with radiculopathy, lumbar region: Secondary | ICD-10-CM | POA: Diagnosis not present

## 2017-12-15 DIAGNOSIS — M9903 Segmental and somatic dysfunction of lumbar region: Secondary | ICD-10-CM | POA: Diagnosis not present

## 2017-12-17 DIAGNOSIS — M5116 Intervertebral disc disorders with radiculopathy, lumbar region: Secondary | ICD-10-CM | POA: Diagnosis not present

## 2017-12-17 DIAGNOSIS — M9903 Segmental and somatic dysfunction of lumbar region: Secondary | ICD-10-CM | POA: Diagnosis not present

## 2017-12-22 DIAGNOSIS — M5116 Intervertebral disc disorders with radiculopathy, lumbar region: Secondary | ICD-10-CM | POA: Diagnosis not present

## 2017-12-22 DIAGNOSIS — M9903 Segmental and somatic dysfunction of lumbar region: Secondary | ICD-10-CM | POA: Diagnosis not present

## 2017-12-23 ENCOUNTER — Other Ambulatory Visit: Payer: Medicare Other

## 2017-12-23 DIAGNOSIS — Z1211 Encounter for screening for malignant neoplasm of colon: Secondary | ICD-10-CM

## 2017-12-24 DIAGNOSIS — M9903 Segmental and somatic dysfunction of lumbar region: Secondary | ICD-10-CM | POA: Diagnosis not present

## 2017-12-24 DIAGNOSIS — M5116 Intervertebral disc disorders with radiculopathy, lumbar region: Secondary | ICD-10-CM | POA: Diagnosis not present

## 2017-12-24 LAB — FECAL OCCULT BLOOD, IMMUNOCHEMICAL: Fecal Occult Bld: NEGATIVE

## 2017-12-26 ENCOUNTER — Other Ambulatory Visit: Payer: Self-pay | Admitting: *Deleted

## 2017-12-26 MED ORDER — SERTRALINE HCL 50 MG PO TABS: 50.0000 mg | ORAL_TABLET | Freq: Every day | ORAL | 0 refills | Status: DC

## 2017-12-29 DIAGNOSIS — M9903 Segmental and somatic dysfunction of lumbar region: Secondary | ICD-10-CM | POA: Diagnosis not present

## 2017-12-29 DIAGNOSIS — M5116 Intervertebral disc disorders with radiculopathy, lumbar region: Secondary | ICD-10-CM | POA: Diagnosis not present

## 2018-01-05 DIAGNOSIS — M9903 Segmental and somatic dysfunction of lumbar region: Secondary | ICD-10-CM | POA: Diagnosis not present

## 2018-01-05 DIAGNOSIS — M5116 Intervertebral disc disorders with radiculopathy, lumbar region: Secondary | ICD-10-CM | POA: Diagnosis not present

## 2018-01-07 DIAGNOSIS — M9903 Segmental and somatic dysfunction of lumbar region: Secondary | ICD-10-CM | POA: Diagnosis not present

## 2018-01-07 DIAGNOSIS — M5116 Intervertebral disc disorders with radiculopathy, lumbar region: Secondary | ICD-10-CM | POA: Diagnosis not present

## 2018-01-12 DIAGNOSIS — M9903 Segmental and somatic dysfunction of lumbar region: Secondary | ICD-10-CM | POA: Diagnosis not present

## 2018-01-12 DIAGNOSIS — M5116 Intervertebral disc disorders with radiculopathy, lumbar region: Secondary | ICD-10-CM | POA: Diagnosis not present

## 2018-01-14 DIAGNOSIS — M5116 Intervertebral disc disorders with radiculopathy, lumbar region: Secondary | ICD-10-CM | POA: Diagnosis not present

## 2018-01-14 DIAGNOSIS — M9903 Segmental and somatic dysfunction of lumbar region: Secondary | ICD-10-CM | POA: Diagnosis not present

## 2018-01-26 DIAGNOSIS — M9903 Segmental and somatic dysfunction of lumbar region: Secondary | ICD-10-CM | POA: Diagnosis not present

## 2018-01-26 DIAGNOSIS — M5116 Intervertebral disc disorders with radiculopathy, lumbar region: Secondary | ICD-10-CM | POA: Diagnosis not present

## 2018-02-02 DIAGNOSIS — M9903 Segmental and somatic dysfunction of lumbar region: Secondary | ICD-10-CM | POA: Diagnosis not present

## 2018-02-02 DIAGNOSIS — M5116 Intervertebral disc disorders with radiculopathy, lumbar region: Secondary | ICD-10-CM | POA: Diagnosis not present

## 2018-02-13 ENCOUNTER — Other Ambulatory Visit: Payer: Self-pay | Admitting: Family Medicine

## 2018-02-13 ENCOUNTER — Telehealth: Payer: Self-pay | Admitting: *Deleted

## 2018-02-13 MED ORDER — DOXYCYCLINE HYCLATE 100 MG PO TABS: 100.0000 mg | ORAL_TABLET | Freq: Two times a day (BID) | ORAL | 0 refills | Status: DC

## 2018-02-13 NOTE — Telephone Encounter (Signed)
Per [facility] - ok to send in Doxy

## 2018-02-18 ENCOUNTER — Telehealth: Payer: Self-pay | Admitting: Family Medicine

## 2018-02-18 NOTE — Telephone Encounter (Signed)
Advised can eat with rx.

## 2018-02-18 NOTE — Telephone Encounter (Signed)
PT is wanting to speak to [facility], the pt recently started taking doxycycline (VIBRA-TABS) 100 MG tablet for tick bites and she is feeling nauseas and dizzy when taking it and wants to know if she can start eating when she takes it

## 2018-02-23 DIAGNOSIS — M5116 Intervertebral disc disorders with radiculopathy, lumbar region: Secondary | ICD-10-CM | POA: Diagnosis not present

## 2018-02-23 DIAGNOSIS — M9903 Segmental and somatic dysfunction of lumbar region: Secondary | ICD-10-CM | POA: Diagnosis not present

## 2018-03-03 DIAGNOSIS — Z85828 Personal history of other malignant neoplasm of skin: Secondary | ICD-10-CM | POA: Diagnosis not present

## 2018-03-03 DIAGNOSIS — L728 Other follicular cysts of the skin and subcutaneous tissue: Secondary | ICD-10-CM | POA: Diagnosis not present

## 2018-03-06 DIAGNOSIS — J019 Acute sinusitis, unspecified: Secondary | ICD-10-CM | POA: Diagnosis not present

## 2018-03-11 ENCOUNTER — Ambulatory Visit (INDEPENDENT_AMBULATORY_CARE_PROVIDER_SITE_OTHER): Payer: Medicare Other | Admitting: Family Medicine

## 2018-03-11 ENCOUNTER — Encounter: Payer: Self-pay | Admitting: Family Medicine

## 2018-03-11 VITALS — BP 131/85 | HR 68 | Temp 97.9°F | Ht 68.0 in | Wt 188.0 lb

## 2018-03-11 DIAGNOSIS — E559 Vitamin D deficiency, unspecified: Secondary | ICD-10-CM | POA: Diagnosis not present

## 2018-03-11 DIAGNOSIS — E7800 Pure hypercholesterolemia, unspecified: Secondary | ICD-10-CM

## 2018-03-11 DIAGNOSIS — R413 Other amnesia: Secondary | ICD-10-CM | POA: Diagnosis not present

## 2018-03-11 DIAGNOSIS — R42 Dizziness and giddiness: Secondary | ICD-10-CM | POA: Diagnosis not present

## 2018-03-11 NOTE — Patient Instructions (Addendum)
Medicare Annual Wellness Visit  [facility] and the medical providers at [facility] strive to bring you the best medical care.  In doing so we not only want to address your current medical conditions and concerns but also to detect new conditions early and prevent illness, disease and health-related problems.    Medicare offers a yearly Wellness Visit which allows our clinical staff to assess your need for preventative services including immunizations, lifestyle education, counseling to decrease risk of preventable diseases and screening for fall risk and other medical concerns.    This visit is provided free of charge (no copay) for all Medicare recipients. The clinical pharmacists at [facility] have begun to conduct these Wellness Visits which will also include a thorough review of all your medications.    As you primary medical provider recommend that you make an appointment for your Annual Wellness Visit if you have not done so already this year.  You may set up this appointment before you leave today or you may call back ([phone]) and schedule an appointment.  Please make sure when you call that you mention that you are scheduling your Annual Wellness Visit with the clinical pharmacist so that the appointment may be made for the proper length of time.     Continue current medications. Continue good therapeutic lifestyle changes which include good diet and exercise. Fall precautions discussed with patient. If an FOBT was given today- please return it to our front desk. If you are over 29 years old - you may need Prevnar 62 or the adult Pneumonia vaccine.  **Flu shots are available--- please call and schedule a FLU-CLINIC appointment**  After your visit with [place] today you will receive a survey in the mail or online from [employer] regarding your care with [place]. Please take a moment to fill this out. Your feedback is very  important to [place] as you can help [place] better understand your patient needs as well as improve your experience and satisfaction. WE CARE ABOUT YOU!!!   Continue to stay active physically Check regularity of pulse when you have a dizzy spell We will arrange for you to have a visit to see the neurologist to further evaluate your worsening dizziness with position changes and your memory changes that you are concerned about. Drink plenty of water and stay well-hydrated Do not put yourself at risk for falling and avoid climbing Continue with activity levels as doing

## 2018-03-11 NOTE — Progress Notes (Signed)
Subjective:    Patient ID: Kelly George, female    DOB: 1946-02-26, 72 y.o.   MRN: [id number]  HPI  Pt here for follow up and management of chronic medical problems which includes hyperlipidemia. She is taking medication regularly.  The patient today comes in for her regular visit and is complaining with soreness in the right hand and some dizziness worried about decreased memory and some back pain.  She gets her Pap smear with 1 of the mid levels in the practice and her mammogram and it is due.  She is also due to get a DEXA scan.  She will get lab work today.  Her vital signs are stable and her weight is down 4 pounds.  The patient's mother had breast cancer and COPD and her father had brain cancer with diabetes and heart disease.  She has no siblings.  Patient is pleasant and doing well and she comes to the visit today with her daughter Janett Billow.  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  They seem to be getting worse.  She also is concerned about decreasing memory issues and names of people in following directions.  She has had one MMSE and we will give her another one today.  She plans to schedule herself for mammogram and to get a pelvic exam and bone density.  She denies any chest pain or shortness of breath anymore than she would expect.  She has minimal heartburn and indigestion and especially related to acid type foods and her daughter is with her and if this progresses and gets worse she will call us back and we will arrange for her to see the gastroenterologist for possible endoscopy.  She has not seen any blood in the stool had any black tarry bowel movements and no significant change in bowel habits.  Her last colonoscopy was in 2013.  Because of the worsening dizziness we will arrange for her to see the neurologist and have him to  evaluate this along with her memory issues.    Patient Active Problem List   Diagnosis Date Noted  . Recurrent major depressive disorder, in full remission ([place]) 02/19/2017  . Precordial chest pain 07/07/2015  . Pain in joint of right foot 05/16/2014  . Vitamin D deficiency 07/08/2013  . Mitral valve prolapse syndrome, history of mild 01/05/2013  . Seasonal allergies   . Anxiety   . Depression   . Hyperlipidemia    Outpatient Encounter Medications as of 03/11/2018  Medication Sig  . calcium-vitamin D (OSCAL WITH D) 500-200 MG-UNIT per tablet Take 1 tablet by mouth daily.  . Cholecalciferol (VITAMIN D3) 2000 UNITS TABS Take 5,000 Units by mouth daily.   . fish oil-omega-3 fatty acids 1000 MG capsule Take 1,200 mg by mouth daily.   . fluticasone (FLONASE) 50 MCG/ACT nasal spray SPRAY 2 SPRAYS INTO EACH NOSTRIL EVERY DAY  . glucosamine-chondroitin 500-400 MG tablet Take 1 tablet by mouth 2 (two) times daily.   . naproxen sodium (ANAPROX) 220 MG tablet Take 220 mg by mouth daily. TAKE TWO TABLETS ONCE DAILY  . simvastatin (ZOCOR) 10 MG tablet TAKE 1 TABLET BY MOUTH EVERYDAY AT BEDTIME  . [DISCONTINUED] sertraline (ZOLOFT) 50 MG tablet Take 1 tablet (50 mg total) by mouth daily.  . [DISCONTINUED] ALPRAZolam (XANAX) 0.25 MG tablet Take 1  tablet (0.25 mg total) by mouth at bedtime as needed for sleep. (Patient not taking: Reported on 11/07/2017)  . [DISCONTINUED] cetirizine (ZYRTEC) 10 MG tablet Take 10 mg by mouth daily. Reported on 01/05/2016  . [DISCONTINUED] doxycycline (VIBRA-TABS) 100 MG tablet Take 1 tablet (100 mg total) by mouth 2 (two) times daily. 1 po bid  . [DISCONTINUED] meloxicam (MOBIC) 15 MG tablet 15 mg daily as needed for pain.  . [DISCONTINUED] mirabegron ER (MYRBETRIQ) 25 MG TB24 tablet Take 1 tablet (25 mg total) by mouth daily.  . [DISCONTINUED] oxybutynin (DITROPAN-XL) 5 MG 24 hr tablet Take 1 tablet (5 mg total) by mouth at bedtime.  . [DISCONTINUED] simvastatin (ZOCOR)  10 MG tablet Take 10 mg by mouth daily.   No facility-administered encounter medications on file as of 03/11/2018.      Review of Systems  Constitutional: Negative.   HENT: Negative.   Eyes: Negative.   Respiratory: Negative.   Cardiovascular: Negative.   Gastrointestinal: Negative.   Endocrine: Negative.   Genitourinary: Negative.   Musculoskeletal: Positive for back pain.       Right hand soreness  Skin: Negative.   Allergic/Immunologic: Negative.   Neurological: Positive for dizziness.  Hematological: Negative.   Psychiatric/Behavioral: Negative.        Memory changes        Objective:   Physical Exam  Constitutional: She is oriented to person, place, and time. She appears well-developed and well-nourished. No distress.  The patient is pleasant and alert  HENT:  Head: Normocephalic and atraumatic.  Right Ear: External ear normal.  Left Ear: External ear normal.  Nose: Nose normal.  Mouth/Throat: Oropharynx is clear and moist. No oropharyngeal exudate.  Eyes: Pupils are equal, round, and reactive to light. Conjunctivae and EOM are normal. Right eye exhibits no discharge. Left eye exhibits no discharge. No scleral icterus.  Neck: Normal range of motion. Neck supple. No thyromegaly present.  No thyromegaly anterior cervical adenopathy or bruits  Cardiovascular: Normal rate, regular rhythm, normal heart sounds and intact distal pulses.  No murmur heard. Heart is regular without murmur at 60/min.  Good pedal pulses.  Pulmonary/Chest: Effort normal and breath sounds normal. She has no wheezes. She has no rales.  Clear anteriorly and posteriorly  Abdominal: Soft. Bowel sounds are normal. She exhibits no mass. There is no tenderness.  No liver or spleen enlargement.  No epigastric tenderness.  No bruits and no masses.  Musculoskeletal: Normal range of motion. She exhibits no edema.  Lymphadenopathy:    She has no cervical adenopathy.  Neurological: She is alert and oriented  to person, place, and time. She has normal reflexes. She displays normal reflexes. No cranial nerve deficit or sensory deficit. She exhibits normal muscle tone. Coordination normal.  MMSE to be administered by the nurse  Skin: Skin is warm and dry. No rash noted.  Psychiatric: She has a normal mood and affect. Her behavior is normal. Judgment and thought content normal.  The patient's affect and mood appear normal.  Her judgment and thought content seem to be normal.  Nursing note and vitals reviewed.   BP 131/85 (BP Location: Left Arm)   Pulse 68   Temp 97.9 F (36.6 C) (Oral)   Ht 5' 8"  (1.727 m)   Wt 188 lb (85.3 kg)   BMI 28.59 kg/m         Assessment & Plan:  1. Vitamin D deficiency -Continue with current treatment pending results of lab work - CBC with  Differential/Platelet - VITAMIN D 25 Hydroxy (Vit-D Deficiency, Fractures)  2. Pure hypercholesterolemia -Continue with current treatment and aggressive therapeutic lifestyle changes - BMP8+EGFR - CBC with Differential/Platelet - Lipid panel - Thyroid Panel With TSH - Hepatic function panel  3. Dizziness -Appointment with neurologist - BMP8+EGFR - CBC with Differential/Platelet - Vitamin B12 - Thyroid Panel With TSH  4. Memory changes -Appointment with neurologist for further evaluation of dizziness and memory changes -MMSE to be administered today and compared to previous MMSE. - Vitamin B12 - Thyroid Panel With TSH  Patient Instructions                       Medicare Annual Wellness Visit  [facility] and the medical providers at [facility] strive to bring you the best medical care.  In doing so we not only want to address your current medical conditions and concerns but also to detect new conditions early and prevent illness, disease and health-related problems.    Medicare offers a yearly Wellness Visit which allows our clinical staff to assess your need for preventative services  including immunizations, lifestyle education, counseling to decrease risk of preventable diseases and screening for fall risk and other medical concerns.    This visit is provided free of charge (no copay) for all Medicare recipients. The clinical pharmacists at [facility] have begun to conduct these Wellness Visits which will also include a thorough review of all your medications.    As you primary medical provider recommend that you make an appointment for your Annual Wellness Visit if you have not done so already this year.  You may set up this appointment before you leave today or you may call back [phone]) and schedule an appointment.  Please make sure when you call that you mention that you are scheduling your Annual Wellness Visit with the clinical pharmacist so that the appointment may be made for the proper length of time.     Continue current medications. Continue good therapeutic lifestyle changes which include good diet and exercise. Fall precautions discussed with patient. If an FOBT was given today- please return it to our front desk. If you are over 70 years old - you may need Prevnar 27 or the adult Pneumonia vaccine.  **Flu shots are available--- please call and schedule a FLU-CLINIC appointment**  After your visit with [place] today you will receive a survey in the mail or online from [employer] regarding your care with [place]. Please take a moment to fill this out. Your feedback is very important to [place] as you can help [place] better understand your patient needs as well as improve your experience and satisfaction. WE CARE ABOUT YOU!!!   Continue to stay active physically Check regularity of pulse when you have a dizzy spell We will arrange for you to have a visit to see the neurologist to further evaluate your worsening dizziness with position changes and your memory changes that you are concerned about. Drink plenty of water and stay well-hydrated Do not put  yourself at risk for falling and avoid climbing Continue with activity levels as doing    Arrie Senate MD

## 2018-03-12 ENCOUNTER — Other Ambulatory Visit: Payer: Self-pay | Admitting: *Deleted

## 2018-03-12 LAB — BMP8+EGFR
BUN/Creatinine Ratio: 25 (ref 12–28)
BUN: 17 mg/dL (ref 8–27)
CO2: 24 mmol/L (ref 20–29)
Calcium: 10 mg/dL (ref 8.7–10.3)
Chloride: 102 mmol/L (ref 96–106)
Creatinine, Ser: 0.67 mg/dL (ref 0.57–1.00)
GFR calc Af Amer: 101 mL/min/{1.73_m2} (ref >59)
GFR calc non Af Amer: 87 mL/min/{1.73_m2} (ref >59)
Glucose: 98 mg/dL (ref 65–99)
Potassium: 4 mmol/L (ref 3.5–5.2)
Sodium: 141 mmol/L (ref 134–144)

## 2018-03-12 LAB — CBC WITH DIFFERENTIAL/PLATELET
Basophils Absolute: 0 10*3/uL (ref 0.0–0.2)
Basos: 1 %
EOS (ABSOLUTE): 0.2 10*3/uL (ref 0.0–0.4)
Eos: 4 %
Hematocrit: 39.9 % (ref 34.0–46.6)
Hemoglobin: 13.5 g/dL (ref 11.1–15.9)
Immature Grans (Abs): 0 10*3/uL (ref 0.0–0.1)
Immature Granulocytes: 0 %
Lymphocytes Absolute: 1.2 10*3/uL (ref 0.7–3.1)
Lymphs: 33 %
MCH: 32.1 pg (ref 26.6–33.0)
MCHC: 33.8 g/dL (ref 31.5–35.7)
MCV: 95 fL (ref 79–97)
Monocytes Absolute: 0.5 10*3/uL (ref 0.1–0.9)
Monocytes: 14 %
Neutrophils Absolute: 1.8 10*3/uL (ref 1.4–7.0)
Neutrophils: 48 %
Platelets: 267 10*3/uL (ref 150–450)
RBC: 4.21 x10E6/uL (ref 3.77–5.28)
RDW: 13.2 % (ref 12.3–15.4)
WBC: 3.8 10*3/uL (ref 3.4–10.8)

## 2018-03-12 LAB — HEPATIC FUNCTION PANEL
ALT: 24 IU/L (ref 0–32)
AST: 20 IU/L (ref 0–40)
Albumin: 4.5 g/dL (ref 3.5–4.8)
Alkaline Phosphatase: 73 IU/L (ref 39–117)
Bilirubin Total: 0.7 mg/dL (ref 0.0–1.2)
Bilirubin, Direct: 0.2 mg/dL (ref 0.00–0.40)
Total Protein: 6.8 g/dL (ref 6.0–8.5)

## 2018-03-12 LAB — THYROID PANEL WITH TSH
Free Thyroxine Index: 1.8 (ref 1.2–4.9)
T3 Uptake Ratio: 24 % (ref 24–39)
T4, Total: 7.3 ug/dL (ref 4.5–12.0)
TSH: 2.06 u[IU]/mL (ref 0.450–4.500)

## 2018-03-12 LAB — LIPID PANEL
Chol/HDL Ratio: 2.6 ratio (ref 0.0–4.4)
Cholesterol, Total: 157 mg/dL (ref 100–199)
HDL: 61 mg/dL (ref >39)
LDL Calculated: 84 mg/dL (ref 0–99)
Triglycerides: 58 mg/dL (ref 0–149)
VLDL Cholesterol Cal: 12 mg/dL (ref 5–40)

## 2018-03-12 LAB — VITAMIN D 25 HYDROXY (VIT D DEFICIENCY, FRACTURES): Vit D, 25-Hydroxy: 53.2 ng/mL (ref 30.0–100.0)

## 2018-03-12 LAB — VITAMIN B12: Vitamin B-12: 651 pg/mL (ref 232–1245)

## 2018-03-12 MED ORDER — ICOSAPENT ETHYL 1 G PO CAPS: 2.0000 | ORAL_CAPSULE | Freq: Two times a day (BID) | ORAL | 3 refills | Status: DC

## 2018-03-12 NOTE — Progress Notes (Signed)
vas 

## 2018-03-18 DIAGNOSIS — M5116 Intervertebral disc disorders with radiculopathy, lumbar region: Secondary | ICD-10-CM | POA: Diagnosis not present

## 2018-03-18 DIAGNOSIS — M9903 Segmental and somatic dysfunction of lumbar region: Secondary | ICD-10-CM | POA: Diagnosis not present

## 2018-04-09 ENCOUNTER — Encounter: Payer: Self-pay | Admitting: Neurology

## 2018-04-09 ENCOUNTER — Telehealth: Payer: Self-pay | Admitting: Family Medicine

## 2018-04-09 DIAGNOSIS — M5116 Intervertebral disc disorders with radiculopathy, lumbar region: Secondary | ICD-10-CM | POA: Diagnosis not present

## 2018-04-09 DIAGNOSIS — R42 Dizziness and giddiness: Secondary | ICD-10-CM

## 2018-04-09 DIAGNOSIS — M9903 Segmental and somatic dysfunction of lumbar region: Secondary | ICD-10-CM | POA: Diagnosis not present

## 2018-04-09 NOTE — Telephone Encounter (Signed)
Pt states we have been trying to get appt with [facility] but pt would refer to see Dr. Tomi Likens with [facility] for dizziness/imbalance. Referral placed as requested.

## 2018-04-29 DIAGNOSIS — M5116 Intervertebral disc disorders with radiculopathy, lumbar region: Secondary | ICD-10-CM | POA: Diagnosis not present

## 2018-04-29 DIAGNOSIS — M9903 Segmental and somatic dysfunction of lumbar region: Secondary | ICD-10-CM | POA: Diagnosis not present

## 2018-05-05 DIAGNOSIS — M9903 Segmental and somatic dysfunction of lumbar region: Secondary | ICD-10-CM | POA: Diagnosis not present

## 2018-05-05 DIAGNOSIS — M5116 Intervertebral disc disorders with radiculopathy, lumbar region: Secondary | ICD-10-CM | POA: Diagnosis not present

## 2018-05-08 ENCOUNTER — Other Ambulatory Visit: Payer: Self-pay | Admitting: Family Medicine

## 2018-05-08 ENCOUNTER — Telehealth: Payer: Self-pay | Admitting: Family Medicine

## 2018-05-08 DIAGNOSIS — Z1231 Encounter for screening mammogram for malignant neoplasm of breast: Secondary | ICD-10-CM

## 2018-05-08 DIAGNOSIS — M8580 Other specified disorders of bone density and structure, unspecified site: Secondary | ICD-10-CM

## 2018-05-08 DIAGNOSIS — Z780 Asymptomatic menopausal state: Secondary | ICD-10-CM

## 2018-05-08 NOTE — Telephone Encounter (Signed)
Wants referral for mammo at [facility] imaging. Please advise

## 2018-05-08 NOTE — Telephone Encounter (Signed)
Pt  Meant to request order for DEXA; Order placed and pt will call to schedule at the [place] [facility] [place]

## 2018-05-12 ENCOUNTER — Encounter: Payer: Self-pay | Admitting: Neurology

## 2018-05-13 ENCOUNTER — Ambulatory Visit: Payer: Medicare Other | Admitting: Family

## 2018-05-15 ENCOUNTER — Other Ambulatory Visit: Payer: Medicare Other | Admitting: Family

## 2018-05-19 ENCOUNTER — Other Ambulatory Visit: Payer: Self-pay | Admitting: Family Medicine

## 2018-05-19 DIAGNOSIS — M5116 Intervertebral disc disorders with radiculopathy, lumbar region: Secondary | ICD-10-CM | POA: Diagnosis not present

## 2018-05-19 DIAGNOSIS — M9903 Segmental and somatic dysfunction of lumbar region: Secondary | ICD-10-CM | POA: Diagnosis not present

## 2018-05-20 ENCOUNTER — Ambulatory Visit (INDEPENDENT_AMBULATORY_CARE_PROVIDER_SITE_OTHER): Payer: Medicare Other | Admitting: Neurology

## 2018-05-20 ENCOUNTER — Encounter: Payer: Self-pay | Admitting: Neurology

## 2018-05-20 VITALS — BP 150/88

## 2018-05-20 DIAGNOSIS — R42 Dizziness and giddiness: Secondary | ICD-10-CM

## 2018-05-20 DIAGNOSIS — R413 Other amnesia: Secondary | ICD-10-CM | POA: Diagnosis not present

## 2018-05-20 DIAGNOSIS — H814 Vertigo of central origin: Secondary | ICD-10-CM

## 2018-05-20 DIAGNOSIS — H8149 Vertigo of central origin, unspecified ear: Secondary | ICD-10-CM | POA: Diagnosis not present

## 2018-05-20 MED ORDER — SERTRALINE HCL 25 MG PO TABS: 25.0000 mg | ORAL_TABLET | Freq: Every day | ORAL | 0 refills | Status: DC

## 2018-05-20 NOTE — Progress Notes (Signed)
NEUROLOGY CONSULTATION NOTE  Kelly George MRN: [id number] DOB: 19-Oct-1945  Referring provider: Redge Gainer, MD Primary care provider: Redge Gainer, MD  Reason for consult:  Dizziness, memory deficits  HISTORY OF PRESENT ILLNESS: Kelly George is a 72 year old right-handed female with hyperlipidemia, depression, anxiety, who presents for dizziness and memory changes.  She is accompanied by her daughter who supplements history.  DIZZINESS: She has had dizzy spells since her 87s or 30s.  They are triggered by looking at fast movement (such as watching a movie), looking up or bending over.  She describes it as a woozy sensation but it is not spinning or near syncope.  There is associated nausea.  There is no associated headache, visual disturbance (double vision, aura, vision loss), photophobia, phonophobia or unilateral numbness or weakness.  It typically lasts at least 2 to 3 hours.  It is aggravated by movement and relieved by staying still.  She reports tinnitus and trouble hearing.  She feels unsteady but has not fallen.  They would initially occur once in awhile.  She reports increased spells over the past 3 months.  Since that time, she has started seeing a Physiological scientist and the gym.  Sometimes, exercising triggers a dizzy spell.  For many years, she has been on sertraline 50mg .  Over the past several weeks, she has slowly tapered herself off.  She reports no history of migraines or other headaches.  However, her daughter has migraines and her mother had migraines.  MEMORY CHANGES: She began noticing memory problems for the past 5 years.  It is short term memory such as recalling recent conversations or misplacing items at home.  Infrequently, she may feel a little disoriented even when driving on familiar routes but hasn't gotten lost.  She lives by herself and manages her bills and medication without difficulty, although most of her bills are on autopay.  There is no known family  history of dementia..  03/11/18 LABS:  B12 651; TSH 2.060, total T4 7.3; D 25-hydroxy 53.2  PAST MEDICAL HISTORY: Past Medical History:  Diagnosis Date  . Anxiety   . Depression   . Hyperlipidemia   . Irregular heart beat   . Mitral valve prolapse   . Seasonal allergies     PAST SURGICAL HISTORY: Past Surgical History:  Procedure Laterality Date  . BREAST LUMPECTOMY     benign  . COLONOSCOPY  07/07/01  . SKIN SURGERY     DERM - nasal   . TUBAL LIGATION      MEDICATIONS: Current Outpatient Medications on File Prior to Visit  Medication Sig Dispense Refill  . calcium-vitamin D (OSCAL WITH D) 500-200 MG-UNIT per tablet Take 1 tablet by mouth daily.    . Cholecalciferol (VITAMIN D3) 2000 UNITS TABS Take 5,000 Units by mouth daily.     . fish oil-omega-3 fatty acids 1000 MG capsule Take 1,200 mg by mouth daily.     . fluticasone (FLONASE) 50 MCG/ACT nasal spray SPRAY 2 SPRAYS INTO EACH NOSTRIL EVERY DAY 48 g 1  . glucosamine-chondroitin 500-400 MG tablet Take 1 tablet by mouth 2 (two) times daily.     Vanessa Kick Ethyl (VASCEPA) 1 g CAPS Take 2 capsules (2 g total) by mouth 2 (two) times daily. (Patient not taking: Reported on 05/20/2018) 120 capsule 3  . naproxen sodium (ANAPROX) 220 MG tablet Take 220 mg by mouth daily. TAKE TWO TABLETS ONCE DAILY    . simvastatin (ZOCOR) 10 MG tablet TAKE  1 TABLET BY MOUTH EVERYDAY AT BEDTIME 90 tablet 0   No current facility-administered medications on file prior to visit.     ALLERGIES: Allergies  Allergen Reactions  . Penicillins Rash  . Tetracyclines & Related Palpitations    FAMILY HISTORY: Family History  Problem Relation Age of Onset  . Breast cancer Mother   . COPD Mother   . Diabetes Father   . Cancer Father        Brain  . Heart attack Father 51  . Uterine cancer Maternal Aunt   . Heart disease Brother   . High Cholesterol Brother     SOCIAL HISTORY: Social History   Socioeconomic History  . Marital status:  Divorced    Spouse name: Not on file  . Number of children: 2  . Years of education: Not on file  . Highest education level: Associate degree: academic program  Occupational History  . Occupation: Retired  Scientific laboratory technician  . Financial resource strain: Not on file  . Food insecurity:    Worry: Not on file    Inability: Not on file  . Transportation needs:    Medical: Not on file    Non-medical: Not on file  Tobacco Use  . Smoking status: Never Smoker  . Smokeless tobacco: Never Used  Substance and Sexual Activity  . Alcohol use: Yes    Alcohol/week: 4.8 oz    Types: 8 Glasses of wine per week  . Drug use: No  . Sexual activity: Not on file  Lifestyle  . Physical activity:    Days per week: Not on file    Minutes per session: Not on file  . Stress: Not on file  Relationships  . Social connections:    Talks on phone: Not on file    Gets together: Not on file    Attends religious service: Not on file    Active member of club or organization: Not on file    Attends meetings of clubs or organizations: Not on file    Relationship status: Not on file  . Intimate partner violence:    Fear of current or ex partner: Not on file    Emotionally abused: Not on file    Physically abused: Not on file    Forced sexual activity: Not on file  Other Topics Concern  . Not on file  Social History Narrative   Live alone.     Patient is right-handed. She lives alone in a 1 story house, 3-4 steps to enter.     REVIEW OF SYSTEMS: Constitutional: No fevers, chills, or sweats, no generalized fatigue, change in appetite Eyes: No visual changes, double vision, eye pain Ear, nose and throat: No hearing loss, ear pain, nasal congestion, sore throat Cardiovascular: No chest pain, palpitations Respiratory:  No shortness of breath at rest or with exertion, wheezes GastrointestinaI: No nausea, vomiting, diarrhea, abdominal pain, fecal incontinence Genitourinary:  No dysuria, urinary retention or  frequency Musculoskeletal:  No neck pain, back pain Integumentary: No rash, pruritus, skin lesions Neurological: as above Psychiatric: No depression, insomnia, anxiety Endocrine: No palpitations, fatigue, diaphoresis, mood swings, change in appetite, change in weight, increased thirst Hematologic/Lymphatic:  No purpura, petechiae. Allergic/Immunologic: no itchy/runny eyes, nasal congestion, recent allergic reactions, rashes  PHYSICAL EXAM: Vitals:   05/20/18 1500  BP: (!) 150/88   General: No acute distress.  Patient appears well-groomed.  Head:  Normocephalic/atraumatic Eyes:  fundi examined but not visualized Neck: supple, no paraspinal tenderness, full range of  motion Back: No paraspinal tenderness Heart: regular rate and rhythm Lungs: Clear to auscultation bilaterally. Vascular: No carotid bruits. Neurological Exam: Mental status: alert and oriented to person, place, and time, delayed recall 2 out of 3 words; remote memory intact, fund of knowledge intact, attention and concentration intact, speech fluent and not dysarthric, language intact.  Incorrectly copied intersecting pentagons. MMSE - Mini Mental State Exam 05/20/2018 03/11/2018 06/25/2017  Orientation to time 5 5 5   Orientation to Place 5 5 5   Registration 3 3 3   Attention/ Calculation 5 5 5   Recall 2 3 1   Language- name 2 objects 2 2 2   Language- repeat 1 1 1   Language- follow 3 step command 3 3 3   Language- read & follow direction 1 1 1   Write a sentence 1 1 1   Copy design 0 1 1  Total score 28 30 28    Cranial nerves: CN I: not tested CN II: pupils equal, round and reactive to light, visual fields intact CN III, IV, VI:  full range of motion, no nystagmus, no ptosis CN V: facial sensation intact CN VII: upper and lower face symmetric CN VIII: hearing intact CN IX, X: gag intact, uvula midline CN XI: sternocleidomastoid and trapezius muscles intact CN XII: tongue midline Bulk & Tone: normal, no  fasciculations. Motor:  5/5 throughout  Sensation: temperature and vibration sensation intact. Deep Tendon Reflexes:  2+ throughout, toes downgoing.  Finger to nose testing:  Without dysmetria.  Heel to shin:  Without dysmetria.  Gait:  Normal station and stride.  Able to turn and tandem walk. Romberg negative.  IMPRESSION: 1.  Episodic dizziness.  Possibly migraine-related although she does not have history of migraine headaches.  Migraines run in her family.  They are not consistent with BPPV.  Sometimes SSRIs or other antidepressants may be helpful in treating dizziness.  Perhaps increased dizziness was triggered by tapering off of sertraline. 2.  Memory deficits.  No significant cognitive impairment appreciated.  PLAN: 1.  We will restart sertraline and see if dizzy spells improve again, 25mg  daily for 4 weeks, then 50mg  daily. 2.  MRI of brain to further evaluate causes for memory deficits 3.  Neurocognitive testing 4.  Follow up in 3 to 4 months.  Thank you for allowing me to take part in the care of this patient.  Metta Clines, DO  CC:  Redge Gainer, MD

## 2018-05-20 NOTE — Patient Instructions (Addendum)
1.  We will restart sertraline 25mg  daily.  In 4 weeks, contact us and we will increase dose to 50mg  daily 2.  We will check MRI of brain 3.  Start Mediterranean diet (see below) 4.  We will order neurocognitive testing 5.  Follow up in 3 to 4 months.  We have sent a referral to [facility] for your MRI and they will call you directly to schedule your appt. They are located at [place]. If you need to contact them directly please call [phone].    Mediterranean Diet A Mediterranean diet refers to food and lifestyle choices that are based on the traditions of countries located on the [employer]. This way of eating has been shown to help prevent certain conditions and improve outcomes for people who have chronic diseases, like kidney disease and heart disease. What are tips for following this plan? Lifestyle  Cook and eat meals together with your family, when possible.  Drink enough fluid to keep your urine clear or pale yellow.  Be physically active every day. This includes: ? Aerobic exercise like running or swimming. ? Leisure activities like gardening, walking, or housework.  Get 7-8 hours of sleep each night.  If recommended by your health care provider, drink red wine in moderation. This means 1 glass a day for nonpregnant women and 2 glasses a day for men. A glass of wine equals 5 oz (150 mL). Reading food labels  Check the serving size of packaged foods. For foods such as rice and pasta, the serving size refers to the amount of cooked product, not dry.  Check the total fat in packaged foods. Avoid foods that have saturated fat or trans fats.  Check the ingredients list for added sugars, such as corn syrup. Shopping  At the grocery store, buy most of your food from the areas near the walls of the store. This includes: ? Fresh fruits and vegetables (produce). ? Grains, beans, nuts, and seeds. Some of these may be available in unpackaged forms or  large amounts (in bulk). ? Fresh seafood. ? Poultry and eggs. ? Low-fat dairy products.  Buy whole ingredients instead of prepackaged foods.  Buy fresh fruits and vegetables in-season from local farmers markets.  Buy frozen fruits and vegetables in resealable bags.  If you do not have access to quality fresh seafood, buy precooked frozen shrimp or canned fish, such as tuna, salmon, or sardines.  Buy small amounts of raw or cooked vegetables, salads, or olives from the deli or salad bar at your store.  Stock your pantry so you always have certain foods on hand, such as olive oil, canned tuna, canned tomatoes, rice, pasta, and beans. Cooking  Cook foods with extra-virgin olive oil instead of using butter or other vegetable oils.  Have meat as a side dish, and have vegetables or grains as your main dish. This means having meat in small portions or adding small amounts of meat to foods like pasta or stew.  Use beans or vegetables instead of meat in common dishes like chili or lasagna.  Experiment with different cooking methods. Try roasting or broiling vegetables instead of steaming or sauteing them.  Add frozen vegetables to soups, stews, pasta, or rice.  Add nuts or seeds for added healthy fat at each meal. You can add these to yogurt, salads, or vegetable dishes.  Marinate fish or vegetables using olive oil, lemon juice, garlic, and fresh herbs. Meal planning  Plan to eat 1 vegetarian  meal one day each week. Try to work up to 2 vegetarian meals, if possible.  Eat seafood 2 or more times a week.  Have healthy snacks readily available, such as: ? Vegetable sticks with hummus. ? [place] yogurt. ? Fruit and nut trail mix.  Eat balanced meals throughout the week. This includes: ? Fruit: 2-3 servings a day ? Vegetables: 4-5 servings a day ? Low-fat dairy: 2 servings a day ? Fish, poultry, or lean meat: 1 serving a day ? Beans and legumes: 2 or more servings a week ? Nuts and  seeds: 1-2 servings a day ? Whole grains: 6-8 servings a day ? Extra-virgin olive oil: 3-4 servings a day  Limit red meat and sweets to only a few servings a month What are my food choices?  Mediterranean diet ? Recommended ? Grains: Whole-grain pasta. Brown rice. Bulgar wheat. Polenta. Couscous. Whole-wheat bread. [place]. ? Vegetables: Artichokes. Beets. Broccoli. Cabbage. Carrots. Eggplant. Green beans. Chard. Kale. Spinach. Onions. Leeks. Peas. Squash. Tomatoes. Peppers. Radishes. ? Fruits: Apples. Apricots. Avocado. Berries. Bananas. Cherries. Dates. Figs. Grapes. Lemons. Melon. Oranges. Peaches. Plums. Pomegranate. ? Meats and other protein foods: Beans. Almonds. Sunflower seeds. Pine nuts. Peanuts. [place]. Salmon. Scall[place]. Shrimp. [place]. Tilapia. Clams. Oysters. Eggs. ? Dairy: Low-fat milk. Cheese. Greek yogurt. ? Beverages: Water. Red wine. Herbal tea. ? Fats and oils: Extra virgin olive oil. Avocado oil. Grape seed oil. ? Sweets and desserts: [place] yogurt with honey. Baked apples. Poached pears. Trail mix. ? Seasoning and other foods: Basil. Cilantro. Coriander. Cumin. Mint. Parsley. Sage. Rosemary. Tarragon. Garlic. Oregano. Thyme. Pepper. Balsalmic vinegar. Tahini. Hummus. Tomato sauce. Olives. Mushrooms. ? Limit these ? Grains: Prepackaged pasta or rice dishes. Prepackaged cereal with added sugar. ? Vegetables: Deep fried potatoes (french fries). ? Fruits: Fruit canned in syrup. ? Meats and other protein foods: Beef. Pork. Lamb. Poultry with skin. Hot dogs. [place] Salines. ? Dairy: Ice cream. Sour cream. Whole milk. ? Beverages: Juice. Sugar-sweetened soft drinks. Beer. Liquor and spirits. ? Fats and oils: Butter. Canola oil. Vegetable oil. Beef fat (tallow). Lard. ? Sweets and desserts: Cookies. Cakes. Pies. Candy. ? Seasoning and other foods: Mayonnaise. Premade sauces and marinades. ? The items listed may not be a complete list. Talk with your dietitian about what dietary  choices are right for you. Summary  The Mediterranean diet includes both food and lifestyle choices.  Eat a variety of fresh fruits and vegetables, beans, nuts, seeds, and whole grains.  Limit the amount of red meat and sweets that you eat.  Talk with your health care provider about whether it is safe for you to drink red wine in moderation. This means 1 glass a day for nonpregnant women and 2 glasses a day for men. A glass of wine equals 5 oz (150 mL). This information is not intended to replace advice given to you by your health care provider. Make sure you discuss any questions you have with your health care provider. Document Released: 05/23/2016 Document Revised: 06/25/2016 Document Reviewed: 05/23/2016 Elsevier Interactive Patient Education  [employer].

## 2018-05-21 ENCOUNTER — Telehealth: Payer: Self-pay

## 2018-05-21 ENCOUNTER — Ambulatory Visit: Payer: Medicare Other | Admitting: Neurology

## 2018-05-21 NOTE — Telephone Encounter (Signed)
Rcvd after hours message from [facility] from pharmacist Juliann Pulse. She needed clarification on sertraline Rx. Called and spoke with Juliann Pulse. The Pt was previously on 50mg . The new Rx is for 25mg . Juliann Pulse was able to reach the Pt and clarified that the Pt had d/c sertraline 50 mg, and we are now restarting her on 25mg  x 4wks, then will increase to 50mg .

## 2018-05-25 ENCOUNTER — Encounter: Payer: Self-pay | Admitting: Family

## 2018-05-25 ENCOUNTER — Ambulatory Visit (INDEPENDENT_AMBULATORY_CARE_PROVIDER_SITE_OTHER): Payer: Medicare Other | Admitting: Family

## 2018-05-25 VITALS — BP 125/79 | HR 68 | Temp 98.0°F | Ht 68.0 in | Wt 185.4 lb

## 2018-05-25 DIAGNOSIS — R87610 Atypical squamous cells of undetermined significance on cytologic smear of cervix (ASC-US): Secondary | ICD-10-CM | POA: Diagnosis not present

## 2018-05-25 DIAGNOSIS — Z01419 Encounter for gynecological examination (general) (routine) without abnormal findings: Secondary | ICD-10-CM

## 2018-05-25 NOTE — Patient Instructions (Signed)

## 2018-05-25 NOTE — Progress Notes (Signed)
Subjective:    Patient ID: Kelly George, female    DOB: 01-25-1946, 72 y.o.   MRN: [id number]  Chief Complaint  Patient presents with  . Gynecologic Exam    pap per Dr. Terrilee Files   Pt presents to the office today for pap. She is followed by her PCP every 4 months for chronic follow up.  Gynecologic Exam  The patient's pertinent negatives include no genital itching, genital lesions, genital odor, vaginal bleeding or vaginal discharge. The patient is experiencing no pain. Pertinent negatives include no chills, constipation, flank pain, headaches, hematuria or painful intercourse. The treatment provided no relief.      Review of Systems  Constitutional: Negative for chills.  Gastrointestinal: Negative for constipation.  Genitourinary: Negative for flank pain, hematuria and vaginal discharge.  Neurological: Negative for headaches.  All other systems reviewed and are negative.  Family History  Problem Relation Age of Onset  . Breast cancer Mother   . COPD Mother   . Diabetes Father   . Cancer Father        Brain  . Heart attack Father 9  . Uterine cancer Maternal Aunt   . Heart disease Brother   . High Cholesterol Brother     Social History   Socioeconomic History  . Marital status: Divorced    Spouse name: Not on file  . Number of children: 2  . Years of education: Not on file  . Highest education level: Associate degree: academic program  Occupational History  . Occupation: Retired  Scientific laboratory technician  . Financial resource strain: Not on file  . Food insecurity:    Worry: Not on file    Inability: Not on file  . Transportation needs:    Medical: Not on file    Non-medical: Not on file  Tobacco Use  . Smoking status: Never Smoker  . Smokeless tobacco: Never Used  Substance and Sexual Activity  . Alcohol use: Yes    Alcohol/week: 8.0 standard drinks    Types: 8 Glasses of wine per week  . Drug use: No  . Sexual activity: Not on file  Lifestyle  . Physical  activity:    Days per week: Not on file    Minutes per session: Not on file  . Stress: Not on file  Relationships  . Social connections:    Talks on phone: Not on file    Gets together: Not on file    Attends religious service: Not on file    Active member of club or organization: Not on file    Attends meetings of clubs or organizations: Not on file    Relationship status: Not on file  Other Topics Concern  . Not on file  Social History Narrative   Live alone.     Patient is right-handed. She lives alone in a 1 story house, 3-4 steps to enter.        Objective:   Physical Exam  Constitutional: She is oriented to person, place, and time. She appears well-developed and well-nourished. No distress.  HENT:  Head: Normocephalic and atraumatic.  Right Ear: External ear normal.  Left Ear: External ear normal.  Mouth/Throat: Oropharynx is clear and moist.  Eyes: Pupils are equal, round, and reactive to light.  Neck: Normal range of motion. Neck supple. No thyromegaly present.  Cardiovascular: Normal rate, regular rhythm, normal heart sounds and intact distal pulses.  No murmur heard. Pulmonary/Chest: Effort normal and breath sounds normal. No respiratory distress. She has  no wheezes. Right breast exhibits no inverted nipple, no mass, no nipple discharge, no skin change and no tenderness. Left breast exhibits no inverted nipple, no mass, no nipple discharge, no skin change and no tenderness.  Abdominal: Soft. Bowel sounds are normal. She exhibits no distension. There is no tenderness.  Genitourinary: Vagina normal.  Genitourinary Comments: Bimanual exam- no adnexal masses or tenderness, ovaries nonpalpable   Cervix parous and pink- No discharge   Musculoskeletal: Normal range of motion. She exhibits no edema or tenderness.  Neurological: She is alert and oriented to person, place, and time. She has normal reflexes. No cranial nerve deficit.  Skin: Skin is warm and dry.  Psychiatric:  She has a normal mood and affect. Her behavior is normal. Judgment and thought content normal.  Vitals reviewed.     BP 125/79   Pulse 68   Temp 98 F (36.7 C) (Oral)   Ht 5\' 8"  (1.727 m)   Wt 185 lb 6.4 oz (84.1 kg)   BMI 28.19 kg/m      Assessment & Plan:  Kelly George comes in today with chief complaint of Gynecologic Exam (pap per Dr. Terrilee Files)   Diagnosis and orders addressed:  1. Encounter for gynecological examination without abnormal finding - Pap IG (Image Guided)   Pap pending Health Maintenance reviewed- Pt to get mammogram results faxed to our office Diet and exercise encouraged  Follow up plan: Keep follow up with PCP   Evelina Dun, FNP

## 2018-05-28 LAB — PAP IG (IMAGE GUIDED): PAP Smear Comment: 0

## 2018-05-29 ENCOUNTER — Ambulatory Visit: Payer: Medicare Other

## 2018-05-29 LAB — SPECIMEN STATUS REPORT

## 2018-06-02 ENCOUNTER — Ambulatory Visit
Admission: RE | Admit: 2018-06-02 | Discharge: 2018-06-02 | Disposition: A | Discharge status: Home / Self Care | Payer: Medicare Other | Source: Ambulatory Visit | Attending: Neurology | Admitting: Neurology

## 2018-06-02 DIAGNOSIS — H8149 Vertigo of central origin, unspecified ear: Secondary | ICD-10-CM | POA: Diagnosis not present

## 2018-06-02 DIAGNOSIS — R413 Other amnesia: Secondary | ICD-10-CM

## 2018-06-02 DIAGNOSIS — R42 Dizziness and giddiness: Secondary | ICD-10-CM

## 2018-06-02 DIAGNOSIS — H814 Vertigo of central origin: Secondary | ICD-10-CM

## 2018-06-02 LAB — HPV DNA PROBE HIGH RISK, AMPLIFIED: HPV, high-risk: NEGATIVE

## 2018-06-02 LAB — SPECIMEN STATUS REPORT

## 2018-06-03 ENCOUNTER — Telehealth: Payer: Self-pay

## 2018-06-03 DIAGNOSIS — M9903 Segmental and somatic dysfunction of lumbar region: Secondary | ICD-10-CM | POA: Diagnosis not present

## 2018-06-03 DIAGNOSIS — M5116 Intervertebral disc disorders with radiculopathy, lumbar region: Secondary | ICD-10-CM | POA: Diagnosis not present

## 2018-06-03 NOTE — Telephone Encounter (Signed)
-----   Message from Pieter Partridge, DO sent at 06/02/2018  3:00 PM EDT ----- MRI of brain shows mild age-related changes but nothing significant.

## 2018-06-03 NOTE — Telephone Encounter (Signed)
Called and spoke with Pt, advised her of MRI results. 

## 2018-06-04 ENCOUNTER — Telehealth: Payer: Self-pay | Admitting: Family Medicine

## 2018-06-04 NOTE — Telephone Encounter (Signed)
Kelly George, please review HPV results

## 2018-06-04 NOTE — Telephone Encounter (Signed)
Kelly George did this. But result is normal

## 2018-06-05 NOTE — Telephone Encounter (Signed)
Patient is aware of results of her pap and HPV, she is wondering when it is recommended she repeat pap because of abnormal cells.  Please advise.

## 2018-06-05 NOTE — Telephone Encounter (Signed)
Recommendation for ASCUS with negative HPV is repeat in 1 year

## 2018-06-05 NOTE — Telephone Encounter (Signed)
Aware of results. 

## 2018-06-12 ENCOUNTER — Other Ambulatory Visit: Payer: Self-pay | Admitting: Neurology

## 2018-06-22 ENCOUNTER — Telehealth: Payer: Self-pay | Admitting: Neurology

## 2018-06-22 NOTE — Telephone Encounter (Signed)
Patient needs to talk to someone about her medication zoloft. She is not sure if that is the cause of her doing better please call

## 2018-06-23 DIAGNOSIS — M5116 Intervertebral disc disorders with radiculopathy, lumbar region: Secondary | ICD-10-CM | POA: Diagnosis not present

## 2018-06-23 DIAGNOSIS — M9903 Segmental and somatic dysfunction of lumbar region: Secondary | ICD-10-CM | POA: Diagnosis not present

## 2018-06-23 NOTE — Telephone Encounter (Signed)
Called Pt, LMOVM for return call 

## 2018-06-23 NOTE — Telephone Encounter (Signed)
Pt rtrnd call, she wanted to know if Zoloft could be responsible for feeling so much better. I advised her that was the result we hoped for, and to continue Zoloft.

## 2018-06-24 ENCOUNTER — Ambulatory Visit
Admission: RE | Admit: 2018-06-24 | Discharge: 2018-06-24 | Disposition: A | Discharge status: Home / Self Care | Payer: Medicare Other | Source: Ambulatory Visit | Attending: Family Medicine | Admitting: Family Medicine

## 2018-06-24 DIAGNOSIS — Z780 Asymptomatic menopausal state: Secondary | ICD-10-CM | POA: Diagnosis not present

## 2018-06-24 DIAGNOSIS — Z1231 Encounter for screening mammogram for malignant neoplasm of breast: Secondary | ICD-10-CM | POA: Diagnosis not present

## 2018-06-24 DIAGNOSIS — M8580 Other specified disorders of bone density and structure, unspecified site: Secondary | ICD-10-CM

## 2018-06-24 DIAGNOSIS — Z13820 Encounter for screening for osteoporosis: Secondary | ICD-10-CM | POA: Diagnosis not present

## 2018-07-10 DIAGNOSIS — M545 Low back pain: Secondary | ICD-10-CM | POA: Diagnosis not present

## 2018-07-15 DIAGNOSIS — M9903 Segmental and somatic dysfunction of lumbar region: Secondary | ICD-10-CM | POA: Diagnosis not present

## 2018-07-15 DIAGNOSIS — M5116 Intervertebral disc disorders with radiculopathy, lumbar region: Secondary | ICD-10-CM | POA: Diagnosis not present

## 2018-07-16 ENCOUNTER — Encounter: Payer: Self-pay | Admitting: Family Medicine

## 2018-07-16 ENCOUNTER — Ambulatory Visit (INDEPENDENT_AMBULATORY_CARE_PROVIDER_SITE_OTHER): Payer: Medicare Other | Admitting: Family Medicine

## 2018-07-16 VITALS — BP 129/82 | HR 64 | Temp 97.1°F | Ht 68.0 in | Wt 185.0 lb

## 2018-07-16 DIAGNOSIS — F339 Major depressive disorder, recurrent, unspecified: Secondary | ICD-10-CM | POA: Diagnosis not present

## 2018-07-16 DIAGNOSIS — F419 Anxiety disorder, unspecified: Secondary | ICD-10-CM

## 2018-07-16 DIAGNOSIS — E7800 Pure hypercholesterolemia, unspecified: Secondary | ICD-10-CM

## 2018-07-16 DIAGNOSIS — E559 Vitamin D deficiency, unspecified: Secondary | ICD-10-CM

## 2018-07-16 DIAGNOSIS — Z23 Encounter for immunization: Secondary | ICD-10-CM

## 2018-07-16 DIAGNOSIS — M47896 Other spondylosis, lumbar region: Secondary | ICD-10-CM | POA: Diagnosis not present

## 2018-07-16 DIAGNOSIS — R413 Other amnesia: Secondary | ICD-10-CM | POA: Diagnosis not present

## 2018-07-16 NOTE — Patient Instructions (Addendum)
Medicare Annual Wellness Visit  [facility] and the medical providers at [facility] strive to bring you the best medical care.  In doing so we not only want to address your current medical conditions and concerns but also to detect new conditions early and prevent illness, disease and health-related problems.    Medicare offers a yearly Wellness Visit which allows our clinical staff to assess your need for preventative services including immunizations, lifestyle education, counseling to decrease risk of preventable diseases and screening for fall risk and other medical concerns.    This visit is provided free of charge (no copay) for all Medicare recipients. The clinical pharmacists at [facility] have begun to conduct these Wellness Visits which will also include a thorough review of all your medications.    As you primary medical provider recommend that you make an appointment for your Annual Wellness Visit if you have not done so already this year.  You may set up this appointment before you leave today or you may call back ([phone]) and schedule an appointment.  Please make sure when you call that you mention that you are scheduling your Annual Wellness Visit with the clinical pharmacist so that the appointment may be made for the proper length of time.     Continue current medications. Continue good therapeutic lifestyle changes which include good diet and exercise. Fall precautions discussed with patient. If an FOBT was given today- please return it to our front desk. If you are over 55 years old - you may need Prevnar 5 or the adult Pneumonia vaccine.  **Flu shots are available--- please call and schedule a FLU-CLINIC appointment**  After your visit with [place] today you will receive a survey in the mail or online from [employer] regarding your care with [place]. Please take a moment to fill this out. Your feedback is very  important to [place] as you can help [place] better understand your patient needs as well as improve your experience and satisfaction. WE CARE ABOUT YOU!!!     Keep follow up with neurology and ortho. Continue with Zoloft Drink plenty of fluids and stay well-hydrated

## 2018-07-16 NOTE — Progress Notes (Signed)
Subjective:    Patient ID: Kelly George, female    DOB: Jul 01, 1946, 72 y.o.   MRN: [id number]  HPI Pt here for follow up and management of chronic medical problems which includes hyperlipidemia. She is taking medication regularly.  The patient is doing well overall but is complaining with some back pain.  She was seen by the PA and is going to see the orthopedist soon.  This patient has a history of depression hyperlipidemia vitamin D deficiency and anxiety.  She is currently taking simvastatin sertraline and vitamin D 3.  The patient has had some increased back pain and went to see an orthopedic group in [place] and they are planning to do further studies after taking a round of prednisone and giving her something to take for pain.  She is still having some pain in her left low back.  She is working on getting a return appointment with orthopedic surgeon that may do injections.  She denies any chest pain or shortness of breath.  She denies any trouble with swallowing heartburn indigestion nausea vomiting diarrhea blood in the stool or black tarry bowel movements.  She is passing her water without problems.  She did see the neurologist regarding her memory issues and he did do an MRI of her brain and says she had normal aging issues with her brain and he is planning to do some cognitive studies which have not been done yet he likes the use of Zoloft.  She will get her flu shot today.     Patient Active Problem List   Diagnosis Date Noted  . Recurrent major depressive disorder, in full remission ([place]) 02/19/2017  . Precordial chest pain 07/07/2015  . Pain in joint of right foot 05/16/2014  . Vitamin D deficiency 07/08/2013  . Mitral valve prolapse syndrome, history of mild 01/05/2013  . Seasonal allergies   . Anxiety   . Depression   . Hyperlipidemia    Outpatient Encounter Medications as of 07/16/2018  Medication Sig  . calcium-vitamin D (OSCAL WITH D) 500-200 MG-UNIT per tablet Take  1 tablet by mouth daily.  . Cholecalciferol (VITAMIN D3) 2000 UNITS TABS Take 5,000 Units by mouth daily.   . Cyanocobalamin (VITAMIN B 12 PO) Take by mouth. 500 mg OTC one a day  . fish oil-omega-3 fatty acids 1000 MG capsule Take 1,200 mg by mouth daily.   Marland Kitchen glucosamine-chondroitin 500-400 MG tablet Take 1 tablet by mouth 2 (two) times daily.   . naproxen sodium (ANAPROX) 220 MG tablet Take 220 mg by mouth daily. TAKE TWO TABLETS ONCE DAILY  . sertraline (ZOLOFT) 25 MG tablet TAKE 1 TABLET BY MOUTH EVERY DAY  . simvastatin (ZOCOR) 10 MG tablet TAKE 1 TABLET BY MOUTH EVERYDAY AT BEDTIME  . fluticasone (FLONASE) 50 MCG/ACT nasal spray SPRAY 2 SPRAYS INTO EACH NOSTRIL EVERY DAY (Patient not taking: Reported on 07/16/2018)  . traMADol (ULTRAM) 50 MG tablet TAKE 1 TABLET EVERY 4 TO 6 HOURS AS NEEDED FOR PAIN   No facility-administered encounter medications on file as of 07/16/2018.      Review of Systems  Constitutional: Negative.   HENT: Negative.   Eyes: Negative.   Respiratory: Negative.   Cardiovascular: Negative.   Gastrointestinal: Negative.   Endocrine: Negative.   Genitourinary: Negative.   Musculoskeletal: Positive for back pain (following ortho ).  Skin: Negative.   Allergic/Immunologic: Negative.   Neurological: Negative.   Hematological: Negative.   Psychiatric/Behavioral: Negative.  Objective:   Physical Exam  Constitutional: She is oriented to person, place, and time. She appears well-developed and well-nourished. No distress.  Patient is smiling pleasant and doing well.  HENT:  Head: Normocephalic and atraumatic.  Right Ear: External ear normal.  Left Ear: External ear normal.  Nose: Nose normal.  Mouth/Throat: Oropharynx is clear and moist. No oropharyngeal exudate.  Eyes: Pupils are equal, round, and reactive to light. Conjunctivae and EOM are normal. Right eye exhibits no discharge. Left eye exhibits no discharge. No scleral icterus.  Up-to-date on eye  exam  Neck: Normal range of motion. Neck supple. No thyromegaly present.  No bruits thyromegaly or anterior cervical adenopathy  Cardiovascular: Normal rate, regular rhythm, normal heart sounds and intact distal pulses.  No murmur heard. Heart is regular at 60/min  Pulmonary/Chest: Effort normal and breath sounds normal. She has no wheezes. She has no rales.  Abdominal: Soft. Bowel sounds are normal. She exhibits no mass. There is no tenderness.  No liver or spleen enlargement.  No epigastric tenderness.  No inguinal adenopathy.  No masses and no bruits.  Musculoskeletal: Normal range of motion. She exhibits no edema or tenderness.  Some pain in the left sacroiliac area with laying down and getting off the table and positioning her legs so that there is less stress on her back.  Lymphadenopathy:    She has no cervical adenopathy.  Neurological: She is alert and oriented to person, place, and time. She has normal reflexes. No cranial nerve deficit.  Reflexes were 2+ and equal in lower extremities.  Skin: Skin is warm and dry. No rash noted.  Psychiatric: She has a normal mood and affect. Her behavior is normal. Judgment and thought content normal.  Mood affect and behavior were positive.  She will continue with his Zoloft and also continue with the cognitive studies planned by the neurologist that has been seeing her for her mild memory impairment.  Nursing note and vitals reviewed.   BP 129/82 (BP Location: Left Arm)   Pulse 64   Temp (!) 97.1 F (36.2 C) (Oral)   Ht 5' 8"  (1.727 m)   Wt 185 lb (83.9 kg)   BMI 28.13 kg/m        Assessment & Plan:  1. Pure hypercholesterolemia -Continue with current treatment pending results of lab work - BMP8+EGFR - CBC with Differential/Platelet - Lipid panel - Hepatic function panel  2. Vitamin D deficiency -Continue with vitamin D replacement - CBC with Differential/Platelet - VITAMIN D 25 Hydroxy (Vit-D Deficiency, Fractures)  3.  Depression, recurrent ([place]) -Continue with Zoloft and exercise regimen  4. Memory changes -Continue follow-up with neurologist and cognitive evaluation that he has plan for patient  5. Anxiety -Continue with Zoloft.  6.  Low back pain -Continue follow-up with orthopedics  Patient Instructions                       Medicare Annual Wellness Visit  [facility] and the medical providers at [facility] strive to bring you the best medical care.  In doing so we not only want to address your current medical conditions and concerns but also to detect new conditions early and prevent illness, disease and health-related problems.    Medicare offers a yearly Wellness Visit which allows our clinical staff to assess your need for preventative services including immunizations, lifestyle education, counseling to decrease risk of preventable diseases and screening for fall risk and other medical  concerns.    This visit is provided free of charge (no copay) for all Medicare recipients. The clinical pharmacists at [facility] have begun to conduct these Wellness Visits which will also include a thorough review of all your medications.    As you primary medical provider recommend that you make an appointment for your Annual Wellness Visit if you have not done so already this year.  You may set up this appointment before you leave today or you may call back ([phone]) and schedule an appointment.  Please make sure when you call that you mention that you are scheduling your Annual Wellness Visit with the clinical pharmacist so that the appointment may be made for the proper length of time.     Continue current medications. Continue good therapeutic lifestyle changes which include good diet and exercise. Fall precautions discussed with patient. If an FOBT was given today- please return it to our front desk. If you are over 80 years old - you may need Prevnar 75 or  the adult Pneumonia vaccine.  **Flu shots are available--- please call and schedule a FLU-CLINIC appointment**  After your visit with [place] today you will receive a survey in the mail or online from [employer] regarding your care with [place]. Please take a moment to fill this out. Your feedback is very important to [place] as you can help [place] better understand your patient needs as well as improve your experience and satisfaction. WE CARE ABOUT YOU!!!     Keep follow up with neurology and ortho. Continue with Zoloft Drink plenty of fluids and stay well-hydrated   Arrie Senate MD

## 2018-07-17 ENCOUNTER — Ambulatory Visit: Payer: Medicare Other | Admitting: Family Medicine

## 2018-07-17 DIAGNOSIS — M545 Low back pain: Secondary | ICD-10-CM | POA: Diagnosis not present

## 2018-07-17 LAB — HEPATIC FUNCTION PANEL
ALT: 40 IU/L — ABNORMAL HIGH (ref 0–32)
AST: 16 IU/L (ref 0–40)
Albumin: 4.6 g/dL (ref 3.5–4.8)
Alkaline Phosphatase: 69 IU/L (ref 39–117)
Bilirubin Total: 0.6 mg/dL (ref 0.0–1.2)
Bilirubin, Direct: 0.16 mg/dL (ref 0.00–0.40)
Total Protein: 7.1 g/dL (ref 6.0–8.5)

## 2018-07-17 LAB — BMP8+EGFR
BUN/Creatinine Ratio: 36 — ABNORMAL HIGH (ref 12–28)
BUN: 25 mg/dL (ref 8–27)
CO2: 25 mmol/L (ref 20–29)
Calcium: 10 mg/dL (ref 8.7–10.3)
Chloride: 98 mmol/L (ref 96–106)
Creatinine, Ser: 0.69 mg/dL (ref 0.57–1.00)
GFR calc Af Amer: 99 mL/min/{1.73_m2} (ref >59)
GFR calc non Af Amer: 86 mL/min/{1.73_m2} (ref >59)
Glucose: 94 mg/dL (ref 65–99)
Potassium: 4.4 mmol/L (ref 3.5–5.2)
Sodium: 138 mmol/L (ref 134–144)

## 2018-07-17 LAB — CBC WITH DIFFERENTIAL/PLATELET
Basophils Absolute: 0 10*3/uL (ref 0.0–0.2)
Basos: 0 %
EOS (ABSOLUTE): 0 10*3/uL (ref 0.0–0.4)
Eos: 0 %
Hematocrit: 43.2 % (ref 34.0–46.6)
Hemoglobin: 14.8 g/dL (ref 11.1–15.9)
Immature Grans (Abs): 0.1 10*3/uL (ref 0.0–0.1)
Immature Granulocytes: 1 %
Lymphocytes Absolute: 1.1 10*3/uL (ref 0.7–3.1)
Lymphs: 13 %
MCH: 32.5 pg (ref 26.6–33.0)
MCHC: 34.3 g/dL (ref 31.5–35.7)
MCV: 95 fL (ref 79–97)
Monocytes Absolute: 0.7 10*3/uL (ref 0.1–0.9)
Monocytes: 9 %
Neutrophils Absolute: 6.2 10*3/uL (ref 1.4–7.0)
Neutrophils: 77 %
Platelets: 305 10*3/uL (ref 150–450)
RBC: 4.55 x10E6/uL (ref 3.77–5.28)
RDW: 12.6 % (ref 12.3–15.4)
WBC: 8.1 10*3/uL (ref 3.4–10.8)

## 2018-07-17 LAB — LIPID PANEL
Chol/HDL Ratio: 2.2 ratio (ref 0.0–4.4)
Cholesterol, Total: 203 mg/dL — ABNORMAL HIGH (ref 100–199)
HDL: 91 mg/dL (ref >39)
LDL Calculated: 95 mg/dL (ref 0–99)
Triglycerides: 87 mg/dL (ref 0–149)
VLDL Cholesterol Cal: 17 mg/dL (ref 5–40)

## 2018-07-17 LAB — VITAMIN D 25 HYDROXY (VIT D DEFICIENCY, FRACTURES): Vit D, 25-Hydroxy: 43.2 ng/mL (ref 30.0–100.0)

## 2018-07-22 DIAGNOSIS — M9903 Segmental and somatic dysfunction of lumbar region: Secondary | ICD-10-CM | POA: Diagnosis not present

## 2018-07-22 DIAGNOSIS — M5116 Intervertebral disc disorders with radiculopathy, lumbar region: Secondary | ICD-10-CM | POA: Diagnosis not present

## 2018-08-12 DIAGNOSIS — M9903 Segmental and somatic dysfunction of lumbar region: Secondary | ICD-10-CM | POA: Diagnosis not present

## 2018-08-12 DIAGNOSIS — M5116 Intervertebral disc disorders with radiculopathy, lumbar region: Secondary | ICD-10-CM | POA: Diagnosis not present

## 2018-08-13 ENCOUNTER — Other Ambulatory Visit: Payer: Self-pay | Admitting: Family Medicine

## 2018-08-14 ENCOUNTER — Encounter: Payer: Self-pay | Admitting: *Deleted

## 2018-08-14 ENCOUNTER — Ambulatory Visit (INDEPENDENT_AMBULATORY_CARE_PROVIDER_SITE_OTHER): Payer: Medicare Other | Admitting: *Deleted

## 2018-08-14 VITALS — BP 127/74 | HR 78 | Ht 67.0 in | Wt 188.0 lb

## 2018-08-14 DIAGNOSIS — Z0000 Encounter for general adult medical examination without abnormal findings: Secondary | ICD-10-CM

## 2018-08-14 NOTE — Progress Notes (Addendum)
Subjective:   Kelly George is a 72 y.o. female who presents for an initial Medicare Annual Wellness Visit.  Patient Care Team: Chipper Herb, MD as PCP - General (Family Medicine) Paula Compton, MD (Obstetrics and Gynecology) Pieter Partridge, DO as Consulting Physician (Neurology)  Hospitalizations, surgeries, and ER visits in previous 12 months No hospitalizations, ER visits, or surgeries this past year.  Review of Systems    Patient reports that her overall health is unchanged compared to last year.  Cardiac Risk Factors include: advanced age (>83men, >52 women)  Musculoskeletal: chronic back pain. Scoliosis. Sees a chiropractor and that has helped  Neuro: dizziness. Lifelong problem but referred to neurologist for evaluation a few months ago.    All other systems negative       Current Medications (verified) Outpatient Encounter Medications as of 08/14/2018  Medication Sig  . calcium-vitamin D (OSCAL WITH D) 500-200 MG-UNIT per tablet Take 1 tablet by mouth daily.  . Cholecalciferol (VITAMIN D3) 2000 UNITS TABS Take 5,000 Units by mouth daily.   . Cyanocobalamin (VITAMIN B 12 PO) Take by mouth. 500 mg OTC one a day  . fish oil-omega-3 fatty acids 1000 MG capsule Take 1,200 mg by mouth daily.   . fluticasone (FLONASE) 50 MCG/ACT nasal spray SPRAY 2 SPRAYS INTO EACH NOSTRIL EVERY DAY  . glucosamine-chondroitin 500-400 MG tablet Take 1 tablet by mouth 2 (two) times daily.   Marland Kitchen loratadine (CLARITIN) 10 MG tablet Take 10 mg by mouth daily.  . naproxen sodium (ANAPROX) 220 MG tablet Take 220 mg by mouth daily. TAKE TWO TABLETS ONCE DAILY  . sertraline (ZOLOFT) 25 MG tablet TAKE 1 TABLET BY MOUTH EVERY DAY  . simvastatin (ZOCOR) 10 MG tablet TAKE 1 TABLET BY MOUTH EVERYDAY AT BEDTIME  . traMADol (ULTRAM) 50 MG tablet TAKE 1 TABLET EVERY 4 TO 6 HOURS AS NEEDED FOR PAIN   No facility-administered encounter medications on file as of 08/14/2018.     Allergies  (verified) Penicillins and Tetracyclines & related   History: Past Medical History:  Diagnosis Date  . Anxiety   . Depression   . Hyperlipidemia   . Irregular heart beat   . Mitral valve prolapse   . Seasonal allergies    Past Surgical History:  Procedure Laterality Date  . BREAST EXCISIONAL BIOPSY Left   . COLONOSCOPY  07/07/01  . SKIN SURGERY     DERM - nasal   . TUBAL LIGATION     Family History  Problem Relation Age of Onset  . Breast cancer Mother   . COPD Mother   . Diabetes Father   . Cancer Father        Brain  . Heart attack Father 56  . Uterine cancer Maternal Aunt   . Heart disease Brother   . High Cholesterol Brother   . Autoimmune disease Daughter   . Ankylosing spondylitis Daughter   . Healthy Son    Social History   Socioeconomic History  . Marital status: Divorced    Spouse name: Not on file  . Number of children: 2  . Years of education: Not on file  . Highest education level: Associate degree: academic program  Occupational History  . Occupation: Retired  Scientific laboratory technician  . Financial resource strain: Not hard at all  . Food insecurity:    Worry: Never true    Inability: Never true  . Transportation needs:    Medical: No    Non-medical: No  Tobacco Use  . Smoking status: Never Smoker  . Smokeless tobacco: Never Used  Substance and Sexual Activity  . Alcohol use: Yes    Alcohol/week: 8.0 standard drinks    Types: 8 Glasses of wine per week  . Drug use: No  . Sexual activity: Not Currently  Lifestyle  . Physical activity:    Days per week: 0 days    Minutes per session: 0 min  . Stress: Only a little  Relationships  . Social connections:    Talks on phone: More than three times a week    Gets together: More than three times a week    Attends religious service: Never    Active member of club or organization: No    Attends meetings of clubs or organizations: Never    Relationship status: Divorced  Other Topics Concern  . Not on  file  Social History Narrative   Live alone.     Patient is right-handed. She lives alone in a 1 story house, 3-4 steps to enter.      Clinical Intake:     Pain : No/denies pain(has some back pain chronically but doesn't affect daily life)     Nutritional Status: BMI 25 -29 Overweight(3 meals a day. Typically home prepared meals. Only drinks water. ) Diabetes: No  How often do you need to have someone help you when you read instructions, pamphlets, or other written materials from your doctor or pharmacy?: 1 - Never What is the last grade level you completed in school?: 14   Interpreter Needed?: No  Information entered by :: Chong Sicilian, RN   Activities of Daily Living In your present state of health, do you have any difficulty performing the following activities: 08/14/2018  Hearing? Y  Comment has some hearing loss but doesn't affect ability to function. No exams.  Vision? N  Difficulty concentrating or making decisions? Y  Comment seeing neurology for some memory loss. Having cognitive testing.   Walking or climbing stairs? N  Dressing or bathing? N  Doing errands, shopping? N  Preparing Food and eating ? N  Using the Toilet? N  In the past six months, have you accidently leaked urine? N  Do you have problems with loss of bowel control? N  Managing your Medications? N  Comment Fills a pill box monthly  Managing your Finances? N  Housekeeping or managing your Housekeeping? N  Some recent data might be hidden     Exercise Current Exercise Habits: Structured exercise class, Type of exercise: walking;strength training/weights(personal trainer), Time (Minutes): 60, Frequency (Times/Week): 2, Weekly Exercise (Minutes/Week): 120, Intensity: Mild, Exercise limited by: None identified   Depression Screen PHQ 2/9 Scores 08/14/2018 07/16/2018 05/25/2018 03/11/2018 11/07/2017 06/25/2017 02/19/2017  PHQ - 2 Score 2 0 2 2 0 2 0  PHQ- 9 Score 2 - 6 7 - 5 -     Fall Risk Fall  Risk  08/14/2018 08/14/2018 07/16/2018 05/25/2018 05/20/2018  Falls in the past year? - 1 Yes Yes Yes  Number falls in past yr: - 0 2 or more 2 or more 2 or more  Injury with Fall? - 0 No No No  Risk Factor Category  - - - - High Fall Risk  Risk for fall due to : (No Data) History of fall(s);Impaired balance/gait - - -  Risk for fall due to: Comment Stairs onto porch with rails. Tub without handles or shower seat. - - - -  Follow up - Falls prevention  discussed - - -     Objective:    Today's Vitals   08/14/18 1514  BP: 127/74  Pulse: 78  Weight: 188 lb (85.3 kg)  Height: 5\' 7"  (1.702 m)   Body mass index is 29.44 kg/m.  Advanced Directives 08/14/2018  Does Patient Have a Medical Advance Directive? No  Would patient like information on creating a medical advance directive? Yes (MAU/Ambulatory/Procedural Areas - Information given)    Hearing/Vision  No hearing or vision deficits noted during visit.  Cognitive Function: MMSE - Mini Mental State Exam 08/14/2018 05/20/2018 03/11/2018 06/25/2017  Orientation to time 5 5 5 5   Orientation to Place 5 5 5 5   Registration 3 3 3 3   Attention/ Calculation 5 5 5 5   Recall 3 2 3 1   Language- name 2 objects 2 2 2 2   Language- repeat 1 1 1 1   Language- follow 3 step command 3 3 3 3   Language- read & follow direction 1 1 1 1   Write a sentence 1 1 1 1   Copy design 1 0 1 1  Total score 30 28 30 28        Normal Cognitive Function Screening: Yes    Immunizations and Health Maintenance Immunization History  Administered Date(s) Administered  . Influenza Whole 08/28/2012  . Influenza, High Dose Seasonal PF 08/18/2015, 09/30/2016, 08/04/2017, 07/16/2018  . Influenza,inj,Quad PF,6+ Mos 07/08/2013, 08/10/2014  . Pneumococcal Conjugate-13 01/05/2014  . Pneumococcal Polysaccharide-23 04/13/2010  . Tdap 08/06/2011  . Zoster 01/13/2007   There are no preventive care reminders to display for this patient. Health Maintenance  Topic Date Due  .  Hepatitis C Screening  03/12/2019 (Originally 07-11-1946)  . MAMMOGRAM  06/24/2020  . TETANUS/TDAP  08/05/2021  . COLONOSCOPY  07/06/2022  . INFLUENZA VACCINE  Completed  . DEXA SCAN  Completed  . PNA vac Low Risk Adult  Completed        Assessment:   This is a routine wellness examination for [place].    Plan:    Goals    . Exercise 150 min/wk Moderate Activity        Health Maintenance Recommendations: No recommendations at this time   Additional Screening Recommendations: Lung: Low Dose CT Chest recommended if Age 24-80 years, 30 pack-year currently smoking OR have quit w/in 15years. Patient does not qualify. Hepatitis C Screening recommended: no  Keep f/u with Chipper Herb, MD and any other specialty appointments you may have Continue current medications Move carefully and change positions slowly to avoid falls. Aim for at least 150 minutes of moderate activity a week. Read or work on puzzles daily Stay connected with friends and family  I have personally reviewed and noted the following in the patient's chart:   . Medical and social history . Use of alcohol, tobacco or illicit drugs  . Current medications and supplements . Functional ability and status . Nutritional status . Physical activity . Advanced directives . List of other physicians . Hospitalizations, surgeries, and ER visits in previous 12 months . Vitals . Screenings to include cognitive, depression, and falls . Referrals and appointments  In addition, I have reviewed and discussed with patient certain preventive protocols, quality metrics, and best practice recommendations. A written personalized care plan for preventive services as well as general preventive health recommendations were provided to patient.     Chong Sicilian, RN   08/14/2018   I have reviewed and agree with the above AWV documentation.   Arrie Senate MD

## 2018-08-14 NOTE — Patient Instructions (Signed)
  Kelly George , Thank you for taking time to come for your Medicare Wellness Visit. I appreciate your ongoing commitment to your health goals. Please review the following plan we discussed and let me know if I can assist you in the future.   These are the goals we discussed: Goals    . Exercise 150 min/wk Moderate Activity       This is a list of the screening recommended for you and due dates:  Health Maintenance  Topic Date Due  .  Hepatitis C: One time screening is recommended by Center for Disease Control  (CDC) for  adults born from [birth year] through 1967.   03/12/2019*  . Mammogram  06/24/2020  . Tetanus Vaccine  08/05/2021  . Colon Cancer Screening  07/06/2022  . Flu Shot  Completed  . DEXA scan (bone density measurement)  Completed  . Pneumonia vaccines  Completed  *Topic was postponed. The date shown is not the original due date.

## 2018-08-18 ENCOUNTER — Telehealth: Payer: Self-pay | Admitting: Neurology

## 2018-08-18 NOTE — Telephone Encounter (Signed)
Dr Bailar is leaving for a new job closer to home we have mailed a letter to the patient to inform them that we canceled all appointments with Dr Bailar. We thank you for the understanding °

## 2018-08-26 DIAGNOSIS — M9903 Segmental and somatic dysfunction of lumbar region: Secondary | ICD-10-CM | POA: Diagnosis not present

## 2018-08-26 DIAGNOSIS — M5116 Intervertebral disc disorders with radiculopathy, lumbar region: Secondary | ICD-10-CM | POA: Diagnosis not present

## 2018-09-03 DIAGNOSIS — M9903 Segmental and somatic dysfunction of lumbar region: Secondary | ICD-10-CM | POA: Diagnosis not present

## 2018-09-03 DIAGNOSIS — M5116 Intervertebral disc disorders with radiculopathy, lumbar region: Secondary | ICD-10-CM | POA: Diagnosis not present

## 2018-09-14 ENCOUNTER — Other Ambulatory Visit: Payer: Self-pay | Admitting: Neurology

## 2018-09-16 ENCOUNTER — Telehealth: Payer: Self-pay

## 2018-09-16 NOTE — Telephone Encounter (Signed)
Spoke with patient regarding letter received (Dr. Si Raider leaving). Patient would like our help in referring her to another neuropsychologist. She prefers not to go to [facility] but is open to going to [facility] in [place] with Dr. Adelina Mings.

## 2018-09-22 NOTE — Telephone Encounter (Signed)
Faxing referrral

## 2018-09-25 NOTE — Telephone Encounter (Signed)
Rcvd communication from [facility], unfortunately, they do not accept [facility].  Called and spoke with Pt, advised her. She would like to wait and discuss with Dr Tomi Likens at her January appt

## 2018-10-15 NOTE — Progress Notes (Deleted)
NEUROLOGY FOLLOW UP OFFICE NOTE  [name] [id number]  HISTORY OF PRESENT ILLNESS: Kelly George is a 73 year old right-handed female with hyperlipidemia, depression, and anxiety who follows up for dizziness and memory changes.  UPDATE:  MRI of the brain performed on 06/02/2018 was personally reviewed and was unremarkable except for just mild atrophy and chronic microvascular ischemic changes.  She was restarted on sertraline***  Unfortunately, our neuropsychologist left to pursue another job and Kelly George was unable to have neuropsychological testing.  HISTORY: 1  DIZZINESS: She has had dizzy spells since her 7s or 30s.  They are triggered by looking at fast movement (such as watching a movie), looking up or bending over.  She describes it as a woozy sensation but it is not spinning or near syncope.  There is associated nausea.  There is no associated headache, visual disturbance (double vision, aura, vision loss), photophobia, phonophobia or unilateral numbness or weakness.  It typically lasts at least 2 to 3 hours.  It is aggravated by movement and relieved by staying still.  She reports tinnitus and trouble hearing.  She feels unsteady but has not fallen.  They would initially occur once in awhile.  She reports increased spells over the past 3 months.  Since that time, she has started seeing a Physiological scientist and the gym.  Sometimes, exercising triggers a dizzy spell.  For many years, she has been on sertraline 50mg .  Over the past several weeks, she has slowly tapered herself off.  She reports no history of migraines or other headaches.  However, her daughter has migraines and her mother had migraines.  2  MEMORY CHANGES: She began noticing memory problems for the past 5 years.  It is short term memory such as recalling recent conversations or misplacing items at home.  Infrequently, she may feel a little disoriented even when driving on familiar routes but hasn't gotten lost.   She lives by herself and manages her bills and medication without difficulty, although most of her bills are on autopay.  There is no known family history of dementia..  03/11/18 LABS:  B12 651; TSH 2.060, total T4 7.3; D 25-hydroxy 53.2  PAST MEDICAL HISTORY: Past Medical History:  Diagnosis Date  . Anxiety   . Depression   . Hyperlipidemia   . Irregular heart beat   . Mitral valve prolapse   . Seasonal allergies     MEDICATIONS: Current Outpatient Medications on File Prior to Visit  Medication Sig Dispense Refill  . calcium-vitamin D (OSCAL WITH D) 500-200 MG-UNIT per tablet Take 1 tablet by mouth daily.    . Cholecalciferol (VITAMIN D3) 2000 UNITS TABS Take 5,000 Units by mouth daily.     . Cyanocobalamin (VITAMIN B 12 PO) Take by mouth. 500 mg OTC one a day    . fish oil-omega-3 fatty acids 1000 MG capsule Take 1,200 mg by mouth daily.     . fluticasone (FLONASE) 50 MCG/ACT nasal spray SPRAY 2 SPRAYS INTO EACH NOSTRIL EVERY DAY 48 g 1  . glucosamine-chondroitin 500-400 MG tablet Take 1 tablet by mouth 2 (two) times daily.     Marland Kitchen loratadine (CLARITIN) 10 MG tablet Take 10 mg by mouth daily.    . naproxen sodium (ANAPROX) 220 MG tablet Take 220 mg by mouth daily. TAKE TWO TABLETS ONCE DAILY    . sertraline (ZOLOFT) 25 MG tablet TAKE 1 TABLET BY MOUTH EVERY DAY 90 tablet 0  . simvastatin (ZOCOR) 10 MG tablet TAKE 1  TABLET BY MOUTH EVERYDAY AT BEDTIME 90 tablet 1  . traMADol (ULTRAM) 50 MG tablet TAKE 1 TABLET EVERY 4 TO 6 HOURS AS NEEDED FOR PAIN  0   No current facility-administered medications on file prior to visit.     ALLERGIES: Allergies  Allergen Reactions  . Penicillins Rash  . Tetracyclines & Related Palpitations    FAMILY HISTORY: Family History  Problem Relation Age of Onset  . Breast cancer Mother   . COPD Mother   . Diabetes Father   . Cancer Father        Brain  . Heart attack Father 10  . Uterine cancer Maternal Aunt   . Heart disease Brother   . High  Cholesterol Brother   . Autoimmune disease Daughter   . Ankylosing spondylitis Daughter   . Healthy Son    ***.  SOCIAL HISTORY: Social History   Socioeconomic History  . Marital status: Divorced    Spouse name: Not on file  . Number of children: 2  . Years of education: Not on file  . Highest education level: Associate degree: academic program  Occupational History  . Occupation: Retired  Scientific laboratory technician  . Financial resource strain: Not hard at all  . Food insecurity:    Worry: Never true    Inability: Never true  . Transportation needs:    Medical: No    Non-medical: No  Tobacco Use  . Smoking status: Never Smoker  . Smokeless tobacco: Never Used  Substance and Sexual Activity  . Alcohol use: Yes    Alcohol/week: 8.0 standard drinks    Types: 8 Glasses of wine per week  . Drug use: No  . Sexual activity: Not Currently  Lifestyle  . Physical activity:    Days per week: 0 days    Minutes per session: 0 min  . Stress: Only a little  Relationships  . Social connections:    Talks on phone: More than three times a week    Gets together: More than three times a week    Attends religious service: Never    Active member of club or organization: No    Attends meetings of clubs or organizations: Never    Relationship status: Divorced  . Intimate partner violence:    Fear of current or ex partner: No    Emotionally abused: No    Physically abused: No    Forced sexual activity: No  Other Topics Concern  . Not on file  Social History Narrative   Live alone.     Patient is right-handed. She lives alone in a 1 story house, 3-4 steps to enter.     REVIEW OF SYSTEMS: Constitutional: No fevers, chills, or sweats, no generalized fatigue, change in appetite Eyes: No visual changes, double vision, eye pain Ear, nose and throat: No hearing loss, ear pain, nasal congestion, sore throat Cardiovascular: No chest pain, palpitations Respiratory:  No shortness of breath at rest or  with exertion, wheezes GastrointestinaI: No nausea, vomiting, diarrhea, abdominal pain, fecal incontinence Genitourinary:  No dysuria, urinary retention or frequency Musculoskeletal:  No neck pain, back pain Integumentary: No rash, pruritus, skin lesions Neurological: as above Psychiatric: No depression, insomnia, anxiety Endocrine: No palpitations, fatigue, diaphoresis, mood swings, change in appetite, change in weight, increased thirst Hematologic/Lymphatic:  No purpura, petechiae. Allergic/Immunologic: no itchy/runny eyes, nasal congestion, recent allergic reactions, rashes  PHYSICAL EXAM: *** General: No acute distress.  Patient appears ***-groomed.  *** body habitus. Head:  Normocephalic/atraumatic Eyes:  Fundi examined but not visualized Neck: supple, no paraspinal tenderness, full range of motion Heart:  Regular rate and rhythm Lungs:  Clear to auscultation bilaterally Back: No paraspinal tenderness Neurological Exam: alert and oriented to person, place, and time. Attention span and concentration intact, recent and remote memory intact, fund of knowledge intact.  Speech fluent and not dysarthric, language intact.  CN II-XII intact. Bulk and tone normal, muscle strength 5/5 throughout.  Sensation to light touch, temperature and vibration intact.  Deep tendon reflexes 2+ throughout, toes downgoing.  Finger to nose and heel to shin testing intact.  Gait normal, Romberg negative.  IMPRESSION: ***  PLAN: ***  Metta Clines, DO  CC: ***

## 2018-10-19 ENCOUNTER — Ambulatory Visit: Payer: Medicare Other | Admitting: Neurology

## 2018-10-19 DIAGNOSIS — M9903 Segmental and somatic dysfunction of lumbar region: Secondary | ICD-10-CM | POA: Diagnosis not present

## 2018-10-19 DIAGNOSIS — M5116 Intervertebral disc disorders with radiculopathy, lumbar region: Secondary | ICD-10-CM | POA: Diagnosis not present

## 2018-10-20 DIAGNOSIS — M545 Low back pain: Secondary | ICD-10-CM | POA: Diagnosis not present

## 2018-11-02 DIAGNOSIS — M5116 Intervertebral disc disorders with radiculopathy, lumbar region: Secondary | ICD-10-CM | POA: Diagnosis not present

## 2018-11-02 DIAGNOSIS — M9903 Segmental and somatic dysfunction of lumbar region: Secondary | ICD-10-CM | POA: Diagnosis not present

## 2018-11-02 NOTE — Progress Notes (Signed)
NEUROLOGY FOLLOW UP OFFICE NOTE  Kelly George [id number]  HISTORY OF PRESENT ILLNESS: Kelly George is a 73 year old right-handed female with hyperlipidemia, depression, and anxiety who follows up for dizziness and memory changes.  UPDATE:  MRI of the brain performed on 06/02/2018 was personally reviewed and was unremarkable except for just mild atrophy and chronic microvascular ischemic changes.  She was restarted on sertraline 25mg  daily.  She still has dizzy spells but improved.  They are not as triggered as easily.  Last one was a month ago.  No change in memory.  Unfortunately, our neuropsychologist left to pursue another job and Ms. Yetter was unable to have neuropsychological testing.  HISTORY: 1  DIZZINESS: She has had dizzy spells since her 22s or 30s.  They are triggered by looking at fast movement (such as watching a movie), looking up or bending over.  She describes it as a woozy sensation but it is not spinning or near syncope.  There is associated nausea.  There is no associated headache, visual disturbance (double vision, aura, vision loss), photophobia, phonophobia or unilateral numbness or weakness.  It typically lasts at least 2 to 3 hours.  It is aggravated by movement and relieved by staying still.  She reports tinnitus and trouble hearing.  She feels unsteady but has not fallen.  They would initially occur once in awhile.  She reports increased spells over the past 3 months.  Since that time, she has started seeing a Physiological scientist and the gym.  Sometimes, exercising triggers a dizzy spell.  She reports no history of migraines or other headaches.  However, her daughter has migraines and her mother had migraines.  2  MEMORY CHANGES: She began noticing memory problems for the past 5 years.  It is short term memory such as recalling recent conversations or misplacing items at home.  Infrequently, she may feel a little disoriented even when driving on familiar routes but  hasn't gotten lost.  She lives by herself and manages her bills and medication without difficulty, although most of her bills are on autopay.  There is no known family history of dementia..  03/11/18 LABS:  B12 651; TSH 2.060, total T4 7.3; D 25-hydroxy 53.2  PAST MEDICAL HISTORY: Past Medical History:  Diagnosis Date  . Anxiety   . Depression   . Hyperlipidemia   . Irregular heart beat   . Mitral valve prolapse   . Seasonal allergies     MEDICATIONS: Current Outpatient Medications on File Prior to Visit  Medication Sig Dispense Refill  . calcium-vitamin D (OSCAL WITH D) 500-200 MG-UNIT per tablet Take 1 tablet by mouth daily.    . Cholecalciferol (VITAMIN D3) 2000 UNITS TABS Take 5,000 Units by mouth daily.     . Cyanocobalamin (VITAMIN B 12 PO) Take by mouth. 500 mg OTC one a day    . fish oil-omega-3 fatty acids 1000 MG capsule Take 1,200 mg by mouth daily.     . fluticasone (FLONASE) 50 MCG/ACT nasal spray SPRAY 2 SPRAYS INTO EACH NOSTRIL EVERY DAY 48 g 1  . glucosamine-chondroitin 500-400 MG tablet Take 1 tablet by mouth 2 (two) times daily.     Marland Kitchen loratadine (CLARITIN) 10 MG tablet Take 10 mg by mouth daily.    . naproxen sodium (ANAPROX) 220 MG tablet Take 220 mg by mouth daily. TAKE TWO TABLETS ONCE DAILY    . sertraline (ZOLOFT) 25 MG tablet TAKE 1 TABLET BY MOUTH EVERY DAY 90 tablet 0  .  simvastatin (ZOCOR) 10 MG tablet TAKE 1 TABLET BY MOUTH EVERYDAY AT BEDTIME 90 tablet 1  . traMADol (ULTRAM) 50 MG tablet TAKE 1 TABLET EVERY 4 TO 6 HOURS AS NEEDED FOR PAIN  0   No current facility-administered medications on file prior to visit.     ALLERGIES: Allergies  Allergen Reactions  . Penicillins Rash  . Tetracyclines & Related Palpitations    FAMILY HISTORY: Family History  Problem Relation Age of Onset  . Breast cancer Mother   . COPD Mother   . Diabetes Father   . Cancer Father        Brain  . Heart attack Father 14  . Uterine cancer Maternal Aunt   . Heart  disease Brother   . High Cholesterol Brother   . Autoimmune disease Daughter   . Ankylosing spondylitis Daughter   . Healthy Son     SOCIAL HISTORY: Social History   Socioeconomic History  . Marital status: Divorced    Spouse name: Not on file  . Number of children: 2  . Years of education: Not on file  . Highest education level: Associate degree: academic program  Occupational History  . Occupation: Retired  Scientific laboratory technician  . Financial resource strain: Not hard at all  . Food insecurity:    Worry: Never true    Inability: Never true  . Transportation needs:    Medical: No    Non-medical: No  Tobacco Use  . Smoking status: Never Smoker  . Smokeless tobacco: Never Used  Substance and Sexual Activity  . Alcohol use: Yes    Alcohol/week: 8.0 standard drinks    Types: 8 Glasses of wine per week  . Drug use: No  . Sexual activity: Not Currently  Lifestyle  . Physical activity:    Days per week: 0 days    Minutes per session: 0 min  . Stress: Only a little  Relationships  . Social connections:    Talks on phone: More than three times a week    Gets together: More than three times a week    Attends religious service: Never    Active member of club or organization: No    Attends meetings of clubs or organizations: Never    Relationship status: Divorced  . Intimate partner violence:    Fear of current or ex partner: No    Emotionally abused: No    Physically abused: No    Forced sexual activity: No  Other Topics Concern  . Not on file  Social History Narrative   Live alone.     Patient is right-handed. She lives alone in a 1 story house, 3-4 steps to enter.     REVIEW OF SYSTEMS: Constitutional: No fevers, chills, or sweats, no generalized fatigue, change in appetite Eyes: No visual changes, double vision, eye pain Ear, nose and throat: No hearing loss, ear pain, nasal congestion, sore throat Cardiovascular: No chest pain, palpitations Respiratory:  No shortness  of breath at rest or with exertion, wheezes GastrointestinaI: No nausea, vomiting, diarrhea, abdominal pain, fecal incontinence Genitourinary:  No dysuria, urinary retention or frequency Musculoskeletal:  No neck pain, back pain Integumentary: No rash, pruritus, skin lesions Neurological: as above Psychiatric: No depression, insomnia, anxiety Endocrine: No palpitations, fatigue, diaphoresis, mood swings, change in appetite, change in weight, increased thirst Hematologic/Lymphatic:  No purpura, petechiae. Allergic/Immunologic: no itchy/runny eyes, nasal congestion, recent allergic reactions, rashes  PHYSICAL EXAM: Blood pressure 126/84, pulse 72, height 5\' 8"  (1.727 m), weight  184 lb (83.5 kg), SpO2 99 %. General: No acute distress.  Patient appears well-groomed.   Head:  Normocephalic/atraumatic Eyes:  Fundi examined but not visualized Neck: supple, no paraspinal tenderness, full range of motion Heart:  Regular rate and rhythm Lungs:  Clear to auscultation bilaterally Back: No paraspinal tenderness Neurological Exam: alert and oriented to person, place, and time. Attention span and concentration intact, recent and remote memory intact, fund of knowledge intact.  Speech fluent and not dysarthric, language intact.  CN II-XII intact. Bulk and tone normal, muscle strength 5/5 throughout.  Sensation to light touch intact.  Deep tendon reflexes 2+ throughout.  Finger to nose and heel to shin testing intact.  Gait normal, Romberg negative.  IMPRESSION: 1.  Episodic dizziness.  Possibly migraine related although she does not have history of migraine headaches.  Migraines do run in her family.  They are not consistent with BPPV.  They seem to be well controlled on sertraline. 2.  Memory deficits.  No appreciable cognitive impairment but it is a concern for her.  PLAN: 1.  She will continue sertraline 25 mg daily. 2.  We will refer her to [facility] for neurocognitive testing. 3.   She will follow-up after testing.  50 minutes spent with the patient, over 50% spent discussing management.  Metta Clines, DO  CC: Redge Gainer, MD

## 2018-11-03 ENCOUNTER — Encounter: Payer: Self-pay | Admitting: Neurology

## 2018-11-03 ENCOUNTER — Ambulatory Visit (INDEPENDENT_AMBULATORY_CARE_PROVIDER_SITE_OTHER): Payer: Medicare Other | Admitting: Neurology

## 2018-11-03 VITALS — BP 126/84 | HR 72 | Ht 68.0 in | Wt 184.0 lb

## 2018-11-03 DIAGNOSIS — R413 Other amnesia: Secondary | ICD-10-CM | POA: Diagnosis not present

## 2018-11-03 DIAGNOSIS — R42 Dizziness and giddiness: Secondary | ICD-10-CM | POA: Diagnosis not present

## 2018-11-03 NOTE — Patient Instructions (Signed)
1.  We will refer you to [facility].  Follow up after testing. 2.  Continue sertraline 25mg  daily

## 2018-11-03 NOTE — Progress Notes (Signed)
Mailing referral to [facility]

## 2018-11-05 DIAGNOSIS — M5116 Intervertebral disc disorders with radiculopathy, lumbar region: Secondary | ICD-10-CM | POA: Diagnosis not present

## 2018-11-05 DIAGNOSIS — M9903 Segmental and somatic dysfunction of lumbar region: Secondary | ICD-10-CM | POA: Diagnosis not present

## 2018-11-06 DIAGNOSIS — M545 Low back pain: Secondary | ICD-10-CM | POA: Diagnosis not present

## 2018-11-09 DIAGNOSIS — M545 Low back pain: Secondary | ICD-10-CM | POA: Diagnosis not present

## 2018-11-17 DIAGNOSIS — R413 Other amnesia: Secondary | ICD-10-CM | POA: Diagnosis not present

## 2018-11-18 ENCOUNTER — Ambulatory Visit (INDEPENDENT_AMBULATORY_CARE_PROVIDER_SITE_OTHER): Payer: Medicare Other

## 2018-11-18 ENCOUNTER — Ambulatory Visit (INDEPENDENT_AMBULATORY_CARE_PROVIDER_SITE_OTHER): Payer: Medicare Other | Admitting: Family Medicine

## 2018-11-18 ENCOUNTER — Encounter: Payer: Self-pay | Admitting: Family Medicine

## 2018-11-18 VITALS — BP 127/84 | HR 66 | Temp 97.9°F | Ht 68.0 in | Wt 185.0 lb

## 2018-11-18 DIAGNOSIS — E559 Vitamin D deficiency, unspecified: Secondary | ICD-10-CM | POA: Diagnosis not present

## 2018-11-18 DIAGNOSIS — R413 Other amnesia: Secondary | ICD-10-CM

## 2018-11-18 DIAGNOSIS — E7800 Pure hypercholesterolemia, unspecified: Secondary | ICD-10-CM

## 2018-11-18 DIAGNOSIS — F419 Anxiety disorder, unspecified: Secondary | ICD-10-CM

## 2018-11-18 DIAGNOSIS — M5440 Lumbago with sciatica, unspecified side: Secondary | ICD-10-CM

## 2018-11-18 NOTE — Patient Instructions (Addendum)
Medicare Annual Wellness Visit  [facility] and the medical providers at [facility] strive to bring you the best medical care.  In doing so we not only want to address your current medical conditions and concerns but also to detect new conditions early and prevent illness, disease and health-related problems.    Medicare offers a yearly Wellness Visit which allows our clinical staff to assess your need for preventative services including immunizations, lifestyle education, counseling to decrease risk of preventable diseases and screening for fall risk and other medical concerns.    This visit is provided free of charge (no copay) for all Medicare recipients. The clinical pharmacists at [facility] have begun to conduct these Wellness Visits which will also include a thorough review of all your medications.    As you primary medical provider recommend that you make an appointment for your Annual Wellness Visit if you have not done so already this year.  You may set up this appointment before you leave today or you may call back ([phone]) and schedule an appointment.  Please make sure when you call that you mention that you are scheduling your Annual Wellness Visit with the clinical pharmacist so that the appointment may be made for the proper length of time.     Continue current medications. Continue good therapeutic lifestyle changes which include good diet and exercise. Fall precautions discussed with patient. If an FOBT was given today- please return it to our front desk. If you are over 45 years old - you may need Prevnar 67 or the adult Pneumonia vaccine.  **Flu shots are available--- please call and schedule a FLU-CLINIC appointment**  After your visit with [place] today you will receive a survey in the mail or online from [employer] regarding your care with [place]. Please take a moment to fill this out. Your feedback is very  important to [place] as you can help [place] better understand your patient needs as well as improve your experience and satisfaction. WE CARE ABOUT YOU!!!   Follow-up with neuropsychologist as planned Follow-up with orthopedist as planned Do not forget that your next colonoscopy would be due in 2023.

## 2018-11-18 NOTE — Addendum Note (Signed)
Addended by: Zannie Cove on: 11/18/2018 12:38 PM   Modules accepted: Orders

## 2018-11-18 NOTE — Progress Notes (Signed)
Subjective:    Patient ID: Kelly George, female    DOB: 1945-11-15, 73 y.o.   MRN: [id number]  HPI Pt here for follow up and management of chronic medical problems which includes hyperlipidemia. She is taking medication regularly.  Patient is doing well overall but does complain with some back pain today.  She is being followed by Dr. Alfonso Ramus.  She brings a copy of an MRI report which showed thoracolumbar curvature convex to the right and lower lumbar curvature convex to the left.  She has some stenosis at L2 and 3 that could cause some neural compression on either side degenerative changes at L3 and 4 and 5 and some stenosis at L5-S1.  She is also seeing a neuropsychologist in [place] regarding her memory and has another appointment coming up in this month for results of the initial studies on February 18.  Patient's daughter comes with her to the visit today. She denies any chest pain pressure tightness or shortness of breath.  She has seen the cardiologist in the past but has no follow-up appointment that she is aware of.  She denies any trouble with swallowing heartburn indigestion nausea vomiting diarrhea or blood in the stool.  Her last colonoscopy was in 2013 and there is no family history of colon cancer and no polyps were found at that time according to the patient.  She is passing her water well although getting up some at nighttime to void.  She does have this return appointment scheduled in [place] with the neuropsychologist and will get results and have those sent to [place] for review.  She may be getting some injections in her back because of the spinal stenosis and if that does not help would be considering surgery.    Patient Active Problem List   Diagnosis Date Noted  . Recurrent major depressive disorder, in full remission ([place]) 02/19/2017  . Precordial chest pain 07/07/2015  . Pain in joint of right foot 05/16/2014  . Vitamin D deficiency 07/08/2013  . Mitral valve prolapse  syndrome, history of mild 01/05/2013  . Seasonal allergies   . Anxiety   . Depression   . Hyperlipidemia    Outpatient Encounter Medications as of 11/18/2018  Medication Sig  . calcium-vitamin D (OSCAL WITH D) 500-200 MG-UNIT per tablet Take 1 tablet by mouth daily.  . Cholecalciferol (VITAMIN D3) 2000 UNITS TABS Take 5,000 Units by mouth daily.   . Cyanocobalamin (VITAMIN B 12 PO) Take by mouth. 500 mg OTC one a day  . fish oil-omega-3 fatty acids 1000 MG capsule Take 1,200 mg by mouth daily.   . fluticasone (FLONASE) 50 MCG/ACT nasal spray SPRAY 2 SPRAYS INTO EACH NOSTRIL EVERY DAY  . glucosamine-chondroitin 500-400 MG tablet Take 1 tablet by mouth 2 (two) times daily.   Marland Kitchen loratadine (CLARITIN) 10 MG tablet Take 10 mg by mouth daily.  . naproxen sodium (ANAPROX) 220 MG tablet Take 220 mg by mouth daily. TAKE TWO TABLETS ONCE DAILY  . sertraline (ZOLOFT) 25 MG tablet TAKE 1 TABLET BY MOUTH EVERY DAY  . simvastatin (ZOCOR) 10 MG tablet TAKE 1 TABLET BY MOUTH EVERYDAY AT BEDTIME  . traMADol (ULTRAM) 50 MG tablet TAKE 1 TABLET EVERY 4 TO 6 HOURS AS NEEDED FOR PAIN   No facility-administered encounter medications on file as of 11/18/2018.      Review of Systems  Constitutional: Negative.   HENT: Negative.   Eyes: Negative.   Respiratory: Negative.   Cardiovascular: Negative.  Gastrointestinal: Negative.   Endocrine: Negative.   Genitourinary: Negative.   Musculoskeletal: Positive for back pain (seen DR Alfonso Ramus following ).  Skin: Negative.   Allergic/Immunologic: Negative.   Neurological: Negative.   Hematological: Negative.   Psychiatric/Behavioral: Negative.        Objective:   Physical Exam Vitals signs and nursing note reviewed.  Constitutional:      Appearance: Normal appearance. She is well-developed and normal weight. She is not ill-appearing or diaphoretic.     Comments: Patient is pleasant and smiling and in no acute distress.  She comes to the visit with her daughter.   HENT:     Head: Normocephalic and atraumatic.     Right Ear: Tympanic membrane, ear canal and external ear normal. There is no impacted cerumen.     Left Ear: Tympanic membrane, ear canal and external ear normal. There is no impacted cerumen.     Nose: Nose normal. No congestion.     Mouth/Throat:     Mouth: Mucous membranes are moist.     Pharynx: Oropharynx is clear. No oropharyngeal exudate or posterior oropharyngeal erythema.  Eyes:     General: No scleral icterus.       Right eye: No discharge.        Left eye: No discharge.     Extraocular Movements: Extraocular movements intact.     Conjunctiva/sclera: Conjunctivae normal.     Pupils: Pupils are equal, round, and reactive to light.  Neck:     Musculoskeletal: Normal range of motion and neck supple.     Thyroid: No thyromegaly.     Vascular: No carotid bruit or JVD.     Comments: No bruits thyromegaly or anterior cervical adenopathy Cardiovascular:     Rate and Rhythm: Normal rate and regular rhythm.     Pulses: Normal pulses.     Heart sounds: Normal heart sounds. No murmur. No gallop.      Comments: The heart is regular at 72/min Pulmonary:     Effort: Pulmonary effort is normal.     Breath sounds: Normal breath sounds. No wheezing or rales.  Abdominal:     General: Abdomen is flat. Bowel sounds are normal.     Palpations: Abdomen is soft. There is no mass.     Tenderness: There is no abdominal tenderness. There is no guarding.  Musculoskeletal: Normal range of motion.        General: No tenderness.     Right lower leg: No edema.     Left lower leg: No edema.  Lymphadenopathy:     Cervical: No cervical adenopathy.  Skin:    General: Skin is warm and dry.     Findings: No rash.  Neurological:     General: No focal deficit present.     Mental Status: She is alert and oriented to person, place, and time.     Cranial Nerves: No cranial nerve deficit.     Gait: Gait normal.     Deep Tendon Reflexes: Reflexes are  normal and symmetric. Reflexes normal.  Psychiatric:        Mood and Affect: Mood normal.        Behavior: Behavior normal.        Thought Content: Thought content normal.        Judgment: Judgment normal.    BP 127/84 (BP Location: Left Arm)   Pulse 66   Temp 97.9 F (36.6 C) (Oral)   Ht 5' 8"  (1.727 m)  Wt 185 lb (83.9 kg)   BMI 28.13 kg/m         Assessment & Plan:  1. Pure hypercholesterolemia -Continue with current treatment and with as aggressive therapeutic lifestyle changes as possible including diet and exercise.  We will call with results of lab work when that is returned. - BMP8+EGFR - CBC with Differential/Platelet - Lipid panel - Hepatic function panel - DG Chest 2 View; Future  2. Vitamin D deficiency -Continue with vitamin D replacement pending results of lab work - CBC with Differential/Platelet - VITAMIN D 25 Hydroxy (Vit-D Deficiency, Fractures)  3. Bilateral low back pain with sciatica, sciatica laterality unspecified, unspecified chronicity -Follow-up with orthopedic specialist for injections as planned  4. Memory impairment -Follow-up with neuropsychologist for test results  5. Anxiety -Patient seems to be calmer today  Patient Instructions                       Medicare Annual Wellness Visit  [facility] and the medical providers at [facility] strive to bring you the best medical care.  In doing so we not only want to address your current medical conditions and concerns but also to detect new conditions early and prevent illness, disease and health-related problems.    Medicare offers a yearly Wellness Visit which allows our clinical staff to assess your need for preventative services including immunizations, lifestyle education, counseling to decrease risk of preventable diseases and screening for fall risk and other medical concerns.    This visit is provided free of charge (no copay) for all Medicare recipients. The  clinical pharmacists at [facility] have begun to conduct these Wellness Visits which will also include a thorough review of all your medications.    As you primary medical provider recommend that you make an appointment for your Annual Wellness Visit if you have not done so already this year.  You may set up this appointment before you leave today or you may call back ([phone]) and schedule an appointment.  Please make sure when you call that you mention that you are scheduling your Annual Wellness Visit with the clinical pharmacist so that the appointment may be made for the proper length of time.     Continue current medications. Continue good therapeutic lifestyle changes which include good diet and exercise. Fall precautions discussed with patient. If an FOBT was given today- please return it to our front desk. If you are over 89 years old - you may need Prevnar 95 or the adult Pneumonia vaccine.  **Flu shots are available--- please call and schedule a FLU-CLINIC appointment**  After your visit with [place] today you will receive a survey in the mail or online from [employer] regarding your care with [place]. Please take a moment to fill this out. Your feedback is very important to [place] as you can help [place] better understand your patient needs as well as improve your experience and satisfaction. WE CARE ABOUT YOU!!!   Follow-up with neuropsychologist as planned Follow-up with orthopedist as planned Do not forget that your next colonoscopy would be due in 2023.  Arrie Senate MD

## 2018-11-19 LAB — HEPATIC FUNCTION PANEL
ALT: 24 IU/L (ref 0–32)
AST: 22 IU/L (ref 0–40)
Albumin: 4.4 g/dL (ref 3.7–4.7)
Alkaline Phosphatase: 70 IU/L (ref 39–117)
Bilirubin Total: 0.5 mg/dL (ref 0.0–1.2)
Bilirubin, Direct: 0.16 mg/dL (ref 0.00–0.40)
Total Protein: 6.3 g/dL (ref 6.0–8.5)

## 2018-11-19 LAB — LIPID PANEL
Chol/HDL Ratio: 2 ratio (ref 0.0–4.4)
Cholesterol, Total: 172 mg/dL (ref 100–199)
HDL: 87 mg/dL (ref >39)
LDL Calculated: 75 mg/dL (ref 0–99)
Triglycerides: 50 mg/dL (ref 0–149)
VLDL Cholesterol Cal: 10 mg/dL (ref 5–40)

## 2018-11-19 LAB — BMP8+EGFR
BUN/Creatinine Ratio: 36 — ABNORMAL HIGH (ref 12–28)
BUN: 24 mg/dL (ref 8–27)
CO2: 24 mmol/L (ref 20–29)
Calcium: 10.1 mg/dL (ref 8.7–10.3)
Chloride: 103 mmol/L (ref 96–106)
Creatinine, Ser: 0.67 mg/dL (ref 0.57–1.00)
GFR calc Af Amer: 100 mL/min/{1.73_m2} (ref >59)
GFR calc non Af Amer: 87 mL/min/{1.73_m2} (ref >59)
Glucose: 111 mg/dL — ABNORMAL HIGH (ref 65–99)
Potassium: 4.4 mmol/L (ref 3.5–5.2)
Sodium: 142 mmol/L (ref 134–144)

## 2018-11-19 LAB — CBC WITH DIFFERENTIAL/PLATELET
Basophils Absolute: 0 10*3/uL (ref 0.0–0.2)
Basos: 1 %
EOS (ABSOLUTE): 0.2 10*3/uL (ref 0.0–0.4)
Eos: 5 %
Hematocrit: 40.2 % (ref 34.0–46.6)
Hemoglobin: 13.2 g/dL (ref 11.1–15.9)
Immature Grans (Abs): 0 10*3/uL (ref 0.0–0.1)
Immature Granulocytes: 0 %
Lymphocytes Absolute: 1.1 10*3/uL (ref 0.7–3.1)
Lymphs: 21 %
MCH: 31.2 pg (ref 26.6–33.0)
MCHC: 32.8 g/dL (ref 31.5–35.7)
MCV: 95 fL (ref 79–97)
Monocytes Absolute: 0.6 10*3/uL (ref 0.1–0.9)
Monocytes: 11 %
Neutrophils Absolute: 3.1 10*3/uL (ref 1.4–7.0)
Neutrophils: 62 %
Platelets: 259 10*3/uL (ref 150–450)
RBC: 4.23 x10E6/uL (ref 3.77–5.28)
RDW: 12.6 % (ref 11.7–15.4)
WBC: 4.9 10*3/uL (ref 3.4–10.8)

## 2018-11-19 LAB — VITAMIN D 25 HYDROXY (VIT D DEFICIENCY, FRACTURES): Vit D, 25-Hydroxy: 53.3 ng/mL (ref 30.0–100.0)

## 2018-11-20 DIAGNOSIS — M545 Low back pain: Secondary | ICD-10-CM | POA: Diagnosis not present

## 2018-11-23 ENCOUNTER — Encounter: Payer: Medicare Other | Admitting: Psychology

## 2018-12-01 ENCOUNTER — Encounter: Payer: Self-pay | Admitting: Cardiology

## 2018-12-01 DIAGNOSIS — F418 Other specified anxiety disorders: Secondary | ICD-10-CM | POA: Diagnosis not present

## 2018-12-01 DIAGNOSIS — R4189 Other symptoms and signs involving cognitive functions and awareness: Secondary | ICD-10-CM | POA: Diagnosis not present

## 2018-12-01 NOTE — Progress Notes (Signed)
Cardiology Office Note   Date:  12/03/2018   ID:  Kelly George, Kelly George 08-11-1946, MRN [id number]  PCP:  Chipper Herb, MD  Cardiologist:   No primary care provider on file. Referring:  Chipper Herb, MD   Chief Complaint  Patient presents with  . Chest Pain      History of Present Illness: Kelly George is a 73 y.o. female who is referred by Chipper Herb, MD for evaluation of dyslipidemia.  I saw her in 2016 for evaluation of chest pain and palpitations.  Echo was unremarkable.  She was going to have a treadmill test at that time but she has some ankle problems though she did not have this.  She has back because of noted atherosclerosis in her aorta on x-ray.  She does have some dyspnea with significant exertion.  She may sporadically get some chest discomfort as well.  She was working out routinely until November when she started having some back problems.  She denies any resting symptoms such as chest discomfort, neck or arm discomfort.  She has no shortness of breath, PND or orthopnea.  She is had no weight gain or edema.  Past Medical History:  Diagnosis Date  . Anxiety   . Depression   . Hyperlipidemia   . Seasonal allergies     Past Surgical History:  Procedure Laterality Date  . BREAST EXCISIONAL BIOPSY Left   . COLONOSCOPY  07/07/01  . SKIN SURGERY     DERM - nasal   . TUBAL LIGATION       Current Outpatient Medications  Medication Sig Dispense Refill  . calcium-vitamin D (OSCAL WITH D) 500-200 MG-UNIT per tablet Take 1 tablet by mouth daily.    . Cholecalciferol (VITAMIN D3) 2000 UNITS TABS Take 5,000 Units by mouth daily.     . Cyanocobalamin (VITAMIN B 12 PO) Take by mouth. 500 mg OTC one a day    . fish oil-omega-3 fatty acids 1000 MG capsule Take 1,200 mg by mouth daily.     . fluticasone (FLONASE) 50 MCG/ACT nasal spray SPRAY 2 SPRAYS INTO EACH NOSTRIL EVERY DAY 48 g 1  . glucosamine-chondroitin 500-400 MG tablet Take 1 tablet by mouth 2 (two)  times daily.     Marland Kitchen loratadine (CLARITIN) 10 MG tablet Take 10 mg by mouth daily.    . naproxen sodium (ANAPROX) 220 MG tablet Take 220 mg by mouth daily. TAKE TWO TABLETS ONCE DAILY    . sertraline (ZOLOFT) 25 MG tablet TAKE 1 TABLET BY MOUTH EVERY DAY 90 tablet 0  . simvastatin (ZOCOR) 10 MG tablet TAKE 1 TABLET BY MOUTH EVERYDAY AT BEDTIME 90 tablet 1  . traMADol (ULTRAM) 50 MG tablet TAKE 1 TABLET EVERY 4 TO 6 HOURS AS NEEDED FOR PAIN  0   No current facility-administered medications for this visit.     Allergies:   Penicillins and Tetracyclines & related    Social History:  The patient  reports that she has never smoked. She has never used smokeless tobacco. She reports current alcohol use of about 8.0 standard drinks of alcohol per week. She reports that she does not use drugs.   Family History:  The patient's family history includes Ankylosing spondylitis in her daughter; Autoimmune disease in her daughter; Breast cancer in her mother; COPD in her mother; Cancer in her father; Diabetes in her father; Healthy in her son; Heart attack (age of onset: 61) in her father; Heart disease  in her brother; High Cholesterol in her brother; Uterine cancer in her maternal aunt.    ROS:  Please see the history of present illness.   Otherwise, review of systems are positive for back problems, scoliosis.   All other systems are reviewed and negative.    PHYSICAL EXAM: VS:  BP 128/84 (BP Location: Left Arm)   Pulse 71   Ht 5\' 8"  (1.727 m)   Wt 185 lb (83.9 kg)   SpO2 99%   BMI 28.13 kg/m  , BMI Body mass index is 28.13 kg/m. GENERAL:  Well appearing HEENT:  Pupils equal round and reactive, fundi not visualized, oral mucosa unremarkable NECK:  No jugular venous distention, waveform within normal limits, carotid upstroke brisk and symmetric, no bruits, no thyromegaly LYMPHATICS:  No cervical, inguinal adenopathy LUNGS:  Clear to auscultation bilaterally BACK:  No CVA tenderness CHEST:   Unremarkable HEART:  PMI not displaced or sustained,S1 and S2 within normal limits, no S3, no S4, no clicks, no rubs, very brief apical only systolic murmur, no diastolic murmurs ABD:  Flat, positive bowel sounds normal in frequency in pitch, no bruits, no rebound, no guarding, no midline pulsatile mass, no hepatomegaly, no splenomegaly EXT:  2 plus pulses throughout, no edema, no cyanosis no clubbing SKIN:  No rashes no nodules NEURO:  Cranial nerves II through XII grossly intact, motor grossly intact throughout PSYCH:  Cognitively intact, oriented to person place and time    EKG:  EKG is ordered today. The ekg ordered today demonstrates sinus rhythm, rate 71, axis rightward,  right bundle branch block unchanged from previous.   Recent Labs: 03/11/2018: TSH 2.060 11/18/2018: ALT 24; BUN 24; Creatinine, Ser 0.67; Hemoglobin 13.2; Platelets 259; Potassium 4.4; Sodium 142    Lipid Panel    Component Value Date/Time   CHOL 172 11/18/2018 1113   TRIG 50 11/18/2018 1113   TRIG 54 02/19/2017 1021   HDL 87 11/18/2018 1113   HDL 84 02/19/2017 1021   CHOLHDL 2.0 11/18/2018 1113   LDLCALC 75 11/18/2018 1113   LDLCALC 90 05/10/2014 0917      Wt Readings from Last 3 Encounters:  12/03/18 185 lb (83.9 kg)  11/18/18 185 lb (83.9 kg)  11/03/18 184 lb (83.5 kg)      Other studies Reviewed: Additional studies/ records that were reviewed today include: Labs, old EKG. Review of the above records demonstrates:  Please see elsewhere in the note.     ASSESSMENT AND PLAN:  MVP:   She did not have this on an echocardiogram.  Is actually taken this off of her problem list.  DYSLIPIDEMIA: She has an excellent lipid profile with a very good HDL.  No change in therapy.  AORTIC ATHEROSCLEROSIS: She will have aggressive primary risk reduction and evaluation as below.  CHEST PAIN/SOB: She does have some chest discomfort mildly and some mild shortness of breath.  Given her risk factors and the  evidence of atherosclerosis elsewhere I will bring the patient back for a POET (Plain Old Exercise Test). This will allow me to screen for obstructive coronary disease, risk stratify and very importantly provide a prescription for exercise.  Current medicines are reviewed at length with the patient today.  The patient does not have concerns regarding medicines.  The following changes have been made:  no change  Labs/ tests ordered today include:   Orders Placed This Encounter  Procedures  . EXERCISE TOLERANCE TEST (ETT)     Disposition:   FU with  me as needed based on the results of the above.   Signed, Minus Breeding, MD  12/03/2018 12:00 PM    [place]

## 2018-12-02 ENCOUNTER — Telehealth: Payer: Self-pay | Admitting: Neurology

## 2018-12-02 NOTE — Telephone Encounter (Signed)
Patient said she had the testing at [facility] and got all good results that she was doing well for her age. She wanted to know if the 2/28 appt was needed. Please call her back. Thanks!

## 2018-12-03 ENCOUNTER — Ambulatory Visit (INDEPENDENT_AMBULATORY_CARE_PROVIDER_SITE_OTHER): Payer: Medicare Other | Admitting: Cardiology

## 2018-12-03 ENCOUNTER — Encounter: Payer: Self-pay | Admitting: Cardiology

## 2018-12-03 VITALS — BP 128/84 | HR 71 | Ht 68.0 in | Wt 185.0 lb

## 2018-12-03 DIAGNOSIS — I700 Atherosclerosis of aorta: Secondary | ICD-10-CM

## 2018-12-03 DIAGNOSIS — R079 Chest pain, unspecified: Secondary | ICD-10-CM

## 2018-12-03 DIAGNOSIS — R0600 Dyspnea, unspecified: Secondary | ICD-10-CM | POA: Diagnosis not present

## 2018-12-03 DIAGNOSIS — E785 Hyperlipidemia, unspecified: Secondary | ICD-10-CM | POA: Diagnosis not present

## 2018-12-03 NOTE — Telephone Encounter (Signed)
Left message for patient to call back to let her know we could cancel appt for FEB and sch her appt in Jan 2021

## 2018-12-03 NOTE — Patient Instructions (Signed)
Medication Instructions:  Continue current medications  If you need a refill on your cardiac medications before your next appointment, please call your pharmacy.  Labwork: None ordered   Testing/Procedures: Your physician has requested that you have an exercise tolerance test. For further information please visit [email or web address]. Please also follow instruction sheet, as given.  Follow-Up: . Your physician recommends that you schedule a follow-up appointment in: As Needed   At [facility], you and your health needs are our priority.  As part of our continuing mission to provide you with exceptional heart care, we have created designated Provider Care Teams.  These Care Teams include your primary Cardiologist (physician) and Advanced Practice Providers (APPs -  Physician Assistants and Nurse Practitioners) who all work together to provide you with the care you need, when you need it.  Thank you for choosing [facility] at [facility]!!

## 2018-12-03 NOTE — Telephone Encounter (Signed)
Since testing didn't reveal any dementia or other cognitive impairment, she does not need to follow up now.  She may make an appointment for next January for routine follow up.

## 2018-12-07 ENCOUNTER — Telehealth (INDEPENDENT_AMBULATORY_CARE_PROVIDER_SITE_OTHER): Payer: Medicare Other

## 2018-12-07 DIAGNOSIS — I341 Nonrheumatic mitral (valve) prolapse: Secondary | ICD-10-CM

## 2018-12-07 DIAGNOSIS — I700 Atherosclerosis of aorta: Secondary | ICD-10-CM

## 2018-12-07 DIAGNOSIS — R072 Precordial pain: Secondary | ICD-10-CM | POA: Diagnosis not present

## 2018-12-07 NOTE — Telephone Encounter (Signed)
ekg order placed.

## 2018-12-10 ENCOUNTER — Telehealth (HOSPITAL_COMMUNITY): Payer: Self-pay

## 2018-12-10 NOTE — Telephone Encounter (Signed)
Encounter complete. 

## 2018-12-11 ENCOUNTER — Ambulatory Visit: Payer: Medicare Other | Admitting: Neurology

## 2018-12-15 ENCOUNTER — Ambulatory Visit (HOSPITAL_COMMUNITY)
Admission: RE | Admit: 2018-12-15 | Discharge: 2018-12-15 | Disposition: A | Discharge status: Home / Self Care | Payer: Medicare Other | Source: Ambulatory Visit | Attending: Cardiology | Admitting: Cardiology

## 2018-12-15 DIAGNOSIS — R0600 Dyspnea, unspecified: Secondary | ICD-10-CM | POA: Diagnosis not present

## 2018-12-16 ENCOUNTER — Other Ambulatory Visit: Payer: Self-pay | Admitting: Neurology

## 2018-12-16 DIAGNOSIS — M48061 Spinal stenosis, lumbar region without neurogenic claudication: Secondary | ICD-10-CM | POA: Diagnosis not present

## 2018-12-16 LAB — EXERCISE TOLERANCE TEST
Estimated workload: 10.1 METS
Exercise duration (min): 9 min
Exercise duration (sec): 0 s
MPHR: 146 {beats}/min
Peak HR: 142 {beats}/min
Percent HR: 97 %
RPE: 17
Rest HR: 64 {beats}/min

## 2018-12-22 ENCOUNTER — Encounter: Payer: Medicare Other | Admitting: Psychology

## 2019-02-08 ENCOUNTER — Other Ambulatory Visit: Payer: Self-pay | Admitting: Family Medicine

## 2019-03-31 ENCOUNTER — Ambulatory Visit: Payer: Medicare Other | Admitting: Family Medicine

## 2019-04-15 ENCOUNTER — Ambulatory Visit: Payer: Medicare Other | Admitting: Family Medicine

## 2019-05-04 ENCOUNTER — Other Ambulatory Visit: Payer: Self-pay | Admitting: Family Medicine

## 2019-06-11 DIAGNOSIS — M25559 Pain in unspecified hip: Secondary | ICD-10-CM | POA: Diagnosis not present

## 2019-06-12 ENCOUNTER — Other Ambulatory Visit: Payer: Self-pay | Admitting: Neurology

## 2019-07-12 ENCOUNTER — Other Ambulatory Visit: Payer: Self-pay

## 2019-07-13 ENCOUNTER — Ambulatory Visit (INDEPENDENT_AMBULATORY_CARE_PROVIDER_SITE_OTHER): Payer: Medicare Other | Admitting: Family Medicine

## 2019-07-13 ENCOUNTER — Encounter: Payer: Self-pay | Admitting: Family Medicine

## 2019-07-13 VITALS — BP 134/80 | HR 67 | Temp 97.7°F | Resp 20 | Ht 68.0 in | Wt 188.0 lb

## 2019-07-13 DIAGNOSIS — E559 Vitamin D deficiency, unspecified: Secondary | ICD-10-CM | POA: Diagnosis not present

## 2019-07-13 DIAGNOSIS — Z23 Encounter for immunization: Secondary | ICD-10-CM | POA: Diagnosis not present

## 2019-07-13 DIAGNOSIS — E7800 Pure hypercholesterolemia, unspecified: Secondary | ICD-10-CM | POA: Diagnosis not present

## 2019-07-13 DIAGNOSIS — B078 Other viral warts: Secondary | ICD-10-CM

## 2019-07-13 DIAGNOSIS — F419 Anxiety disorder, unspecified: Secondary | ICD-10-CM

## 2019-07-13 DIAGNOSIS — F3342 Major depressive disorder, recurrent, in full remission: Secondary | ICD-10-CM

## 2019-07-13 DIAGNOSIS — B079 Viral wart, unspecified: Secondary | ICD-10-CM | POA: Insufficient documentation

## 2019-07-13 MED ORDER — SIMVASTATIN 10 MG PO TABS: ORAL_TABLET | ORAL | 2 refills | Status: DC

## 2019-07-13 NOTE — Progress Notes (Signed)
   Subjective:  Patient ID: Kelly George, female    DOB: 01/22/1946, 73 y.o.   MRN: [id number]  Patient Care Team: Rakes, Linda M, FNP as PCP - General (Family Medicine) Richardson, Kathy, MD (Obstetrics and Gynecology) Jaffe, Adam R, DO as Consulting Physician (Neurology)   Chief Complaint:  Medical Management of Chronic Issues (4 mo ([place])) and Hyperlipidemia   HPI: Kelly George is a 73 y.o. female presenting on 07/13/2019 for Medical Management of Chronic Issues (4 mo ([place])) and Hyperlipidemia   Kelly George is a 73 yo female who presents for a 4 month follow up for HLD, anxiety and depression. She reports that she is doing well. She feels like her anxiety is well controlled with her current medications and denies panic attacks, thoughts of hopelessness, or feeling down. She recently had an injection done by her orthopedic for her back and hip pain and reports improvement in pain.   She does have two warts on her right hand ring finger. She reports they have been there for a few months, are slightly red and tender. She denies spreading erythema, fever, or drainage.   Hyperlipidemia This is a chronic problem. The problem is controlled. She has no history of chronic renal disease, diabetes, hypothyroidism, liver disease or obesity. Pertinent negatives include no chest pain, focal sensory loss, focal weakness, leg pain, myalgias or shortness of breath. Current antihyperlipidemic treatment includes exercise, diet change and statins. There are no compliance problems.  Risk factors for coronary artery disease include dyslipidemia and post-menopausal.    Relevant past medical, surgical, family, and social history reviewed and updated as indicated.  Allergies and medications reviewed and updated. Date reviewed: Chart in Epic.   Past Medical History:  Diagnosis Date  . Anxiety   . Depression   . Hyperlipidemia   . Seasonal allergies     Past Surgical History:  Procedure  Laterality Date  . BREAST EXCISIONAL BIOPSY Left   . COLONOSCOPY  07/07/01  . SKIN SURGERY     DERM - nasal   . TUBAL LIGATION      Social History   Socioeconomic History  . Marital status: Divorced    Spouse name: Not on file  . Number of children: 2  . Years of education: Not on file  . Highest education level: Associate degree: academic program  Occupational History  . Occupation: Retired  Social Needs  . Financial resource strain: Not hard at all  . Food insecurity    Worry: Never true    Inability: Never true  . Transportation needs    Medical: No    Non-medical: No  Tobacco Use  . Smoking status: Never Smoker  . Smokeless tobacco: Never Used  Substance and Sexual Activity  . Alcohol use: Yes    Alcohol/week: 8.0 standard drinks    Types: 8 Glasses of wine per week  . Drug use: No  . Sexual activity: Not Currently  Lifestyle  . Physical activity    Days per week: 0 days    Minutes per session: 0 min  . Stress: Only a little  Relationships  . Social connections    Talks on phone: More than three times a week    Gets together: More than three times a week    Attends religious service: Never    Active member of club or organization: No    Attends meetings of clubs or organizations: Never    Relationship status: Divorced  . Intimate partner violence      Fear of current or ex partner: No    Emotionally abused: No    Physically abused: No    Forced sexual activity: No  Other Topics Concern  . Not on file  Social History Narrative   Live alone.     Patient is right-handed. She lives alone in a 1 story house, 3-4 steps to enter.     Outpatient Encounter Medications as of 07/13/2019  Medication Sig  . aspirin EC 81 MG tablet Take 81 mg by mouth daily.  . calcium-vitamin D (OSCAL WITH D) 500-200 MG-UNIT per tablet Take 1 tablet by mouth daily.  . Cholecalciferol (VITAMIN D3) 2000 UNITS TABS Take 5,000 Units by mouth daily.   . Cyanocobalamin (VITAMIN B 12 PO)  Take by mouth. 500 mg OTC one a day  . fish oil-omega-3 fatty acids 1000 MG capsule Take 1,200 mg by mouth daily.   . glucosamine-chondroitin 500-400 MG tablet Take 1 tablet by mouth 2 (two) times daily.   . naproxen sodium (ANAPROX) 220 MG tablet Take 220 mg by mouth daily. TAKE TWO TABLETS ONCE DAILY  . sertraline (ZOLOFT) 25 MG tablet TAKE 1 TABLET BY MOUTH EVERY DAY  . simvastatin (ZOCOR) 10 MG tablet TAKE 1 TABLET BY MOUTH EVERYDAY AT BEDTIME  . traMADol (ULTRAM) 50 MG tablet TAKE 1 TABLET EVERY 4 TO 6 HOURS AS NEEDED FOR PAIN  . [DISCONTINUED] simvastatin (ZOCOR) 10 MG tablet TAKE 1 TABLET BY MOUTH EVERYDAY AT BEDTIME  . fluticasone (FLONASE) 50 MCG/ACT nasal spray SPRAY 2 SPRAYS INTO EACH NOSTRIL EVERY DAY (Patient not taking: Reported on 07/13/2019)  . [DISCONTINUED] loratadine (CLARITIN) 10 MG tablet Take 10 mg by mouth daily.   No facility-administered encounter medications on file as of 07/13/2019.     Allergies  Allergen Reactions  . Penicillins Rash  . Tetracyclines & Related Palpitations    Review of Systems  Constitutional: Negative for activity change, appetite change, fatigue, fever and unexpected weight change.  HENT: Negative for sore throat and trouble swallowing.   Eyes: Negative for visual disturbance.  Respiratory: Negative for cough and shortness of breath.   Cardiovascular: Negative for chest pain, palpitations and leg swelling.  Gastrointestinal: Negative for abdominal pain, constipation, diarrhea, nausea and vomiting.  Endocrine: Negative for polydipsia, polyphagia and polyuria.  Genitourinary: Negative for decreased urine volume, difficulty urinating and dysuria.  Musculoskeletal: Negative for arthralgias and myalgias.  Skin: Negative for rash and wound.  Neurological: Negative for dizziness, tremors, focal weakness, syncope and numbness.  Psychiatric/Behavioral: Negative for agitation and dysphoric mood. The patient is not nervous/anxious.          Objective:  BP 134/80   Pulse 67   Temp 97.7 F (36.5 C)   Resp 20   Ht 5' 8" (1.727 m)   Wt 188 lb (85.3 kg)   SpO2 99%   BMI 28.59 kg/m    Wt Readings from Last 3 Encounters:  07/13/19 188 lb (85.3 kg)  12/03/18 185 lb (83.9 kg)  11/18/18 185 lb (83.9 kg)    Physical Exam Constitutional:      General: She is not in acute distress.    Appearance: Normal appearance. She is not ill-appearing or toxic-appearing.  HENT:     Head: Normocephalic and atraumatic.     Right Ear: Tympanic membrane, ear canal and external ear normal.     Left Ear: Tympanic membrane, ear canal and external ear normal.     Nose: Nose normal.     Mouth/Throat:       Mouth: Mucous membranes are moist.     Pharynx: Oropharynx is clear.  Eyes:     Extraocular Movements: Extraocular movements intact.     Pupils: Pupils are equal, round, and reactive to light.  Neck:     Musculoskeletal: Normal range of motion and neck supple. No muscular tenderness.     Vascular: No carotid bruit.  Cardiovascular:     Rate and Rhythm: Normal rate and regular rhythm.     Pulses: Normal pulses.     Heart sounds: Murmur present. Systolic murmur present. No diastolic murmur. No friction rub. No gallop.   Pulmonary:     Effort: Pulmonary effort is normal. No respiratory distress.     Breath sounds: Normal breath sounds.  Abdominal:     General: Bowel sounds are normal.     Palpations: Abdomen is soft.     Tenderness: There is no abdominal tenderness.  Musculoskeletal: Normal range of motion.     Right lower leg: No edema.     Left lower leg: No edema.  Skin:    General: Skin is warm and dry.     Comments: Kelly George shaped raised wart x2 to the distal interphalangeal joint or there right ring finer with central erythema and tenderness to palpation. Wart to lateral side has central opening with seeds present.   Neurological:     Mental Status: She is alert and oriented to person, place, and time. Mental status is at baseline.   Psychiatric:        Mood and Affect: Mood normal.        Behavior: Behavior normal.      Procedures  Cryotherapy performed of wart x2 on right hand, ring finger. Discussed risks and benefits with patients. Patient provided verbal consent. Name and DOB verified for patient identification. Three freeze-thaw cycles performed. Patient tolerated well. Bandage lightly applied following produce. Educational handout given.    Results for orders placed or performed during the hospital encounter of 12/15/18  EXERCISE TOLERANCE TEST (ETT)  Result Value Ref Range   Rest HR 64 bpm   Rest BP 148/86 mmHg   RPE 17    Exercise duration (sec) 0 sec   Percent HR 97 %   Exercise duration (min) 9 min   Estimated workload 10.1 METS   Peak HR 142 bpm   Peak BP 209/101 mmHg   MPHR 146 bpm       Pertinent labs & imaging results that were available during my care of the patient were reviewed by me and considered in my medical decision making.  Assessment & Plan:  Kelly George was seen today for medical management of chronic issues and hyperlipidemia.  Diagnoses and all orders for this visit:  Pure hypercholesterolemia Diet encouraged - increase intake of fresh fruits and vegetables, increase intake of lean proteins. Bake, broil, or grill foods. Avoid fried, greasy, and fatty foods. Avoid fast foods. Increase intake of fiber-rich whole grains. Exercise encouraged - at least 150 minutes per week and advance as tolerated.  Goal BMI < 25. Continue medications as prescribed. Follow up in 3-6 months as discussed.  -     CMP14+EGFR -     Lipid panel -     CBC with Differential/Platelet -     simvastatin (ZOCOR) 10 MG tablet; TAKE 1 TABLET BY MOUTH EVERYDAY AT BEDTIME  Vitamin D deficiency Labs pending. Continue repletion therapy. If indicated, will change repletion dosage. Eat foods rich in Vit D including milk, orange juice, yogurt with  vitamin D added, salmon or mackerel, canned tuna fish, cereals with  vitamin D added, and cod liver oil. Get out in the sun but make sure to wear at least SPF 30 sunscreen.  -     CMP14+EGFR -     CBC with Differential/Platelet -     VITAMIN D 25 Hydroxy (Vit-D Deficiency, Fractures)  Need for immunization against influenza -     Flu Vaccine QUAD High Dose(Fluad)  Anxiety Continue medications as prescribed. Follow up in 3-6 months as discussed. Notify provider if increasing anxiety symptoms or side effects of medications noted.   Recurrent major depressive disorder, in full remission (HCC) Continue medications as prescribed. Follow up in 3-6 months as discussed. Notify provider if increasing depression symptoms or side effects of medications noted.  Viral warts, other type Cryotherapy performed as above procedure note. Discussed follow up 3-4 weeks for possible second treatment.   Discussed adding aspirin 81 mg daily for preventative measures based on history of aortic atherosclerosis.     Continue all other maintenance medications.  Follow up plan: Return in about 3 weeks (around 08/03/2019), or if symptoms worsen or fail to improve, for repeat cryo.  Continue healthy lifestyle choices, including diet (rich in fruits, vegetables, and lean proteins, and low in salt and simple carbohydrates) and exercise (at least 30 minutes of moderate physical activity daily).  Educational handout given for health maintenance, cryotherapy.  The above assessment and management plan was discussed with the patient. The patient verbalized understanding of and has agreed to the management plan. Patient is aware to call the clinic if they develop any new symptoms or if symptoms persist or worsen. Patient is aware when to return to the clinic for a follow-up visit. Patient educated on when it is appropriate to go to the emergency department.   Tiffany Morgan, RN, FNP student [facility] [phone]  I personally was present during the history,  physical exam, and medical decision-making activities of this service and have verified that the service and findings are accurately documented in the nurse practitioner student's note.  Michelle Rakes, FNP-C [facility] [address] [place], [place] [address] [phone]   

## 2019-07-13 NOTE — Patient Instructions (Signed)
Cryosurgery for Skin Conditions, Care After These instructions give you information on caring for yourself after your procedure. Your doctor may also give you more specific instructions. Call your doctor if you have any problems or questions after your procedure. Follow these instructions at home: Caring for the treated area   Follow instructions from your doctor about how to take care of the treated area. Make sure you: ? Keep the area covered with a bandage (dressing) until it heals, or for as long as told by your doctor. ? Wash your hands with soap and water before you change your bandage. If you do not have soap and water, use hand sanitizer. ? Change your bandage as told by your doctor. ? Keep the bandage and the treated area clean and dry. If the bandage gets wet, change it right away. ? Clean the treated area with soap and water.  Check the treated area every day for signs of infection. Check for: ? More redness, swelling, or pain. ? More fluid or blood. ? Warmth. ? Pus or a bad smell. General instructions  Do not pick at your blister. Do not try to break it open. This can cause infection and scarring.  Do not put any medicine, cream, or lotion on the treated area unless told by your doctor.  Take over-the-counter and prescription medicines only as told by your doctor.  Keep all follow-up visits as told by your doctor. This is important. Contact a doctor if:  You have more redness, swelling, or pain around the treated area.  You have more fluid or blood coming from the treated area.  The treated area feels warm to the touch.  You have pus or a bad smell coming from the treated area.  Your blister gets large and painful. Get help right away if:  You have a fever and have redness spreading from the treated area. Summary  You should keep the treated area and your bandage clean and dry.  Check the treated area every day for signs of infection. Signs include fluid, pus,  warmth, or having more redness, swelling, or pain.  Do not pick at your blister. Do not try to break it open. This information is not intended to replace advice given to you by your health care provider. Make sure you discuss any questions you have with your health care provider. Document Released: 12/23/2011 Document Revised: 09/12/2017 Document Reviewed: 08/19/2016 Elsevier Patient Education  2020 [employer].

## 2019-07-14 LAB — CBC WITH DIFFERENTIAL/PLATELET
Basophils Absolute: 0.1 10*3/uL (ref 0.0–0.2)
Basos: 1 %
EOS (ABSOLUTE): 0.2 10*3/uL (ref 0.0–0.4)
Eos: 5 %
Hematocrit: 41.7 % (ref 34.0–46.6)
Hemoglobin: 14.1 g/dL (ref 11.1–15.9)
Immature Grans (Abs): 0 10*3/uL (ref 0.0–0.1)
Immature Granulocytes: 0 %
Lymphocytes Absolute: 1.1 10*3/uL (ref 0.7–3.1)
Lymphs: 28 %
MCH: 31.9 pg (ref 26.6–33.0)
MCHC: 33.8 g/dL (ref 31.5–35.7)
MCV: 94 fL (ref 79–97)
Monocytes Absolute: 0.5 10*3/uL (ref 0.1–0.9)
Monocytes: 13 %
Neutrophils Absolute: 2.2 10*3/uL (ref 1.4–7.0)
Neutrophils: 53 %
Platelets: 239 10*3/uL (ref 150–450)
RBC: 4.42 x10E6/uL (ref 3.77–5.28)
RDW: 12.4 % (ref 11.7–15.4)
WBC: 4.1 10*3/uL (ref 3.4–10.8)

## 2019-07-14 LAB — CMP14+EGFR
ALT: 33 IU/L — ABNORMAL HIGH (ref 0–32)
AST: 31 IU/L (ref 0–40)
Albumin/Globulin Ratio: 1.7 (ref 1.2–2.2)
Albumin: 4.4 g/dL (ref 3.7–4.7)
Alkaline Phosphatase: 81 IU/L (ref 39–117)
BUN/Creatinine Ratio: 36 — ABNORMAL HIGH (ref 12–28)
BUN: 26 mg/dL (ref 8–27)
Bilirubin Total: 0.7 mg/dL (ref 0.0–1.2)
CO2: 24 mmol/L (ref 20–29)
Calcium: 9.9 mg/dL (ref 8.7–10.3)
Chloride: 101 mmol/L (ref 96–106)
Creatinine, Ser: 0.73 mg/dL (ref 0.57–1.00)
GFR calc Af Amer: 93 mL/min/{1.73_m2} (ref >59)
GFR calc non Af Amer: 81 mL/min/{1.73_m2} (ref >59)
Globulin, Total: 2.6 g/dL (ref 1.5–4.5)
Glucose: 105 mg/dL — ABNORMAL HIGH (ref 65–99)
Potassium: 4.3 mmol/L (ref 3.5–5.2)
Sodium: 139 mmol/L (ref 134–144)
Total Protein: 7 g/dL (ref 6.0–8.5)

## 2019-07-14 LAB — VITAMIN D 25 HYDROXY (VIT D DEFICIENCY, FRACTURES): Vit D, 25-Hydroxy: 44.4 ng/mL (ref 30.0–100.0)

## 2019-07-14 LAB — LIPID PANEL
Chol/HDL Ratio: 2 ratio (ref 0.0–4.4)
Cholesterol, Total: 169 mg/dL (ref 100–199)
HDL: 85 mg/dL (ref >39)
LDL Chol Calc (NIH): 73 mg/dL (ref 0–99)
Triglycerides: 54 mg/dL (ref 0–149)
VLDL Cholesterol Cal: 11 mg/dL (ref 5–40)

## 2019-07-16 ENCOUNTER — Telehealth: Payer: Self-pay | Admitting: Family Medicine

## 2019-07-16 ENCOUNTER — Encounter: Payer: Self-pay | Admitting: Nurse Practitioner

## 2019-07-16 ENCOUNTER — Other Ambulatory Visit: Payer: Self-pay

## 2019-07-16 ENCOUNTER — Ambulatory Visit (INDEPENDENT_AMBULATORY_CARE_PROVIDER_SITE_OTHER): Payer: Medicare Other | Admitting: Nurse Practitioner

## 2019-07-16 VITALS — BP 125/79 | HR 70 | Temp 97.8°F | Resp 18 | Ht 68.0 in | Wt 188.0 lb

## 2019-07-16 DIAGNOSIS — B078 Other viral warts: Secondary | ICD-10-CM

## 2019-07-16 DIAGNOSIS — Z9889 Other specified postprocedural states: Secondary | ICD-10-CM

## 2019-07-16 MED ORDER — CEPHALEXIN 500 MG PO CAPS: 500.0000 mg | ORAL_CAPSULE | Freq: Three times a day (TID) | ORAL | 0 refills | Status: DC

## 2019-07-16 NOTE — Progress Notes (Signed)
   Subjective:    Patient ID: Kelly George, female    DOB: April 12, 1946, 73 y.o.   MRN: [id number]   Chief Complaint: Finger swollen   HPI Patient coem sin today c/o herright fourth finger had a wart frozen off of it and now it as a big blister on it and she wanted someone to look at it.   Review of Systems  Constitutional: Negative for activity change and appetite change.  HENT: Negative.   Eyes: Negative for pain.  Respiratory: Negative for shortness of breath.   Cardiovascular: Negative for chest pain, palpitations and leg swelling.  Gastrointestinal: Negative for abdominal pain.  Endocrine: Negative for polydipsia.  Genitourinary: Negative.   Skin: Negative for rash.  Neurological: Negative for dizziness, weakness and headaches.  Hematological: Does not bruise/bleed easily.  Psychiatric/Behavioral: Negative.   All other systems reviewed and are negative.      Objective:   Physical Exam Vitals signs and nursing note reviewed.  Constitutional:      Appearance: Normal appearance.  Cardiovascular:     Rate and Rhythm: Normal rate and regular rhythm.     Heart sounds: Normal heart sounds.  Pulmonary:     Effort: Pulmonary effort is normal.     Breath sounds: Normal breath sounds.  Skin:    General: Skin is warm.     Comments: 5cm raised vesicular lesion on right 4th finger  Neurological:     General: No focal deficit present.     Mental Status: She is alert and oriented to person, place, and time.    BP 125/79   Pulse 70   Temp 97.8 F (36.6 C) (Temporal)   Resp 18   Ht 5\' 8"  (1.727 m)   Wt 188 lb (85.3 kg)   SpO2 98%   BMI 28.59 kg/m   Lesion cleaned with betadine and popped with sterile needle- drained clear fluid- bandaid applied     Assessment & Plan:  Kelly George in today with chief complaint of Finger swollen   1. S/P cryotherapy of skin lesion Keep clean and dry Clean with antibacteral soap BID RTO prn  Mary-Margaret Hassell Done, FNP

## 2019-07-16 NOTE — Telephone Encounter (Signed)
What symptoms do you have? Seen Michelle 9-29 and froze a place off her knuckle and it's red and looks like it has blood in in it.  How long have you been sick? Started getting like that yesterday  Have you been seen for this problem? 9-29  If your provider decides to give you a prescription, which pharmacy would you like for it to be sent to? [facility] in [place]   Patient informed that this information will be sent to the clinical staff for review and that they should receive a follow up call.

## 2019-07-16 NOTE — Telephone Encounter (Signed)
Apt scheduled.  

## 2019-07-16 NOTE — Telephone Encounter (Signed)
Patient seen Kelly George 9/29 and had cryotherapy on her right ring finger for a wart.  Patient states that where the wart was it looks like there is blood in it and started draining but the drainage is clear with no blood in it.  States it is red around the area that the wart was.  Not hot to the touch.  Covering PCP- please advise and send back to pools

## 2019-07-16 NOTE — Telephone Encounter (Signed)
Please have her scheduled w/ same day for eval to rule out superimposed infection.

## 2019-08-11 ENCOUNTER — Ambulatory Visit: Payer: Medicare Other | Admitting: Family Medicine

## 2019-08-16 ENCOUNTER — Ambulatory Visit (INDEPENDENT_AMBULATORY_CARE_PROVIDER_SITE_OTHER): Payer: Medicare Other | Admitting: *Deleted

## 2019-08-16 DIAGNOSIS — Z0000 Encounter for general adult medical examination without abnormal findings: Secondary | ICD-10-CM | POA: Diagnosis not present

## 2019-08-16 NOTE — Progress Notes (Signed)
MEDICARE ANNUAL WELLNESS VISIT  08/16/2019  Telephone Visit Disclaimer This Medicare AWV was conducted by telephone due to national recommendations for restrictions regarding the COVID-19 Pandemic (e.g. social distancing).  I verified, using two identifiers, that I am speaking with Kelly George or their authorized healthcare agent. I discussed the limitations, risks, security, and privacy concerns of performing an evaluation and management service by telephone and the potential availability of an in-person appointment in the future. The patient expressed understanding and agreed to proceed.   Subjective:  Kelly George is a 73 y.o. female patient of Rakes, Connye Burkitt, FNP who had a Medicare Annual Wellness Visit today via telephone. Kelly George is Retired and lives alone with her Kelly George. she has 2 children. she reports that she is socially active and does interact with friends/family regularly. she is minimally physically active and enjoys reading, watching TV, tending to her horse and playing with her dog.  Patient Care Team: Baruch Gouty, FNP as PCP - General (Family Medicine) Paula Compton, MD (Obstetrics and Gynecology) Pieter Partridge, DO as Consulting Physician (Neurology)  Advanced Directives 08/16/2019 08/14/2018  Does Patient Have a Medical Advance Directive? No No  Would patient like information on creating a medical advance directive? No - Patient declined Yes (MAU/Ambulatory/Procedural Areas - Information given)    Hospital Utilization Over the Past 12 Months: # of hospitalizations or ER visits: 0 # of surgeries: 0  Review of Systems    Patient reports that her overall health is unchanged compared to last year.  History obtained from chart review  Patient Reported Readings (BP, Pulse, CBG, Weight, etc) none  Pain Assessment Pain : No/denies pain     Current Medications & Allergies (verified) Allergies as of 08/16/2019      Reactions   Penicillins Rash   Tetracyclines & Related Palpitations      Medication List       Accurate as of August 16, 2019  9:53 AM. If you have any questions, ask your nurse or doctor.        STOP taking these medications   cephALEXin 500 MG capsule Commonly known as: Keflex   VITAMIN B 12 PO     TAKE these medications   aspirin EC 81 MG tablet Take 81 mg by mouth daily.   calcium-vitamin D 500-200 MG-UNIT tablet Commonly known as: OSCAL WITH D Take 1 tablet by mouth daily.   fish oil-omega-3 fatty acids 1000 MG capsule Take 1,200 mg by mouth daily.   glucosamine-chondroitin 500-400 MG tablet Take 1 tablet by mouth 2 (two) times daily.   naproxen sodium 220 MG tablet Commonly known as: ALEVE Take 220 mg by mouth daily. TAKE TWO TABLETS ONCE DAILY   sertraline 25 MG tablet Commonly known as: ZOLOFT TAKE 1 TABLET BY MOUTH EVERY DAY   simvastatin 10 MG tablet Commonly known as: ZOCOR TAKE 1 TABLET BY MOUTH EVERYDAY AT BEDTIME   Vitamin D3 50 MCG (2000 UT) Tabs Take 5,000 Units by mouth daily.       History (reviewed): Past Medical History:  Diagnosis Date  . Anxiety   . Depression   . Hyperlipidemia   . Seasonal allergies    Past Surgical History:  Procedure Laterality Date  . BREAST EXCISIONAL BIOPSY Left   . COLONOSCOPY  07/07/01  . SKIN SURGERY     DERM - nasal   . TUBAL LIGATION     Family History  Problem Relation Age of Onset  .  Breast cancer Mother   . COPD Mother   . Diabetes Father   . Cancer Father        Brain  . Heart attack Father 44  . Uterine cancer Maternal Aunt   . Heart disease Brother   . High Cholesterol Brother   . Autoimmune disease Daughter   . Ankylosing spondylitis Daughter   . Healthy Son    Social History   Socioeconomic History  . Marital status: Divorced    Spouse name: Not on file  . Number of children: 2  . Years of education: Not on file  . Highest education level: Associate degree: academic program   Occupational History  . Occupation: Retired  Scientific laboratory technician  . Financial resource strain: Not hard at all  . Food insecurity    Worry: Never true    Inability: Never true  . Transportation needs    Medical: No    Non-medical: No  Tobacco Use  . Smoking status: Never Smoker  . Smokeless tobacco: Never Used  Substance and Sexual Activity  . Alcohol use: Yes    Alcohol/week: 22.0 standard drinks    Types: 8 Glasses of wine, 14 Cans of beer per week  . Drug use: No  . Sexual activity: Not Currently  Lifestyle  . Physical activity    Days per week: 0 days    Minutes per session: 0 min  . Stress: Not at all  Relationships  . Social connections    Talks on phone: More than three times a week    Gets together: More than three times a week    Attends religious service: Never    Active member of club or organization: No    Attends meetings of clubs or organizations: Never    Relationship status: Divorced  Other Topics Concern  . Not on file  Social History Narrative   Live alone.     Patient is right-handed. She lives alone in a 1 story house, 3-4 steps to enter.     Activities of Daily Living In your present state of health, do you have any difficulty performing the following activities: 08/16/2019  Hearing? N  Vision? N  Comment wears glasses-last eye exam 2 years ago  Difficulty concentrating or making decisions? Y  Comment her memory isn't as good as what it used to be-has seen Neurology for this  Walking or climbing stairs? Y  Comment knees and back bothers her when she uses the stairs-doesn't have to use a cane/walker  Dressing or bathing? N  Doing errands, shopping? N  Preparing Food and eating ? N  Using the Toilet? N  In the past six months, have you accidently leaked urine? Y  Comment wears pads all the time  Do you have problems with loss of bowel control? N  Managing your Medications? N  Managing your Finances? N  Housekeeping or managing your Housekeeping? N   Some recent data might be hidden    Patient Education/ Literacy How often do you need to have someone help you when you read instructions, pamphlets, or other written materials from your doctor or pharmacy?: 1 - Never What is the last grade level you completed in school?: Associates Degree  Exercise Current Exercise Habits: The patient does not participate in regular exercise at present, Exercise limited by: neurologic condition(s)  Diet Patient reports consuming 3 meals a day and 1 snack(s) a day Patient reports that her primary diet is: Regular Patient reports that she does  have regular access to food.   Depression Screen PHQ 2/9 Scores 08/16/2019 07/16/2019 07/13/2019 11/18/2018 08/14/2018 07/16/2018 05/25/2018  PHQ - 2 Score 0 0 0 2 2 0 2  PHQ- 9 Score - - - 7 2 - 6     Fall Risk Fall Risk  08/16/2019 07/16/2019 07/13/2019 11/18/2018 11/03/2018  Falls in the past year? 0 0 0 1 0  Number falls in past yr: - - - 0 -  Injury with Fall? - - - 0 -  Risk Factor Category  - - - - -  Risk for fall due to : - - - - -  Risk for fall due to: Comment - - - - -  Follow up - - - - Falls evaluation completed     Objective:  Kelly George seemed alert and oriented and she participated appropriately during our telephone visit.  Blood Pressure Weight BMI  BP Readings from Last 3 Encounters:  07/16/19 125/79  07/13/19 134/80  12/03/18 128/84   Wt Readings from Last 3 Encounters:  07/16/19 188 lb (85.3 kg)  07/13/19 188 lb (85.3 kg)  12/03/18 185 lb (83.9 kg)   BMI Readings from Last 1 Encounters:  07/16/19 28.59 kg/m    *Unable to obtain current vital signs, weight, and BMI due to telephone visit type  Hearing/Vision  . Mazie did not seem to have difficulty with hearing/understanding during the telephone conversation . Reports that she has not had a formal eye exam by an eye care professional within the past year . Reports that she has not had a formal hearing evaluation within the  past year *Unable to fully assess hearing and vision during telephone visit type  Cognitive Function: 6CIT Screen 08/16/2019  What Year? 0 points  What month? 0 points  What time? 0 points  Count back from 20 0 points  Months in reverse 0 points  Repeat phrase 0 points  Total Score 0   (Normal:0-7, Significant for Dysfunction: >8)  Normal Cognitive Function Screening: Yes   Immunization & Health Maintenance Record Immunization History  Administered Date(s) Administered  . Fluad Quad(high Dose 65+) 07/13/2019  . Influenza Whole 08/28/2012  . Influenza, High Dose Seasonal PF 08/18/2015, 09/30/2016, 08/04/2017, 07/16/2018  . Influenza,inj,Quad PF,6+ Mos 07/08/2013, 08/10/2014  . Pneumococcal Conjugate-13 01/05/2014  . Pneumococcal Polysaccharide-23 04/13/2010  . Tdap 08/06/2011  . Zoster 01/13/2007    Health Maintenance  Topic Date Due  . Hepatitis C Screening  07/11/2020 (Originally 10-Mar-1946)  . TETANUS/TDAP  08/05/2021  . COLONOSCOPY  07/06/2022  . INFLUENZA VACCINE  Completed  . DEXA SCAN  Completed  . PNA vac Low Risk Adult  Completed       Assessment  This is a routine wellness examination for Kelly George.  Health Maintenance: Due or Overdue There are no preventive care reminders to display for this patient.  Kelly George does not need a referral for Community Assistance: Care Management:   no Social Work:    no Prescription Assistance:  no Nutrition/Diabetes Education:  no   Plan:  Personalized Goals Goals Addressed            This Visit's Progress   . DIET - INCREASE WATER INTAKE       Try to drink 6-8 glasses of water daily      Personalized Health Maintenance & Screening Recommendations  She has had all recommended Health Maintenance Screenings  Lung Cancer Screening Recommended: no (Low Dose CT Chest recommended if  Age 49-80 years, 30 pack-year currently smoking OR have quit w/in past 15 years) Hepatitis C Screening recommended:  no HIV Screening recommended: no  Advanced Directives: Written information was not prepared per patient's request.  Referrals & Orders No orders of the defined types were placed in this encounter.   Follow-up Plan . Follow-up with Baruch Gouty, FNP as planned   I have personally reviewed and noted the following in the patient's chart:   . Medical and social history . Use of alcohol, tobacco or illicit drugs  . Current medications and supplements . Functional ability and status . Nutritional status . Physical activity . Advanced directives . List of other physicians . Hospitalizations, surgeries, and ER visits in previous 12 months . Vitals . Screenings to include cognitive, depression, and falls . Referrals and appointments  In addition, I have reviewed and discussed with Kelly George certain preventive protocols, quality metrics, and best practice recommendations. A written personalized care plan for preventive services as well as general preventive health recommendations is available and can be mailed to the patient at her request.      Milas Hock, LPN  [id number]

## 2019-08-16 NOTE — Patient Instructions (Signed)
Preventive Care 38 Years and Older, Female Preventive care refers to lifestyle choices and visits with your health care provider that can promote health and wellness. This includes:  A yearly physical exam. This is also called an annual well check.  Regular dental and eye exams.  Immunizations.  Screening for certain conditions.  Healthy lifestyle choices, such as diet and exercise. What can I expect for my preventive care visit? Physical exam Your health care provider will check:  Height and weight. These may be used to calculate body mass index (BMI), which is a measurement that tells if you are at a healthy weight.  Heart rate and blood pressure.  Your skin for abnormal spots. Counseling Your health care provider may ask you questions about:  Alcohol, tobacco, and drug use.  Emotional well-being.  Home and relationship well-being.  Sexual activity.  Eating habits.  History of falls.  Memory and ability to understand (cognition).  Work and work Statistician.  Pregnancy and menstrual history. What immunizations do I need?  Influenza (flu) vaccine  This is recommended every year. Tetanus, diphtheria, and pertussis (Tdap) vaccine  You may need a Td booster every 10 years. Varicella (chickenpox) vaccine  You may need this vaccine if you have not already been vaccinated. Zoster (shingles) vaccine  You may need this after age 33. Pneumococcal conjugate (PCV13) vaccine  One dose is recommended after age 33. Pneumococcal polysaccharide (PPSV23) vaccine  One dose is recommended after age 72. Measles, mumps, and rubella (MMR) vaccine  You may need at least one dose of MMR if you were born in 1957 or later. You may also need a second dose. Meningococcal conjugate (MenACWY) vaccine  You may need this if you have certain conditions. Hepatitis A vaccine  You may need this if you have certain conditions or if you travel or work in places where you may be exposed  to hepatitis A. Hepatitis B vaccine  You may need this if you have certain conditions or if you travel or work in places where you may be exposed to hepatitis B. Haemophilus influenzae type b (Hib) vaccine  You may need this if you have certain conditions. You may receive vaccines as individual doses or as more than one vaccine together in one shot (combination vaccines). Talk with your health care provider about the risks and benefits of combination vaccines. What tests do I need? Blood tests  Lipid and cholesterol levels. These may be checked every 5 years, or more frequently depending on your overall health.  Hepatitis C test.  Hepatitis B test. Screening  Lung cancer screening. You may have this screening every year starting at age 39 if you have a 30-pack-year history of smoking and currently smoke or have quit within the past 15 years.  Colorectal cancer screening. All adults should have this screening starting at age 36 and continuing until age 15. Your health care provider may recommend screening at age 23 if you are at increased risk. You will have tests every 1-10 years, depending on your results and the type of screening test.  Diabetes screening. This is done by checking your blood sugar (glucose) after you have not eaten for a while (fasting). You may have this done every 1-3 years.  Mammogram. This may be done every 1-2 years. Talk with your health care provider about how often you should have regular mammograms.  BRCA-related cancer screening. This may be done if you have a family history of breast, ovarian, tubal, or peritoneal cancers.  Other tests  Sexually transmitted disease (STD) testing.  Bone density scan. This is done to screen for osteoporosis. You may have this done starting at age 76. Follow these instructions at home: Eating and drinking  Eat a diet that includes fresh fruits and vegetables, whole grains, lean protein, and low-fat dairy products. Limit  your intake of foods with high amounts of sugar, saturated fats, and salt.  Take vitamin and mineral supplements as recommended by your health care provider.  Do not drink alcohol if your health care provider tells you not to drink.  If you drink alcohol: ? Limit how much you have to 0-1 drink a day. ? Be aware of how much alcohol is in your drink. In the [place] one drink equals one 12 oz bottle of beer (355 mL), one 5 oz glass of wine (148 mL), or one 1 oz glass of hard liquor (44 mL). Lifestyle  Take daily care of your teeth and gums.  Stay active. Exercise for at least 30 minutes on 5 or more days each week.  Do not use any products that contain nicotine or tobacco, such as cigarettes, e-cigarettes, and chewing tobacco. If you need help quitting, ask your health care provider.  If you are sexually active, practice safe sex. Use a condom or other form of protection in order to prevent STIs (sexually transmitted infections).  Talk with your health care provider about taking a low-dose aspirin or statin. What's next?  Go to your health care provider once a year for a well check visit.  Ask your health care provider how often you should have your eyes and teeth checked.  Stay up to date on all vaccines. This information is not intended to replace advice given to you by your health care provider. Make sure you discuss any questions you have with your health care provider. Document Released: 10/27/2015 Document Revised: 09/24/2018 Document Reviewed: 09/24/2018 Elsevier Patient Education  2020 [employer].

## 2019-09-17 ENCOUNTER — Other Ambulatory Visit: Payer: Self-pay

## 2019-09-20 ENCOUNTER — Ambulatory Visit: Payer: Medicare Other | Admitting: Family Medicine

## 2019-10-18 ENCOUNTER — Other Ambulatory Visit: Payer: Self-pay

## 2019-10-18 ENCOUNTER — Ambulatory Visit: Payer: Medicare Other | Attending: Internal Medicine

## 2019-10-18 DIAGNOSIS — Z20822 Contact with and (suspected) exposure to covid-19: Secondary | ICD-10-CM | POA: Diagnosis not present

## 2019-10-19 LAB — NOVEL CORONAVIRUS, NAA: SARS-CoV-2, NAA: NOT DETECTED

## 2019-10-25 ENCOUNTER — Ambulatory Visit: Payer: Medicare Other | Admitting: Neurology

## 2019-11-11 ENCOUNTER — Ambulatory Visit: Payer: Medicare Other | Admitting: Family Medicine

## 2019-11-12 ENCOUNTER — Ambulatory Visit: Payer: Medicare Other | Admitting: Family Medicine

## 2019-11-17 ENCOUNTER — Other Ambulatory Visit: Payer: Self-pay

## 2019-11-18 ENCOUNTER — Encounter: Payer: Self-pay | Admitting: Family Medicine

## 2019-11-18 ENCOUNTER — Ambulatory Visit (INDEPENDENT_AMBULATORY_CARE_PROVIDER_SITE_OTHER): Payer: Medicare Other | Admitting: Family Medicine

## 2019-11-18 VITALS — BP 127/83 | HR 70 | Temp 98.4°F | Resp 20 | Ht 68.0 in | Wt 187.0 lb

## 2019-11-18 DIAGNOSIS — F419 Anxiety disorder, unspecified: Secondary | ICD-10-CM

## 2019-11-18 DIAGNOSIS — E7800 Pure hypercholesterolemia, unspecified: Secondary | ICD-10-CM | POA: Diagnosis not present

## 2019-11-18 DIAGNOSIS — I700 Atherosclerosis of aorta: Secondary | ICD-10-CM | POA: Diagnosis not present

## 2019-11-18 DIAGNOSIS — B078 Other viral warts: Secondary | ICD-10-CM

## 2019-11-18 DIAGNOSIS — F339 Major depressive disorder, recurrent, unspecified: Secondary | ICD-10-CM

## 2019-11-18 DIAGNOSIS — E559 Vitamin D deficiency, unspecified: Secondary | ICD-10-CM

## 2019-11-18 MED ORDER — SERTRALINE HCL 50 MG PO TABS: 50.0000 mg | ORAL_TABLET | Freq: Every day | ORAL | 1 refills | Status: DC

## 2019-11-18 NOTE — Progress Notes (Signed)
Subjective:  Patient ID: Kelly George, female    DOB: 09/17/1946, 74 y.o.   MRN: [id number]  Patient Care Team: Baruch Gouty, FNP as PCP - General (Family Medicine) Paula Compton, MD (Obstetrics and Gynecology) Pieter Partridge, DO as Consulting Physician (Neurology)   Chief Complaint:  Medical Management of Chronic Issues, Cough, Hyperlipidemia, and Depression   HPI: Kelly George is a 74 y.o. female presenting on 11/18/2019 for Medical Management of Chronic Issues, Cough, Hyperlipidemia, and Depression  1. Pure hypercholesterolemia Compliant with medications - Yes Current medications - Zocor Side effects from medications - No Diet - generally healthy Exercise - active daily, no regular exercise  Lab Results  Component Value Date   CHOL 169 07/13/2019   HDL 85 07/13/2019   LDLCALC 73 07/13/2019   TRIG 54 07/13/2019   CHOLHDL 2.0 07/13/2019     Family and personal medical history reviewed. Smoking and ETOH history reviewed.    2. Vitamin D deficiency Pt ia taking oral repletion therapy. Denies bone pain and tenderness, muscle weakness, fracture, and difficulty walking. Lab Results  Component Value Date   VD25OH 44.4 07/13/2019   VD25OH 53.3 11/18/2018   VD25OH 43.2 07/16/2018   Lab Results  Component Value Date   CALCIUM 9.9 07/13/2019      3. Aortic atherosclerosis (HCC) Pt is on ASA and statin therapy. Denies chest pain, shortness of breath, palpitations, dizziness, or syncope.   4. Depression, recurrent ([place]) 5. Anxiety Pt reports slightly worse anxiety and depression symptoms. States she has noticed more fatigue and lack of interest. States she has no motivation to do anything. No SI or HI.  Depression screen [facility] 2/9 11/18/2019 08/16/2019 07/16/2019 07/13/2019 11/18/2018  Decreased Interest 1 0 0 0 1  Down, Depressed, Hopeless 2 0 0 0 1  PHQ - 2 Score 3 0 0 0 2  Altered sleeping 3 - - - 3  Tired, decreased energy 2 - - - 2  Change in appetite 1 - -  - 0  Feeling bad or failure about yourself  2 - - - 0  Trouble concentrating 0 - - - 0  Moving slowly or fidgety/restless 0 - - - 0  Suicidal thoughts 0 - - - 0  PHQ-9 Score 11 - - - 7  Difficult doing work/chores Somewhat difficult - - - Somewhat difficult  Some recent data might be hidden   GAD 7 : Generalized Anxiety Score 11/18/2019 07/13/2019  Nervous, Anxious, on Edge 1 0  Control/stop worrying 3 0  Worry too much - different things 3 1  Trouble relaxing 0 0  Restless 0 0  Easily annoyed or irritable 0 0  Afraid - awful might happen 0 0  Total GAD 7 Score 7 1  Anxiety Difficulty Somewhat difficult -        Relevant past medical, surgical, family, and social history reviewed and updated as indicated.  Allergies and medications reviewed and updated. Date reviewed: Chart in Epic.   Past Medical History:  Diagnosis Date  . Anxiety   . Depression   . Hyperlipidemia   . Seasonal allergies     Past Surgical History:  Procedure Laterality Date  . BREAST EXCISIONAL BIOPSY Left   . COLONOSCOPY  07/07/01  . SKIN SURGERY     DERM - nasal   . TUBAL LIGATION      Social History   Socioeconomic History  . Marital status: Divorced    Spouse name:  Not on file  . Number of children: 2  . Years of education: Not on file  . Highest education level: Associate degree: academic program  Occupational History  . Occupation: Retired  Tobacco Use  . Smoking status: Never Smoker  . Smokeless tobacco: Never Used  Substance and Sexual Activity  . Alcohol use: Yes    Alcohol/week: 22.0 standard drinks    Types: 8 Glasses of wine, 14 Cans of beer per week  . Drug use: No  . Sexual activity: Not Currently  Other Topics Concern  . Not on file  Social History Narrative   Live alone.     Patient is right-handed. She lives alone in a 1 story house, 3-4 steps to enter.    Social Determinants of Health   Financial Resource Strain:   . Difficulty of Paying Living Expenses: Not on  file  Food Insecurity:   . Worried About Charity fundraiser in the Last Year: Not on file  . Ran Out of Food in the Last Year: Not on file  Transportation Needs:   . Lack of Transportation (Medical): Not on file  . Lack of Transportation (Non-Medical): Not on file  Physical Activity:   . Days of Exercise per Week: Not on file  . Minutes of Exercise per Session: Not on file  Stress: No Stress Concern Present  . Feeling of Stress : Not at all  Social Connections:   . Frequency of Communication with Friends and Family: Not on file  . Frequency of Social Gatherings with Friends and Family: Not on file  . Attends Religious Services: Not on file  . Active Member of Clubs or Organizations: Not on file  . Attends Archivist Meetings: Not on file  . Marital Status: Not on file  Intimate Partner Violence:   . Fear of Current or Ex-Partner: Not on file  . Emotionally Abused: Not on file  . Physically Abused: Not on file  . Sexually Abused: Not on file    Outpatient Encounter Medications as of 11/18/2019  Medication Sig  . aspirin EC 81 MG tablet Take 81 mg by mouth daily.  . calcium-vitamin D (OSCAL WITH D) 500-200 MG-UNIT per tablet Take 1 tablet by mouth daily.  . Cholecalciferol (VITAMIN D3) 2000 UNITS TABS Take 5,000 Units by mouth daily.   . fish oil-omega-3 fatty acids 1000 MG capsule Take 1,200 mg by mouth daily.   Marland Kitchen glucosamine-chondroitin 500-400 MG tablet Take 1 tablet by mouth 2 (two) times daily.   . naproxen sodium (ANAPROX) 220 MG tablet Take 220 mg by mouth daily. TAKE TWO TABLETS ONCE DAILY  . sertraline (ZOLOFT) 50 MG tablet Take 1 tablet (50 mg total) by mouth daily.  . simvastatin (ZOCOR) 10 MG tablet TAKE 1 TABLET BY MOUTH EVERYDAY AT BEDTIME  . [DISCONTINUED] sertraline (ZOLOFT) 25 MG tablet TAKE 1 TABLET BY MOUTH EVERY DAY   No facility-administered encounter medications on file as of 11/18/2019.    Allergies  Allergen Reactions  . Penicillins Rash  .  Tetracyclines & Related Palpitations    Review of Systems  Constitutional: Positive for activity change, appetite change and fever. Negative for chills, diaphoresis, fatigue and unexpected weight change.  HENT: Negative.   Eyes: Negative.  Negative for photophobia and visual disturbance.  Respiratory: Negative for cough, chest tightness and shortness of breath.   Cardiovascular: Negative for chest pain, palpitations and leg swelling.  Gastrointestinal: Negative for abdominal pain, blood in stool, constipation, diarrhea, nausea  and vomiting.  Endocrine: Negative.  Negative for cold intolerance, heat intolerance, polydipsia, polyphagia and polyuria.  Genitourinary: Negative for decreased urine volume, difficulty urinating, dysuria, frequency and urgency.  Musculoskeletal: Negative for arthralgias and myalgias.  Skin: Negative.   Allergic/Immunologic: Negative.   Neurological: Negative for dizziness, tremors, seizures, syncope, facial asymmetry, speech difficulty, weakness, light-headedness, numbness and headaches.  Hematological: Negative.   Psychiatric/Behavioral: Positive for decreased concentration, dysphoric mood and sleep disturbance. Negative for agitation, behavioral problems, confusion, hallucinations, self-injury and suicidal ideas. The patient is nervous/anxious. The patient is not hyperactive.   All other systems reviewed and are negative.       Objective:  BP 127/83   Pulse 70   Temp 98.4 F (36.9 C)   Resp 20   Ht 5' 8"  (1.727 m)   Wt 187 lb (84.8 kg)   SpO2 98%   BMI 28.43 kg/m    Wt Readings from Last 3 Encounters:  11/18/19 187 lb (84.8 kg)  07/16/19 188 lb (85.3 kg)  07/13/19 188 lb (85.3 kg)    Physical Exam Vitals and nursing note reviewed.  Constitutional:      General: She is not in acute distress.    Appearance: Normal appearance. She is well-developed, well-groomed and overweight. She is not ill-appearing, toxic-appearing or diaphoretic.  HENT:      Head: Normocephalic and atraumatic.     Jaw: There is normal jaw occlusion.     Right Ear: Hearing, tympanic membrane, ear canal and external ear normal.     Left Ear: Hearing, tympanic membrane, ear canal and external ear normal.     Nose: Nose normal.     Mouth/Throat:     Lips: Pink.     Mouth: Mucous membranes are moist.     Pharynx: Oropharynx is clear. Uvula midline.  Eyes:     General: Lids are normal.     Extraocular Movements: Extraocular movements intact.     Conjunctiva/sclera: Conjunctivae normal.     Pupils: Pupils are equal, round, and reactive to light.  Neck:     Thyroid: No thyroid mass, thyromegaly or thyroid tenderness.     Vascular: No carotid bruit or JVD.     Trachea: Trachea and phonation normal.  Cardiovascular:     Rate and Rhythm: Normal rate and regular rhythm.     Chest Wall: PMI is not displaced.     Pulses: Normal pulses.     Heart sounds: Murmur present. Systolic murmur present with a grade of 2/6. No friction rub. No gallop.   Pulmonary:     Effort: Pulmonary effort is normal. No respiratory distress.     Breath sounds: Normal breath sounds. No wheezing.  Abdominal:     General: Bowel sounds are normal. There is no distension or abdominal bruit.     Palpations: Abdomen is soft. There is no hepatomegaly or splenomegaly.     Tenderness: There is no abdominal tenderness. There is no right CVA tenderness or left CVA tenderness.     Hernia: No hernia is present.  Musculoskeletal:        General: Normal range of motion.     Cervical back: Normal range of motion and neck supple.     Right lower leg: No edema.     Left lower leg: No edema.  Lymphadenopathy:     Cervical: No cervical adenopathy.  Skin:    General: Skin is warm and dry.     Capillary Refill: Capillary refill takes less than 2  seconds.     Coloration: Skin is not cyanotic, jaundiced or pale.     Findings: No rash.     Comments: Verruca vulgaris to right ring finger  Neurological:      General: No focal deficit present.     Mental Status: She is alert and oriented to person, place, and time.     Cranial Nerves: Cranial nerves are intact. No cranial nerve deficit.     Sensory: Sensation is intact. No sensory deficit.     Motor: Motor function is intact. No weakness.     Coordination: Coordination is intact. Coordination normal.     Gait: Gait is intact. Gait normal.     Deep Tendon Reflexes: Reflexes are normal and symmetric. Reflexes normal.  Psychiatric:        Attention and Perception: Attention and perception normal.        Mood and Affect: Mood and affect normal.        Speech: Speech normal.        Behavior: Behavior normal. Behavior is cooperative.        Thought Content: Thought content normal. Thought content does not include homicidal or suicidal ideation. Thought content does not include homicidal or suicidal plan.        Cognition and Memory: Cognition and memory normal.        Judgment: Judgment normal.     Results for orders placed or performed in visit on 10/18/19  Novel Coronavirus, NAA ([facility])   Specimen: Nasopharyngeal(NP) swabs in vial transport medium   NASOPHARYNGE  TESTING  Result Value Ref Range   SARS-CoV-2, NAA Not Detected Not Detected       Pertinent labs & imaging results that were available during my care of the patient were reviewed by me and considered in my medical decision making.  Assessment & Plan:  Chauncey was seen today for medical management of chronic issues, cough, hyperlipidemia and depression.  Diagnoses and all orders for this visit:  Pure hypercholesterolemia Diet encouraged - increase intake of fresh fruits and vegetables, increase intake of lean proteins. Bake, broil, or grill foods. Avoid fried, greasy, and fatty foods. Avoid fast foods. Increase intake of fiber-rich whole grains. Exercise encouraged - at least 150 minutes per week and advance as tolerated.  Goal BMI < 25. Continue medications as prescribed.  Follow up in 3-6 months as discussed.  -     CBC with Differential/Platelet -     CMP14+EGFR -     Lipid panel  Vitamin D deficiency Labs pending. Continue repletion therapy. If indicated, will change repletion dosage. Eat foods rich in Vit D including milk, orange juice, yogurt with vitamin D added, salmon or mackerel, canned tuna fish, cereals with vitamin D added, and cod liver oil. Get out in the sun but make sure to wear at least SPF 30 sunscreen.   Aortic atherosclerosis (HCC) On ASA and statin therapy. Labs pending. Diet and exercise encouraged.  -     CBC with Differential/Platelet -     Lipid panel  Depression, recurrent (HCC) Anxiety Pt with increased depression and anxiety symptoms. Will increase sertraline today. No SI or HI. Will check thyroid function today. Pt aware to report any new, worsening, or persistent symptoms.  -     sertraline (ZOLOFT) 50 MG tablet; Take 1 tablet (50 mg total) by mouth daily. -     Thyroid Panel With TSH  Viral warts Previous cryo therapy failed. Will refer to derm for treatment.  Continue all other maintenance medications.  Follow up plan: Return in about 6 months (around 05/17/2020), or if symptoms worsen or fail to improve.  Continue healthy lifestyle choices, including diet (rich in fruits, vegetables, and lean proteins, and low in salt and simple carbohydrates) and exercise (at least 30 minutes of moderate physical activity daily).  Educational handout given for depression   The above assessment and management plan was discussed with the patient. The patient verbalized understanding of and has agreed to the management plan. Patient is aware to call the clinic if they develop any new symptoms or if symptoms persist or worsen. Patient is aware when to return to the clinic for a follow-up visit. Patient educated on when it is appropriate to go to the emergency department.   Monia Pouch, FNP-C [facility] [phone]

## 2019-11-18 NOTE — Patient Instructions (Signed)
If your symptoms worsen or you have thoughts of suicide/homicide, PLEASE SEEK IMMEDIATE MEDICAL ATTENTION.  You may always call the National Suicide Hotline.  This is available 24 hours a day, 7 days a week.  Their number is: 1-800-273-8255  Taking the medicine as directed and not missing any doses is one of the best things you can do to treat your depression.  Here are some things to keep in mind:  1) Side effects (stomach upset, some increased anxiety) may happen before you notice a benefit.  These side effects typically go away over time. 2) Changes to your dose of medicine or a change in medication all together is sometimes necessary 3) Most people need to be on medication at least 12 months 4) Many people will notice an improvement within two weeks but the full effect of the medication can take up to 4-6 weeks 5) Stopping the medication when you start feeling better often results in a return of symptoms 6) Never discontinue your medication without contacting a health care professional first.  Some medications require gradual discontinuation/ taper and can make you sick if you stop them abruptly.  If your symptoms worsen or you have thoughts of suicide/homicide, PLEASE SEEK IMMEDIATE MEDICAL ATTENTION.  You may always call:  National Suicide Hotline: 800-273-8255 [place] Crisis Line: 336-832-9700 Crisis Recovery in [place]: 800-939-5911   These are available 24 hours a day, 7 days a week.   

## 2019-11-19 LAB — CBC WITH DIFFERENTIAL/PLATELET
Basophils Absolute: 0 10*3/uL (ref 0.0–0.2)
Basos: 1 %
EOS (ABSOLUTE): 0.1 10*3/uL (ref 0.0–0.4)
Eos: 4 %
Hematocrit: 43.4 % (ref 34.0–46.6)
Hemoglobin: 14.6 g/dL (ref 11.1–15.9)
Immature Grans (Abs): 0 10*3/uL (ref 0.0–0.1)
Immature Granulocytes: 0 %
Lymphocytes Absolute: 1.2 10*3/uL (ref 0.7–3.1)
Lymphs: 31 %
MCH: 31.9 pg (ref 26.6–33.0)
MCHC: 33.6 g/dL (ref 31.5–35.7)
MCV: 95 fL (ref 79–97)
Monocytes Absolute: 0.5 10*3/uL (ref 0.1–0.9)
Monocytes: 13 %
Neutrophils Absolute: 2.1 10*3/uL (ref 1.4–7.0)
Neutrophils: 51 %
Platelets: 262 10*3/uL (ref 150–450)
RBC: 4.58 x10E6/uL (ref 3.77–5.28)
RDW: 12.2 % (ref 11.7–15.4)
WBC: 4 10*3/uL (ref 3.4–10.8)

## 2019-11-19 LAB — CMP14+EGFR
ALT: 24 IU/L (ref 0–32)
AST: 21 IU/L (ref 0–40)
Albumin/Globulin Ratio: 1.8 (ref 1.2–2.2)
Albumin: 4.5 g/dL (ref 3.7–4.7)
Alkaline Phosphatase: 76 IU/L (ref 39–117)
BUN/Creatinine Ratio: 27 (ref 12–28)
BUN: 18 mg/dL (ref 8–27)
Bilirubin Total: 0.7 mg/dL (ref 0.0–1.2)
CO2: 25 mmol/L (ref 20–29)
Calcium: 10 mg/dL (ref 8.7–10.3)
Chloride: 100 mmol/L (ref 96–106)
Creatinine, Ser: 0.66 mg/dL (ref 0.57–1.00)
GFR calc Af Amer: 100 mL/min/{1.73_m2} (ref >59)
GFR calc non Af Amer: 87 mL/min/{1.73_m2} (ref >59)
Globulin, Total: 2.5 g/dL (ref 1.5–4.5)
Glucose: 93 mg/dL (ref 65–99)
Potassium: 4.2 mmol/L (ref 3.5–5.2)
Sodium: 140 mmol/L (ref 134–144)
Total Protein: 7 g/dL (ref 6.0–8.5)

## 2019-11-19 LAB — THYROID PANEL WITH TSH
Free Thyroxine Index: 1.7 (ref 1.2–4.9)
T3 Uptake Ratio: 25 % (ref 24–39)
T4, Total: 6.6 ug/dL (ref 4.5–12.0)
TSH: 2.15 u[IU]/mL (ref 0.450–4.500)

## 2019-11-19 LAB — LIPID PANEL
Chol/HDL Ratio: 2.2 ratio (ref 0.0–4.4)
Cholesterol, Total: 179 mg/dL (ref 100–199)
HDL: 81 mg/dL (ref >39)
LDL Chol Calc (NIH): 86 mg/dL (ref 0–99)
Triglycerides: 63 mg/dL (ref 0–149)
VLDL Cholesterol Cal: 12 mg/dL (ref 5–40)

## 2019-11-30 DIAGNOSIS — L821 Other seborrheic keratosis: Secondary | ICD-10-CM | POA: Diagnosis not present

## 2019-11-30 DIAGNOSIS — D225 Melanocytic nevi of trunk: Secondary | ICD-10-CM | POA: Diagnosis not present

## 2019-11-30 DIAGNOSIS — D485 Neoplasm of uncertain behavior of skin: Secondary | ICD-10-CM | POA: Diagnosis not present

## 2019-11-30 DIAGNOSIS — Z85828 Personal history of other malignant neoplasm of skin: Secondary | ICD-10-CM | POA: Diagnosis not present

## 2019-11-30 DIAGNOSIS — M7138 Other bursal cyst, other site: Secondary | ICD-10-CM | POA: Diagnosis not present

## 2019-11-30 DIAGNOSIS — L570 Actinic keratosis: Secondary | ICD-10-CM | POA: Diagnosis not present

## 2019-11-30 DIAGNOSIS — C44519 Basal cell carcinoma of skin of other part of trunk: Secondary | ICD-10-CM | POA: Diagnosis not present

## 2019-11-30 DIAGNOSIS — L905 Scar conditions and fibrosis of skin: Secondary | ICD-10-CM | POA: Diagnosis not present

## 2019-11-30 DIAGNOSIS — D1801 Hemangioma of skin and subcutaneous tissue: Secondary | ICD-10-CM | POA: Diagnosis not present

## 2019-12-06 DIAGNOSIS — C44519 Basal cell carcinoma of skin of other part of trunk: Secondary | ICD-10-CM | POA: Diagnosis not present

## 2019-12-06 DIAGNOSIS — D485 Neoplasm of uncertain behavior of skin: Secondary | ICD-10-CM | POA: Diagnosis not present

## 2019-12-13 ENCOUNTER — Other Ambulatory Visit: Payer: Self-pay | Admitting: Neurology

## 2019-12-14 DIAGNOSIS — C44519 Basal cell carcinoma of skin of other part of trunk: Secondary | ICD-10-CM | POA: Diagnosis not present

## 2019-12-25 IMAGING — DX DG CHEST 2V
2 series · 2 of 2 positions shown · non-contrast
Comparison: PA and lateral chest 09/30/2016.

CLINICAL DATA: Hypercholesterolemia.

EXAM:
CHEST - 2 VIEW

[chest pa]
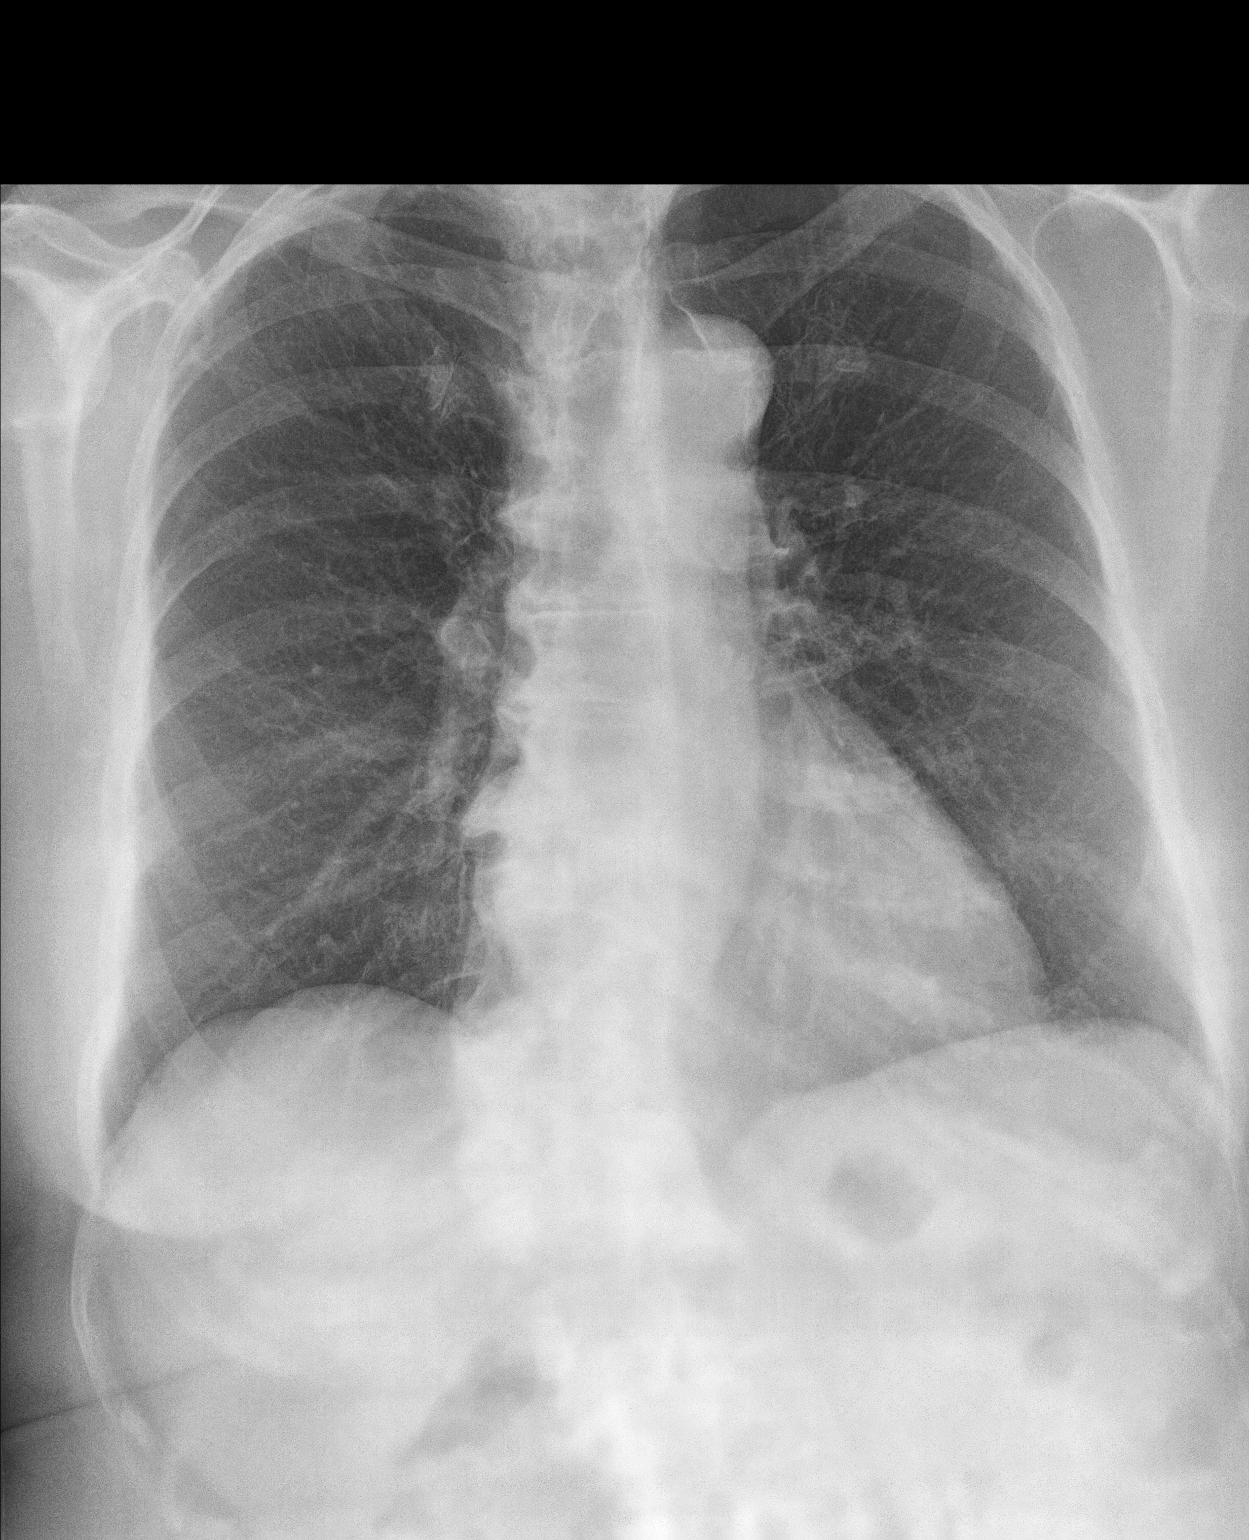

[chest lat]
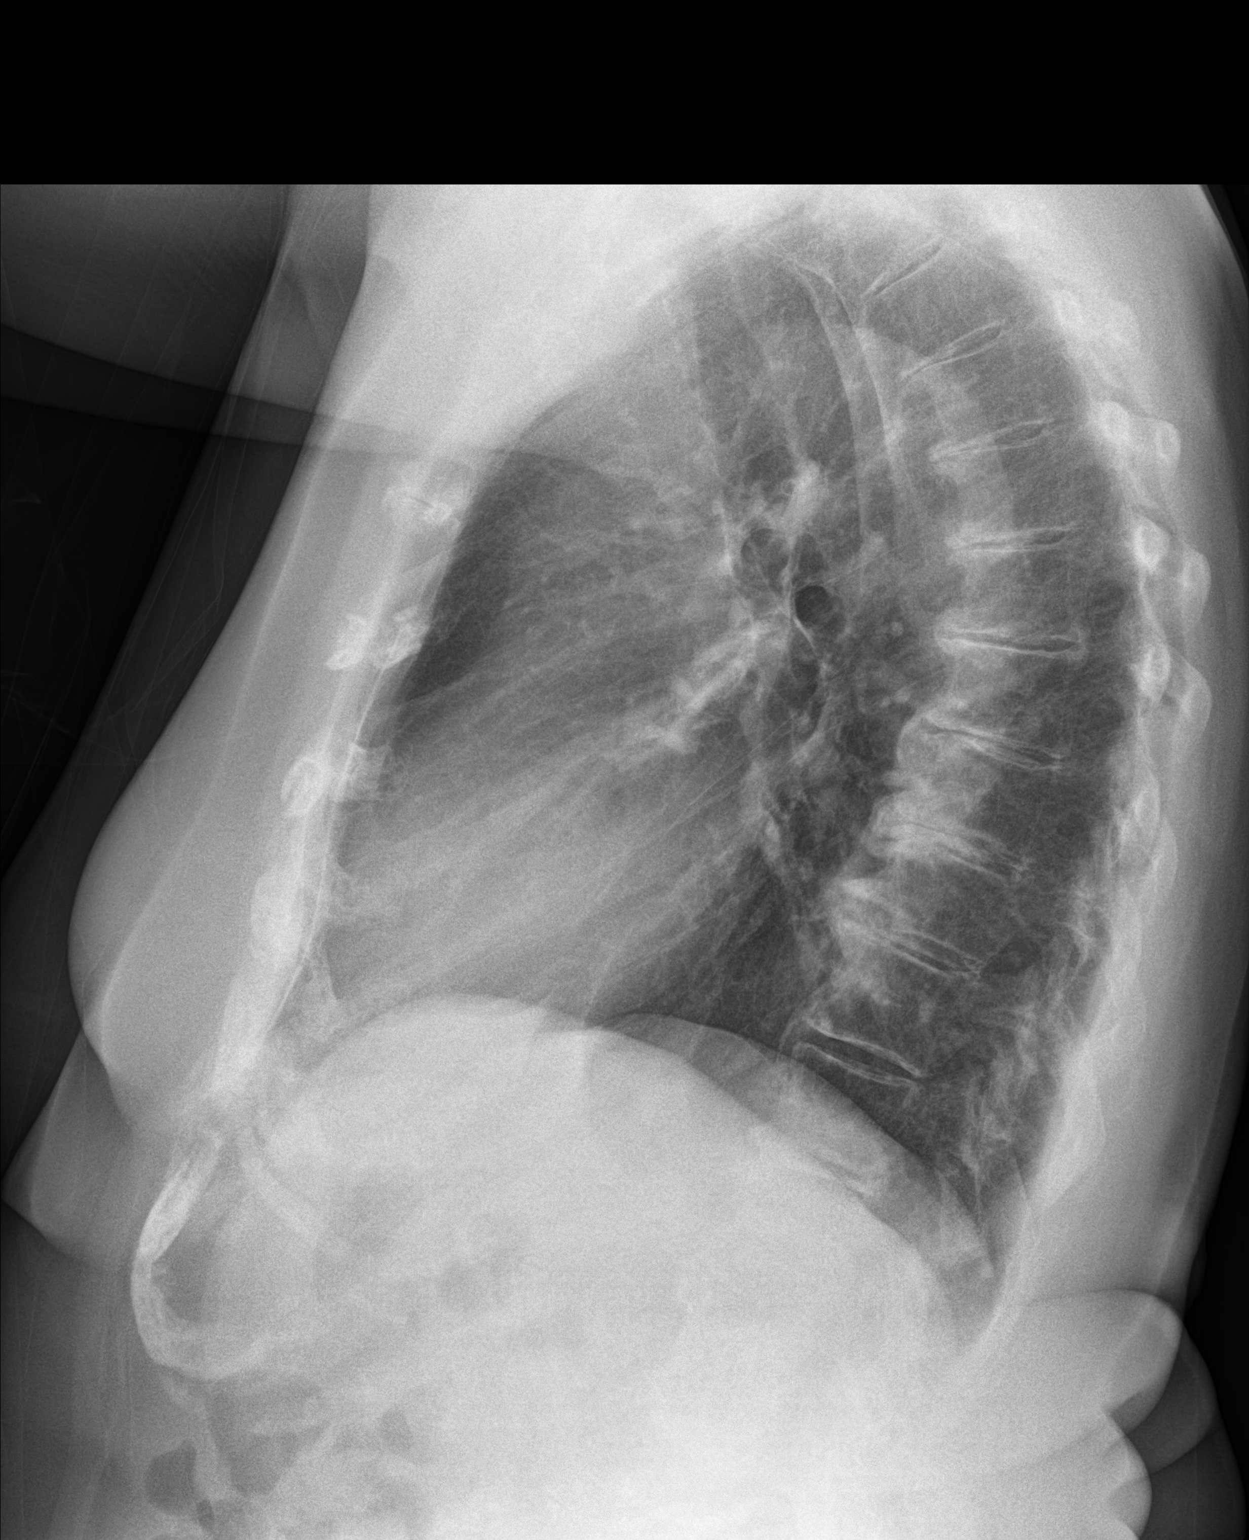

[2 of 2 positions shown; findings below may reference images not displayed]

FINDINGS: There is some apical scar, unchanged. Lungs otherwise clear. Heart
size is normal. Aortic atherosclerosis is noted. S shaped
thoracolumbar scoliosis is incompletely visualized. No acute or
focal bony abnormality.
IMPRESSION: No acute disease.

Atherosclerosis.

Scoliosis.

## 2020-01-04 ENCOUNTER — Telehealth: Payer: Self-pay | Admitting: Family Medicine

## 2020-01-04 NOTE — Chronic Care Management (AMB) (Signed)
  Chronic Care Management   Outreach Note  01/04/2020 Name: Kelly George MRN: [id number] DOB: Dec 06, 1945  Kelly George is a 74 y.o. year old female who is a primary care patient of Rakes, Connye Burkitt, FNP. I reached out to Thomasenia Sales by phone today in response to a referral sent by Ms. Baron Hamper Gehlhausen's health plan.     An unsuccessful telephone outreach was attempted today. The patient was referred to the case management team for assistance with care management and care coordination.   Follow Up Plan: A HIPPA compliant phone message was left for the patient providing contact information and requesting a return call.  The care management team will reach out to the patient again over the next 7 days. If patient returns call to provider office, please advise to call [facility] [place] at [phone].  [place], [place] [address] Direct Dial: [phone] [email or web address] Website: [email or web address]

## 2020-01-04 NOTE — Chronic Care Management (AMB) (Signed)
  Chronic Care Management   Note  01/04/2020 Name: Kelly George MRN: [id number] DOB: October 23, 1945  Kelly George is a 74 y.o. year old female who is a primary care patient of Rakes, Connye Burkitt, FNP. I reached out to Thomasenia Sales by phone today in response to a referral sent by Kelly George's health plan.     Ms. Haynie was given information about Chronic Care Management services today including:  1. CCM service includes personalized support from designated clinical staff supervised by her physician, including individualized plan of care and coordination with other care providers 2. 24/7 contact phone numbers for assistance for urgent and routine care needs. 3. Service will only be billed when office clinical staff spend 20 minutes or more in a month to coordinate care. 4. Only one practitioner may furnish and bill the service in a calendar month. 5. The patient may stop CCM services at any time (effective at the end of the month) by phone call to the office staff. 6. The patient will be responsible for cost sharing (co-pay) of up to 20% of the service fee (after annual deductible is met).  Patient agreed to services and verbal consent obtained.   Follow up plan: Telephone appointment with care management team member scheduled for: 06/21/2020.  [place], [place] [address] Direct Dial: [phone] [email or web address]: [email or web address]

## 2020-01-26 ENCOUNTER — Encounter: Payer: Self-pay | Admitting: *Deleted

## 2020-02-14 DIAGNOSIS — L905 Scar conditions and fibrosis of skin: Secondary | ICD-10-CM | POA: Diagnosis not present

## 2020-02-14 DIAGNOSIS — M71341 Other bursal cyst, right hand: Secondary | ICD-10-CM | POA: Diagnosis not present

## 2020-02-14 DIAGNOSIS — L910 Hypertrophic scar: Secondary | ICD-10-CM | POA: Diagnosis not present

## 2020-02-14 DIAGNOSIS — Z85828 Personal history of other malignant neoplasm of skin: Secondary | ICD-10-CM | POA: Diagnosis not present

## 2020-03-31 ENCOUNTER — Other Ambulatory Visit: Payer: Self-pay

## 2020-04-03 ENCOUNTER — Ambulatory Visit (INDEPENDENT_AMBULATORY_CARE_PROVIDER_SITE_OTHER): Payer: Medicare Other | Admitting: Family Medicine

## 2020-04-03 ENCOUNTER — Encounter: Payer: Self-pay | Admitting: Family Medicine

## 2020-04-03 ENCOUNTER — Other Ambulatory Visit: Payer: Self-pay

## 2020-04-03 VITALS — BP 126/78 | HR 82 | Temp 97.9°F | Ht 68.0 in | Wt 182.6 lb

## 2020-04-03 DIAGNOSIS — M159 Polyosteoarthritis, unspecified: Secondary | ICD-10-CM | POA: Insufficient documentation

## 2020-04-03 DIAGNOSIS — F339 Major depressive disorder, recurrent, unspecified: Secondary | ICD-10-CM | POA: Diagnosis not present

## 2020-04-03 DIAGNOSIS — E785 Hyperlipidemia, unspecified: Secondary | ICD-10-CM

## 2020-04-03 DIAGNOSIS — K219 Gastro-esophageal reflux disease without esophagitis: Secondary | ICD-10-CM | POA: Diagnosis not present

## 2020-04-03 NOTE — Progress Notes (Signed)
New Patient Office Visit  Subjective:  Patient ID: Kelly George, female    DOB: August 08, 1946  Age: 74 y.o. MRN: [id number]  CC:  Chief Complaint  Patient presents with  . New Patient (Initial Visit)    Pt has no other concerns     HPI Kelly George presents for establishing care.  She has previously been seen at [facility].  Generally doing well.  She has not been as active during the past year during the pandemic.  She had some mild weight gain.  Past medical history reviewed  -History of GERD which is generally controlled with over-the-counter medications.  No recent dysphagia symptoms. -She states she has been told she had a heart murmur in the past.  She had echocardiogram which was reviewed back in 2016 which showed no major valve issues or concerns. 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  Currently on sertraline 50 mg daily and stable -Hyperlipidemia treated with simvastatin 10 mg daily.  Had labs done 4 months ago including lipid panel and these were stable -She has osteoarthritis involving several joints and takes over-the-counter Tylenol for that  Surgical history -Benign breast biopsy 1983  Social history -Divorced after 71 years of marriage. -She has 1 son and 1 daughter. -Never smoked.  Drinks usually 2-3 beers per day.  Worked as a Economist.  Family history -Mother had breast cancer but died of COPD complications.  Father had some type of brain tumor but also type 2 diabetes, history of depression, history of coronary artery disease, hypertension, hyperlipidemia.  Brother with hyperlipidemia and hypertension history  Past Medical History:  Diagnosis Date  . Anxiety   . Cardiac arrhythmia due to congenital heart disease   . Depression   . Heart murmur   . Hyperlipidemia   . Seasonal allergies     Past Surgical History:  Procedure Laterality Date  . BREAST EXCISIONAL BIOPSY Left   . COLONOSCOPY  07/07/01  .  SKIN SURGERY     DERM - nasal   . TUBAL LIGATION      Family History  Problem Relation Age of Onset  . Breast cancer Mother   . COPD Mother   . Diabetes Father   . Cancer Father        Brain  . Heart attack Father 56  . Alcohol abuse Father   . Depression Father   . Uterine cancer Maternal Aunt   . Hearing loss Maternal Grandmother   . Hypertension Maternal Grandfather   . Kidney disease Maternal Grandfather   . Stroke Maternal Grandfather   . High Cholesterol Brother   . Hypertension Brother   . Hyperlipidemia Brother   . Autoimmune disease Daughter   . Ankylosing spondylitis Daughter   . Healthy Son   . Depression Son     Social History   Socioeconomic History  . Marital status: Divorced    Spouse name: Not on file  . Number of children: 2  . Years of education: Not on file  . Highest education level: Associate degree: academic program  Occupational History  . Occupation: Retired  Tobacco Use  . Smoking status: Never Smoker  . Smokeless tobacco: Never Used  Vaping Use  . Vaping Use: Never used  Substance and Sexual Activity  . Alcohol use: Yes    Alcohol/week: 22.0 standard drinks    Types: 8 Glasses of wine, 14 Cans of beer per week  . Drug use: No  . Sexual  activity: Not Currently  Other Topics Concern  . Not on file  Social History Narrative   Live alone.     Patient is right-handed. She lives alone in a 1 story house, 3-4 steps to enter.    Social Determinants of Health   Financial Resource Strain:   . Difficulty of Paying Living Expenses:   Food Insecurity:   . Worried About Charity fundraiser in the Last Year:   . Arboriculturist in the Last Year:   Transportation Needs:   . Film/video editor (Medical):   Marland Kitchen Lack of Transportation (Non-Medical):   Physical Activity:   . Days of Exercise per Week:   . Minutes of Exercise per Session:   Stress: No Stress Concern Present  . Feeling of Stress : Not at all  Social Connections:   .  Frequency of Communication with Friends and Family:   . Frequency of Social Gatherings with Friends and Family:   . Attends Religious Services:   . Active Member of Clubs or Organizations:   . Attends Archivist Meetings:   Marland Kitchen Marital Status:   Intimate Partner Violence:   . Fear of Current or Ex-Partner:   . Emotionally Abused:   Marland Kitchen Physically Abused:   . Sexually Abused:     ROS Review of Systems  Constitutional: Negative for chills, fever and unexpected weight change.  Respiratory: Negative for cough and shortness of breath.   Cardiovascular: Negative for chest pain.  Gastrointestinal: Negative for abdominal pain.  Genitourinary: Negative for dysuria.  Neurological: Negative for dizziness and headaches.  Psychiatric/Behavioral: Negative for dysphoric mood.    Objective:   Today's Vitals: BP 126/78 (BP Location: Left Arm, Patient Position: Sitting, Cuff Size: Normal)   Pulse 82   Temp 97.9 F (36.6 C) (Temporal)   Ht 5\' 8"  (1.727 m)   Wt 182 lb 9.6 oz (82.8 kg)   SpO2 96%   BMI 27.76 kg/m   Physical Exam Constitutional:      Appearance: She is well-developed.  Eyes:     Pupils: Pupils are equal, round, and reactive to light.  Neck:     Thyroid: No thyromegaly.     Vascular: No JVD.  Cardiovascular:     Rate and Rhythm: Normal rate and regular rhythm.     Heart sounds: No gallop.   Pulmonary:     Effort: Pulmonary effort is normal. No respiratory distress.     Breath sounds: Normal breath sounds. No wheezing or rales.  Musculoskeletal:     Cervical back: Neck supple.     Right lower leg: No edema.     Left lower leg: No edema.  Neurological:     Mental Status: She is alert.     Assessment & Plan:   #1 GERD currently stable with over-the-counter medications. -She is aware of dietary triggers.  Consider over-the-counter Pepcid 20 mg once or twice daily for future flares  #2 history of recurrent depression currently stable on sertraline  #3  osteoarthritis involving multiple joints  #4 hyperlipidemia treated with simvastatin.  Recent lipids were done 4 months ago and stable  #5 health maintenance -Patient will be scheduled for yearly wellness exam sometime around August  Outpatient Encounter Medications as of 04/03/2020  Medication Sig  . aspirin EC 81 MG tablet Take 81 mg by mouth daily.  . calcium-vitamin D (OSCAL WITH D) 500-200 MG-UNIT per tablet Take 1 tablet by mouth daily.  . Cholecalciferol (VITAMIN D3) 2000  UNITS TABS Take 5,000 Units by mouth daily.   . fish oil-omega-3 fatty acids 1000 MG capsule Take 1,200 mg by mouth daily.   Marland Kitchen glucosamine-chondroitin 500-400 MG tablet Take 1 tablet by mouth 2 (two) times daily.   . naproxen sodium (ANAPROX) 220 MG tablet Take 220 mg by mouth daily. TAKE TWO TABLETS ONCE DAILY  . sertraline (ZOLOFT) 50 MG tablet Take 1 tablet (50 mg total) by mouth daily.  . simvastatin (ZOCOR) 10 MG tablet TAKE 1 TABLET BY MOUTH EVERYDAY AT BEDTIME   No facility-administered encounter medications on file as of 04/03/2020.    Follow-up: No follow-ups on file.   Carolann Littler, MD

## 2020-04-13 DIAGNOSIS — H524 Presbyopia: Secondary | ICD-10-CM | POA: Diagnosis not present

## 2020-04-13 DIAGNOSIS — H0289 Other specified disorders of eyelid: Secondary | ICD-10-CM | POA: Diagnosis not present

## 2020-04-13 DIAGNOSIS — H52223 Regular astigmatism, bilateral: Secondary | ICD-10-CM | POA: Diagnosis not present

## 2020-04-13 DIAGNOSIS — H25813 Combined forms of age-related cataract, bilateral: Secondary | ICD-10-CM | POA: Diagnosis not present

## 2020-04-13 DIAGNOSIS — H2513 Age-related nuclear cataract, bilateral: Secondary | ICD-10-CM | POA: Diagnosis not present

## 2020-04-13 DIAGNOSIS — H5203 Hypermetropia, bilateral: Secondary | ICD-10-CM | POA: Diagnosis not present

## 2020-04-21 ENCOUNTER — Other Ambulatory Visit: Payer: Self-pay | Admitting: *Deleted

## 2020-04-21 DIAGNOSIS — F339 Major depressive disorder, recurrent, unspecified: Secondary | ICD-10-CM

## 2020-04-21 DIAGNOSIS — F419 Anxiety disorder, unspecified: Secondary | ICD-10-CM

## 2020-04-21 DIAGNOSIS — E7800 Pure hypercholesterolemia, unspecified: Secondary | ICD-10-CM

## 2020-04-21 NOTE — Addendum Note (Signed)
Addended by: Antonietta Barcelona D on: 04/21/2020 12:14 PM   Modules accepted: Orders

## 2020-04-27 ENCOUNTER — Other Ambulatory Visit: Payer: Self-pay | Admitting: Family Medicine

## 2020-04-27 DIAGNOSIS — F419 Anxiety disorder, unspecified: Secondary | ICD-10-CM

## 2020-04-27 DIAGNOSIS — F339 Major depressive disorder, recurrent, unspecified: Secondary | ICD-10-CM

## 2020-04-27 DIAGNOSIS — E7800 Pure hypercholesterolemia, unspecified: Secondary | ICD-10-CM

## 2020-04-28 ENCOUNTER — Other Ambulatory Visit: Payer: Self-pay | Admitting: Family Medicine

## 2020-04-28 DIAGNOSIS — F419 Anxiety disorder, unspecified: Secondary | ICD-10-CM

## 2020-04-28 DIAGNOSIS — F339 Major depressive disorder, recurrent, unspecified: Secondary | ICD-10-CM

## 2020-04-28 DIAGNOSIS — E7800 Pure hypercholesterolemia, unspecified: Secondary | ICD-10-CM

## 2020-04-28 MED ORDER — SERTRALINE HCL 50 MG PO TABS: 50.0000 mg | ORAL_TABLET | Freq: Every day | ORAL | 3 refills | Status: DC

## 2020-04-28 MED ORDER — SIMVASTATIN 10 MG PO TABS: ORAL_TABLET | ORAL | 3 refills | Status: DC

## 2020-04-28 NOTE — Addendum Note (Signed)
Addended by: Agnes Lawrence on: 04/28/2020 12:20 PM   Modules accepted: Orders

## 2020-04-28 NOTE — Telephone Encounter (Signed)
simvastatin (ZOCOR) 10 MG tablet  sertraline (ZOLOFT) 50 MG tablet(Expired)  [id number] - [place], [place] - [address] Phone:  [phone]  Fax:  [phone]

## 2020-04-28 NOTE — Telephone Encounter (Signed)
Pt called stating that she is returning the call back to the office.

## 2020-04-28 NOTE — Telephone Encounter (Signed)
Rx done. 

## 2020-04-28 NOTE — Telephone Encounter (Signed)
Refill for 1 year 

## 2020-04-28 NOTE — Telephone Encounter (Signed)
Please advise these were not filled by you

## 2020-05-18 ENCOUNTER — Ambulatory Visit: Payer: Medicare Other | Admitting: Family Medicine

## 2020-05-23 DIAGNOSIS — D485 Neoplasm of uncertain behavior of skin: Secondary | ICD-10-CM | POA: Diagnosis not present

## 2020-05-23 DIAGNOSIS — C44729 Squamous cell carcinoma of skin of left lower limb, including hip: Secondary | ICD-10-CM | POA: Diagnosis not present

## 2020-06-05 ENCOUNTER — Other Ambulatory Visit: Payer: Self-pay

## 2020-06-05 ENCOUNTER — Encounter: Payer: Self-pay | Admitting: Family Medicine

## 2020-06-05 ENCOUNTER — Ambulatory Visit (INDEPENDENT_AMBULATORY_CARE_PROVIDER_SITE_OTHER): Payer: Medicare Other | Admitting: Family Medicine

## 2020-06-05 VITALS — BP 126/72 | HR 65 | Temp 98.4°F | Ht 67.25 in | Wt 181.1 lb

## 2020-06-05 DIAGNOSIS — F339 Major depressive disorder, recurrent, unspecified: Secondary | ICD-10-CM | POA: Diagnosis not present

## 2020-06-05 DIAGNOSIS — Z0000 Encounter for general adult medical examination without abnormal findings: Secondary | ICD-10-CM

## 2020-06-05 NOTE — Progress Notes (Signed)
Established Kelly George Office Visit  Subjective:  Kelly George ID: Kelly George, female    DOB: 1945-11-12  Age: 74 y.o. MRN: [id number]  CC:  Chief Complaint  Kelly George presents with  . Annual Exam    HPI Kelly George presents for Medicare wellness visit and to discuss recurrent depression. .  Kelly George just recently establish care here.  Kelly George has history of depression, GERD, hyperlipidemia.  Remains on simvastatin and sertraline.  Kelly George has had some recurrent depression issues and has felt some increased depression recently though for symptoms currently stable.  Kelly George is reluctant to make any additional changes in medication yet.  Kelly George enjoys riding horses and has a horse that Kelly George rides fairly regularly.  No suicidal ideation.  No anhedonia.    Kelly George has had previous Zostavax but no history of Shingrix.  Kelly George is due for mammogram this year.  Still needs flu vaccine.  DEXA scan 2019 was normal  1.  Risk factors based on Past Medical , Social, and Family history reviewed and as indicated above with no changes  2.  Limitations in physical activities None.  No recent falls. Enjoys horseback riding.  Kelly George stays very active.  No balance issues.  3.  Depression/mood .  PHQ-9 equals 10  4.  Hearing No deficits  5.  ADLs independent in all.  6.  Cognitive function (orientation to time and place, language, writing, speech,memory) no short or long term memory issues.  Language and judgement intact.  7.  Home Safety no issues  8.  Height, weight, and visual acuity.all stable. Wt Readings from Last 3 Encounters:  06/05/20 181 lb 1.6 oz (82.1 kg)  04/03/20 182 lb 9.6 oz (82.8 kg)  11/18/19 187 lb (84.8 kg)    9.  Counseling discussed Counseled regarding age and gender appropriate preventative screenings and immunizations.  10. Recommendation of preventive services.  Continue annual flu vaccine  Recommend repeat mammogram Discussed Shingrix vaccine and Kelly George will consider getting at  pharmacy  11. Labs based on risk factors-none due at this time.  We will plan to get follow-up labs at 39-month follow-up  12. Care Plan-as below   13. Other Providers Other Kelly George Care Team Members Specialty     Paula Compton, MD Obstetrics and Gynecology  Pieter Partridge, DO Neurology  Ilean China, RN N/A    14. Written schedule of screening/prevention services given to Kelly George.  Health Maintenance  Topic Date Due  . INFLUENZA VACCINE  05/14/2020  . Hepatitis C Screening  07/11/2020 (Originally [birth year])  . TETANUS/TDAP  08/05/2021  . DEXA SCAN  Completed  . COVID-19 Vaccine  Completed  . PNA vac Low Risk Adult  Completed      Past Medical History:  Diagnosis Date  . Anxiety   . Cardiac arrhythmia due to congenital heart disease   . Depression   . Heart murmur   . Hyperlipidemia   . Seasonal allergies     Past Surgical History:  Procedure Laterality Date  . BREAST EXCISIONAL BIOPSY Left   . COLONOSCOPY  07/07/01  . SKIN SURGERY     DERM - nasal   . TUBAL LIGATION      Family History  Problem Relation Age of Onset  . Breast cancer Mother   . COPD Mother   . Diabetes Father   . Cancer Father        Brain  . Heart attack Father 49  . Alcohol abuse Father   . Depression Father   .  Uterine cancer Maternal Aunt   . Hearing loss Maternal Grandmother   . Hypertension Maternal Grandfather   . Kidney disease Maternal Grandfather   . Stroke Maternal Grandfather   . High Cholesterol Brother   . Hypertension Brother   . Hyperlipidemia Brother   . Autoimmune disease Daughter   . Ankylosing spondylitis Daughter   . Healthy Son   . Depression Son     Social History   Socioeconomic History  . Marital status: Divorced    Spouse name: Not on file  . Number of children: 2  . Years of education: Not on file  . Highest education level: Associate degree: academic program  Occupational History  . Occupation: Retired  Tobacco Use  . Smoking status:  Never Smoker  . Smokeless tobacco: Never Used  Vaping Use  . Vaping Use: Never used  Substance and Sexual Activity  . Alcohol use: Yes    Alcohol/week: 22.0 standard drinks    Types: 8 Glasses of wine, 14 Cans of beer per week  . Drug use: No  . Sexual activity: Not Currently  Other Topics Concern  . Not on file  Social History Narrative   Live alone.     Kelly George is right-handed. Kelly George lives alone in a 1 story house, 3-4 steps to enter.    Social Determinants of Health   Financial Resource Strain:   . Difficulty of Paying Living Expenses: Not on file  Food Insecurity:   . Worried About Charity fundraiser in the Last Year: Not on file  . Ran Out of Food in the Last Year: Not on file  Transportation Needs:   . Lack of Transportation (Medical): Not on file  . Lack of Transportation (Non-Medical): Not on file  Physical Activity:   . Days of Exercise per Week: Not on file  . Minutes of Exercise per Session: Not on file  Stress: No Stress Concern Present  . Feeling of Stress : Not at all  Social Connections:   . Frequency of Communication with Friends and Family: Not on file  . Frequency of Social Gatherings with Friends and Family: Not on file  . Attends Religious Services: Not on file  . Active Member of Clubs or Organizations: Not on file  . Attends Archivist Meetings: Not on file  . Marital Status: Not on file  Intimate Partner Violence:   . Fear of Current or Ex-Partner: Not on file  . Emotionally Abused: Not on file  . Physically Abused: Not on file  . Sexually Abused: Not on file    Outpatient Medications Prior to Visit  Medication Sig Dispense Refill  . aspirin EC 81 MG tablet Take 81 mg by mouth daily.    . calcium-vitamin D (OSCAL WITH D) 500-200 MG-UNIT per tablet Take 1 tablet by mouth daily.    . Cholecalciferol (VITAMIN D3) 2000 UNITS TABS Take 5,000 Units by mouth daily.     . fish oil-omega-3 fatty acids 1000 MG capsule Take 1,200 mg by mouth  daily.     Marland Kitchen glucosamine-chondroitin 500-400 MG tablet Take 1 tablet by mouth 2 (two) times daily.     . naproxen sodium (ANAPROX) 220 MG tablet Take 220 mg by mouth daily. TAKE TWO TABLETS ONCE DAILY    . sertraline (ZOLOFT) 50 MG tablet Take 1 tablet (50 mg total) by mouth daily. 90 tablet 3  . simvastatin (ZOCOR) 10 MG tablet TAKE 1 TABLET BY MOUTH EVERYDAY AT BEDTIME 90 tablet 3  No facility-administered medications prior to visit.    Allergies  Allergen Reactions  . Penicillins Rash  . Tetracyclines & Related Palpitations    ROS Review of Systems  Constitutional: Negative for fatigue.  Eyes: Negative for visual disturbance.  Respiratory: Negative for cough, chest tightness, shortness of breath and wheezing.   Cardiovascular: Negative for chest pain, palpitations and leg swelling.  Endocrine: Negative for polydipsia and polyuria.  Neurological: Negative for dizziness, seizures, syncope, weakness, light-headedness and headaches.      Objective:    Physical Exam Constitutional:      Appearance: Kelly George is well-developed.  HENT:     Ears:     Comments: Kelly George does have some fresh appearing blood inferior right canal, but no signs of TM trauma.  No actively bleeding.  Eyes:     Pupils: Pupils are equal, round, and reactive to light.  Neck:     Thyroid: No thyromegaly.     Vascular: No JVD.  Cardiovascular:     Rate and Rhythm: Normal rate and regular rhythm.     Heart sounds: No gallop.   Pulmonary:     Effort: Pulmonary effort is normal. No respiratory distress.     Breath sounds: Normal breath sounds. No wheezing or rales.  Musculoskeletal:     Cervical back: Neck supple.     Right lower leg: No edema.     Left lower leg: No edema.  Neurological:     Mental Status: Kelly George is alert.     BP 126/72 (BP Location: Left Arm, Kelly George Position: Sitting, Cuff Size: Large)   Pulse 65   Temp 98.4 F (36.9 C) (Oral)   Ht 5' 7.25" (1.708 m)   Wt 181 lb 1.6 oz (82.1 kg)   BMI  28.15 kg/m  Wt Readings from Last 3 Encounters:  06/05/20 181 lb 1.6 oz (82.1 kg)  04/03/20 182 lb 9.6 oz (82.8 kg)  11/18/19 187 lb (84.8 kg)     Health Maintenance Due  Topic Date Due  . INFLUENZA VACCINE  05/14/2020    There are no preventive care reminders to display for this Kelly George.  Lab Results  Component Value Date   TSH 2.150 11/18/2019   Lab Results  Component Value Date   WBC 4.0 11/18/2019   HGB 14.6 11/18/2019   HCT 43.4 11/18/2019   MCV 95 11/18/2019   PLT 262 11/18/2019   Lab Results  Component Value Date   NA 140 11/18/2019   K 4.2 11/18/2019   CO2 25 11/18/2019   GLUCOSE 93 11/18/2019   BUN 18 11/18/2019   CREATININE 0.66 11/18/2019   BILITOT 0.7 11/18/2019   ALKPHOS 76 11/18/2019   AST 21 11/18/2019   ALT 24 11/18/2019   PROT 7.0 11/18/2019   ALBUMIN 4.5 11/18/2019   CALCIUM 10.0 11/18/2019   Lab Results  Component Value Date   CHOL 179 11/18/2019   Lab Results  Component Value Date   HDL 81 11/18/2019   Lab Results  Component Value Date   LDLCALC 86 11/18/2019   Lab Results  Component Value Date   TRIG 63 11/18/2019   Lab Results  Component Value Date   CHOLHDL 2.2 11/18/2019   No results found for: HGBA1C    Assessment & Plan:   Generally healthy 74 year old female.  Kelly George has history of recurrent depression which Kelly George feels is relatively stable though her PHQ-9 score was 10.  We did discuss options of titrating sertraline versus additional medication Kelly George would like  to wait at this time  -Recommend annual flu vaccine as Kelly George wishes to wait till September -Set up repeat mammogram -Continue regular weightbearing exercise and continue daily calcium and vitamin D - discussed Shingrix vaccine and Kelly George will check at pharmacy  No orders of the defined types were placed in this encounter.   Follow-up: Return in about 6 months (around 12/06/2020).   Will plan on follow up labs then.     Carolann Littler, MD

## 2020-06-05 NOTE — Patient Instructions (Signed)
Remember flu vaccine this Fall  Set up repeat mammogram  Consider Shingrix vaccine and check at pharmacy if interested.

## 2020-06-21 ENCOUNTER — Telehealth: Payer: Medicare Other

## 2020-07-28 DIAGNOSIS — L570 Actinic keratosis: Secondary | ICD-10-CM | POA: Diagnosis not present

## 2020-07-28 DIAGNOSIS — C44729 Squamous cell carcinoma of skin of left lower limb, including hip: Secondary | ICD-10-CM | POA: Diagnosis not present

## 2020-07-31 DIAGNOSIS — Z23 Encounter for immunization: Secondary | ICD-10-CM | POA: Diagnosis not present

## 2020-08-09 ENCOUNTER — Telehealth: Payer: Self-pay | Admitting: Family Medicine

## 2020-08-09 NOTE — Telephone Encounter (Signed)
Left message for patient to schedule Annual Wellness Visit.  Please schedule with Nurse Health Advisor Shannon Crews, RN at [facility]  

## 2020-08-15 DIAGNOSIS — Z23 Encounter for immunization: Secondary | ICD-10-CM | POA: Diagnosis not present

## 2020-08-16 ENCOUNTER — Other Ambulatory Visit: Payer: Self-pay

## 2020-08-16 ENCOUNTER — Ambulatory Visit (INDEPENDENT_AMBULATORY_CARE_PROVIDER_SITE_OTHER): Payer: Medicare Other

## 2020-08-16 ENCOUNTER — Other Ambulatory Visit: Payer: Self-pay | Admitting: Family Medicine

## 2020-08-16 DIAGNOSIS — Z1231 Encounter for screening mammogram for malignant neoplasm of breast: Secondary | ICD-10-CM

## 2020-08-16 DIAGNOSIS — Z0000 Encounter for general adult medical examination without abnormal findings: Secondary | ICD-10-CM | POA: Diagnosis not present

## 2020-08-16 NOTE — Patient Instructions (Addendum)
Kelly George , Thank you for taking time to come for your Medicare Wellness Visit. I appreciate your ongoing commitment to your health goals. Please review the following plan we discussed and let me know if I can assist you in the future.   Screening recommendations/referrals: Colonoscopy: Up to date, next due 07/06/2022 Mammogram: Currently due, if you would like our office to get you set up please let us know Bone Density: No longer required  Recommended yearly ophthalmology/optometry visit for glaucoma screening and checkup Recommended yearly dental visit for hygiene and checkup  Vaccinations: Influenza vaccine: Up to date, next due fall 2022 Pneumococcal vaccine: Completed series  Tdap vaccine: Up to date, next due 08/05/2021 Shingles vaccine: Currently due for Shingrix, we recommend you get this vaccine at your pharmacy as it is usually cheaper.    Advanced directives: Please bring a copy of your advanced medical directives into our office so that we may scan them into your chart.   Conditions/risks identified: None   Next appointment: 12/06/2020 @ 9:30 am with Dr. Elease Hashimoto    Preventive Care 65 Years and Older, Female Preventive care refers to lifestyle choices and visits with your health care provider that can promote health and wellness. What does preventive care include?  A yearly physical exam. This is also called an annual well check.  Dental exams once or twice a year.  Routine eye exams. Ask your health care provider how often you should have your eyes checked.  Personal lifestyle choices, including:  Daily care of your teeth and gums.  Regular physical activity.  Eating a healthy diet.  Avoiding tobacco and drug use.  Limiting alcohol use.  Practicing safe sex.  Taking low-dose aspirin every day.  Taking vitamin and mineral supplements as recommended by your health care provider. What happens during an annual well check? The services and screenings done  by your health care provider during your annual well check will depend on your age, overall health, lifestyle risk factors, and family history of disease. Counseling  Your health care provider may ask you questions about your:  Alcohol use.  Tobacco use.  Drug use.  Emotional well-being.  Home and relationship well-being.  Sexual activity.  Eating habits.  History of falls.  Memory and ability to understand (cognition).  Work and work Statistician.  Reproductive health. Screening  You may have the following tests or measurements:  Height, weight, and BMI.  Blood pressure.  Lipid and cholesterol levels. These may be checked every 5 years, or more frequently if you are over 76 years old.  Skin check.  Lung cancer screening. You may have this screening every year starting at age 76 if you have a 30-pack-year history of smoking and currently smoke or have quit within the past 15 years.  Fecal occult blood test (FOBT) of the stool. You may have this test every year starting at age 76.  Flexible sigmoidoscopy or colonoscopy. You may have a sigmoidoscopy every 5 years or a colonoscopy every 10 years starting at age 76.  Hepatitis C blood test.  Hepatitis B blood test.  Sexually transmitted disease (STD) testing.  Diabetes screening. This is done by checking your blood sugar (glucose) after you have not eaten for a while (fasting). You may have this done every 1-3 years.  Bone density scan. This is done to screen for osteoporosis. You may have this done starting at age 76.  Mammogram. This may be done every 1-2 years. Talk to your health care provider  about how often you should have regular mammograms. Talk with your health care provider about your test results, treatment options, and if necessary, the need for more tests. Vaccines  Your health care provider may recommend certain vaccines, such as:  Influenza vaccine. This is recommended every year.  Tetanus,  diphtheria, and acellular pertussis (Tdap, Td) vaccine. You may need a Td booster every 10 years.  Zoster vaccine. You may need this after age 76.  Pneumococcal 13-valent conjugate (PCV13) vaccine. One dose is recommended after age 76.  Pneumococcal polysaccharide (PPSV23) vaccine. One dose is recommended after age 76. Talk to your health care provider about which screenings and vaccines you need and how often you need them. This information is not intended to replace advice given to you by your health care provider. Make sure you discuss any questions you have with your health care provider. Document Released: 10/27/2015 Document Revised: 06/19/2016 Document Reviewed: 08/01/2015 Elsevier Interactive Patient Education  2017 [place] Prevention in the Home Falls can cause injuries. They can happen to people of all ages. There are many things you can do to make your home safe and to help prevent falls. What can I do on the outside of my home?  Regularly fix the edges of walkways and driveways and fix any cracks.  Remove anything that might make you trip as you walk through a door, such as a raised step or threshold.  Trim any bushes or trees on the path to your home.  Use bright outdoor lighting.  Clear any walking paths of anything that might make someone trip, such as rocks or tools.  Regularly check to see if handrails are loose or broken. Make sure that both sides of any steps have handrails.  Any raised decks and porches should have guardrails on the edges.  Have any leaves, snow, or ice cleared regularly.  Use sand or salt on walking paths during winter.  Clean up any spills in your garage right away. This includes oil or grease spills. What can I do in the bathroom?  Use night lights.  Install grab bars by the toilet and in the tub and shower. Do not use towel bars as grab bars.  Use non-skid mats or decals in the tub or shower.  If you need to sit down in  the shower, use a plastic, non-slip stool.  Keep the floor dry. Clean up any water that spills on the floor as soon as it happens.  Remove soap buildup in the tub or shower regularly.  Attach bath mats securely with double-sided non-slip rug tape.  Do not have throw rugs and other things on the floor that can make you trip. What can I do in the bedroom?  Use night lights.  Make sure that you have a light by your bed that is easy to reach.  Do not use any sheets or blankets that are too big for your bed. They should not hang down onto the floor.  Have a firm chair that has side arms. You can use this for support while you get dressed.  Do not have throw rugs and other things on the floor that can make you trip. What can I do in the kitchen?  Clean up any spills right away.  Avoid walking on wet floors.  Keep items that you use a lot in easy-to-reach places.  If you need to reach something above you, use a strong step stool that has a grab bar.  Keep electrical cords out of the way.  Do not use floor polish or wax that makes floors slippery. If you must use wax, use non-skid floor wax.  Do not have throw rugs and other things on the floor that can make you trip. What can I do with my stairs?  Do not leave any items on the stairs.  Make sure that there are handrails on both sides of the stairs and use them. Fix handrails that are broken or loose. Make sure that handrails are as long as the stairways.  Check any carpeting to make sure that it is firmly attached to the stairs. Fix any carpet that is loose or worn.  Avoid having throw rugs at the top or bottom of the stairs. If you do have throw rugs, attach them to the floor with carpet tape.  Make sure that you have a light switch at the top of the stairs and the bottom of the stairs. If you do not have them, ask someone to add them for you. What else can I do to help prevent falls?  Wear shoes that:  Do not have high  heels.  Have rubber bottoms.  Are comfortable and fit you well.  Are closed at the toe. Do not wear sandals.  If you use a stepladder:  Make sure that it is fully opened. Do not climb a closed stepladder.  Make sure that both sides of the stepladder are locked into place.  Ask someone to hold it for you, if possible.  Clearly mark and make sure that you can see:  Any grab bars or handrails.  First and last steps.  Where the edge of each step is.  Use tools that help you move around (mobility aids) if they are needed. These include:  Canes.  Walkers.  Scooters.  Crutches.  Turn on the lights when you go into a dark area. Replace any light bulbs as soon as they burn out.  Set up your furniture so you have a clear path. Avoid moving your furniture around.  If any of your floors are uneven, fix them.  If there are any pets around you, be aware of where they are.  Review your medicines with your doctor. Some medicines can make you feel dizzy. This can increase your chance of falling. Ask your doctor what other things that you can do to help prevent falls. This information is not intended to replace advice given to you by your health care provider. Make sure you discuss any questions you have with your health care provider. Document Released: 07/27/2009 Document Revised: 03/07/2016 Document Reviewed: 11/04/2014 Elsevier Interactive Patient Education  2017 [employer].

## 2020-08-16 NOTE — Progress Notes (Signed)
Subjective:   Kelly George is a 74 y.o. female who presents for Medicare Annual (Subsequent) preventive examination.  I connected with Kelly George  today by telephone and verified that I am speaking with the correct person using two identifiers. Location patient: home Location provider: work Persons participating in the virtual visit: patient, provider.   I discussed the limitations, risks, security and privacy concerns of performing an evaluation and management service by telephone and the availability of in person appointments. I also discussed with the patient that there may be a patient responsible charge related to this service. The patient expressed understanding and verbally consented to this telephonic visit.    Interactive audio and video telecommunications were attempted between this provider and patient, however failed, due to patient having technical difficulties OR patient did not have access to video capability.  We continued and completed visit with audio only.      Review of Systems    N/A  Cardiac Risk Factors include: advanced age (>2men, >7 women);dyslipidemia     Objective:    Today's Vitals   There is no height or weight on file to calculate BMI.  Advanced Directives 08/16/2020 08/16/2019 08/14/2018  Does Patient Have a Medical Advance Directive? Yes No No  Type of Paramedic of [place];Living will - -  Does patient want to make changes to medical advance directive? No - Patient declined - -  Copy of [place] in Chart? No - copy requested - -  Would patient like information on creating a medical advance directive? - No - Patient declined Yes (MAU/Ambulatory/Procedural Areas - Information given)    Current Medications (verified) Outpatient Encounter Medications as of 08/16/2020  Medication Sig  . Ascorbic Acid (VITAMIN C) 1000 MG tablet Take 1,000 mg by mouth daily.  . calcium-vitamin D (OSCAL WITH D)  500-200 MG-UNIT per tablet Take 1 tablet by mouth daily.  . Cholecalciferol (VITAMIN D3) 2000 UNITS TABS Take 5,000 Units by mouth daily.   . fish oil-omega-3 fatty acids 1000 MG capsule Take 1,200 mg by mouth daily.   Marland Kitchen glucosamine-chondroitin 500-400 MG tablet Take 1 tablet by mouth 2 (two) times daily.   . naproxen sodium (ANAPROX) 220 MG tablet Take 220 mg by mouth daily. TAKE TWO TABLETS ONCE DAILY  . sertraline (ZOLOFT) 50 MG tablet Take 1 tablet (50 mg total) by mouth daily.  . simvastatin (ZOCOR) 10 MG tablet TAKE 1 TABLET BY MOUTH EVERYDAY AT BEDTIME  . aspirin EC 81 MG tablet Take 81 mg by mouth daily.   No facility-administered encounter medications on file as of 08/16/2020.    Allergies (verified) Penicillins and Tetracyclines & related   History: Past Medical History:  Diagnosis Date  . Anxiety   . Cardiac arrhythmia due to congenital heart disease   . Depression   . Heart murmur   . Hyperlipidemia   . Seasonal allergies    Past Surgical History:  Procedure Laterality Date  . BREAST EXCISIONAL BIOPSY Left   . COLONOSCOPY  07/07/01  . SKIN SURGERY     DERM - nasal   . TOOTH EXTRACTION    . TUBAL LIGATION     Family History  Problem Relation Age of Onset  . Breast cancer Mother   . COPD Mother   . Diabetes Father   . Cancer Father        Brain  . Heart attack Father 33  . Alcohol abuse Father   . Depression  Father   . Uterine cancer Maternal Aunt   . Hearing loss Maternal Grandmother   . Hypertension Maternal Grandfather   . Kidney disease Maternal Grandfather   . Stroke Maternal Grandfather   . High Cholesterol Brother   . Hypertension Brother   . Hyperlipidemia Brother   . Autoimmune disease Daughter   . Ankylosing spondylitis Daughter   . Healthy Son   . Depression Son    Social History   Socioeconomic History  . Marital status: Divorced    Spouse name: Not on file  . Number of children: 2  . Years of education: Not on file  . Highest  education level: Associate degree: academic program  Occupational History  . Occupation: Retired  Tobacco Use  . Smoking status: Never Smoker  . Smokeless tobacco: Never Used  Vaping Use  . Vaping Use: Never used  Substance and Sexual Activity  . Alcohol use: Yes    Alcohol/week: 22.0 standard drinks    Types: 8 Glasses of wine, 14 Cans of beer per week  . Drug use: No  . Sexual activity: Not Currently  Other Topics Concern  . Not on file  Social History Narrative   Live alone.     Patient is right-handed. She lives alone in a 1 story house, 3-4 steps to enter.    Social Determinants of Health   Financial Resource Strain: Low Risk   . Difficulty of Paying Living Expenses: Not hard at all  Food Insecurity: No Food Insecurity  . Worried About Charity fundraiser in the Last Year: Never true  . Ran Out of Food in the Last Year: Never true  Transportation Needs: No Transportation Needs  . Lack of Transportation (Medical): No  . Lack of Transportation (Non-Medical): No  Physical Activity: Inactive  . Days of Exercise per Week: 0 days  . Minutes of Exercise per Session: 0 min  Stress: No Stress Concern Present  . Feeling of Stress : Not at all  Social Connections: Socially Isolated  . Frequency of Communication with Friends and Family: More than three times a week  . Frequency of Social Gatherings with Friends and Family: Three times a week  . Attends Religious Services: Never  . Active Member of Clubs or Organizations: No  . Attends Archivist Meetings: Never  . Marital Status: Divorced    Tobacco Counseling Counseling given: Not Answered   Clinical Intake:  Pre-visit preparation completed: Yes  Pain : No/denies pain     Nutritional Risks: Nausea/ vomitting/ diarrhea (Diarrhea) Diabetes: No  How often do you need to have someone help you when you read instructions, pamphlets, or other written materials from your doctor or pharmacy?: 1 - Never What is  the last grade level you completed in school?: 2 years of college  Diabetic?No  Interpreter Needed?: No  Information entered by :: [place] of Daily Living In your present state of health, do you have any difficulty performing the following activities: 08/16/2020  Hearing? N  Vision? N  Difficulty concentrating or making decisions? Y  Walking or climbing stairs? N  Dressing or bathing? N  Doing errands, shopping? N  Preparing Food and eating ? N  Using the Toilet? N  In the past six months, have you accidently leaked urine? Y  Comment Has some occassional leakage with urgency  Do you have problems with loss of bowel control? N  Managing your Medications? N  Managing your Finances? N  Housekeeping  or managing your Housekeeping? N  Some recent data might be hidden    Patient Care Team: Eulas Post, MD as PCP - General (Family Medicine) Paula Compton, MD (Obstetrics and Gynecology) Pieter Partridge, DO as Consulting Physician (Neurology) Ilean China, RN as Registered Nurse  Indicate any recent [place] you may have received from other than [facility] providers in the past year (date may be approximate).     Assessment:   This is a routine wellness examination for [place].  Hearing/Vision screen  Hearing Screening   125Hz  250Hz  500Hz  1000Hz  2000Hz  3000Hz  4000Hz  6000Hz  8000Hz   Right ear:           Left ear:           Vision Screening Comments: Patient states gets eyes checked once per year    Dietary issues and exercise activities discussed: Current Exercise Habits: Home exercise routine;The patient does not participate in regular exercise at present, Exercise limited by: None identified  Goals    . DIET - INCREASE WATER INTAKE     Try to drink 6-8 glasses of water daily    . Exercise 150 min/wk Moderate Activity      Depression Screen PHQ 2/9 Scores 08/16/2020 06/05/2020 11/18/2019 08/16/2019 07/16/2019 07/13/2019 11/18/2018  PHQ - 2 Score 0  1 3 0 0 0 2  PHQ- 9 Score 0 - 11 - - - 7    Fall Risk Fall Risk  08/16/2020 06/05/2020 11/18/2019 08/16/2019 07/16/2019  Falls in the past year? 0 0 0 0 0  Number falls in past yr: 0 0 - - -  Injury with Fall? 0 0 - - -  Risk Factor Category  - - - - -  Risk for fall due to : No Fall Risks - - - -  Risk for fall due to: Comment - - - - -  Follow up Falls evaluation completed;Falls prevention discussed - - - -    Any stairs in or around the home? Yes  If so, are there any without handrails? No  Home free of loose throw rugs in walkways, pet beds, electrical cords, etc? Yes  Adequate lighting in your home to reduce risk of falls? Yes   ASSISTIVE DEVICES UTILIZED TO PREVENT FALLS:  Life alert? No  Use of a cane, walker or w/c? No  Grab bars in the bathroom? Yes  Shower chair or bench in shower? No  Elevated toilet seat or a handicapped toilet? No    Cognitive Function: Cognition within normal limits based on direct observation. Cognitive screening was offered to patient however patient politely declined MMSE - Mini Mental State Exam 08/14/2018 05/20/2018 03/11/2018 06/25/2017  Orientation to time 5 5 5 5   Orientation to Place 5 5 5 5   Registration 3 3 3 3   Attention/ Calculation 5 5 5 5   Recall 3 2 3 1   Language- name 2 objects 2 2 2 2   Language- repeat 1 1 1 1   Language- follow 3 step command 3 3 3 3   Language- read & follow direction 1 1 1 1   Write a sentence 1 1 1 1   Copy design 1 0 1 1  Total score 30 28 30 28      6CIT Screen 08/16/2019  What Year? 0 points  What month? 0 points  What time? 0 points  Count back from 20 0 points  Months in reverse 0 points  Repeat phrase 0 points  Total Score 0    Immunizations  Immunization History  Administered Date(s) Administered  . Fluad Quad(high Dose 65+) 07/13/2019  . Influenza Whole 08/28/2012  . Influenza, High Dose Seasonal PF 08/18/2015, 09/30/2016, 08/04/2017, 07/16/2018, 07/31/2020  . Influenza,inj,Quad PF,6+ Mos  07/08/2013, 08/10/2014  . Moderna SARS-COVID-2 Vaccination 01/13/2020, 02/02/2020  . Pneumococcal Conjugate-13 01/05/2014  . Pneumococcal Polysaccharide-23 04/13/2010  . Tdap 08/06/2011  . Zoster 01/13/2007    TDAP status: Up to date Flu Vaccine status: Up to date Pneumococcal vaccine status: Up to date Covid-19 vaccine status: Completed vaccines  Qualifies for Shingles Vaccine? Yes   Zostavax completed Yes   Shingrix Completed?: No.    Education has been provided regarding the importance of this vaccine. Patient has been advised to call insurance company to determine out of pocket expense if they have not yet received this vaccine. Advised may also receive vaccine at local pharmacy or Health Dept. Verbalized acceptance and understanding.  Screening Tests Health Maintenance  Topic Date Due  . Hepatitis C Screening  Never done  . TETANUS/TDAP  08/05/2021  . INFLUENZA VACCINE  Completed  . DEXA SCAN  Completed  . COVID-19 Vaccine  Completed  . PNA vac Low Risk Adult  Completed    Health Maintenance  Health Maintenance Due  Topic Date Due  . Hepatitis C Screening  Never done    Colorectal cancer screening: Completed 07/06/2012. Repeat every 10 years Mammogram status: Completed 06/24/2018. Repeat every year Bone Density status: Completed 06/24/2018. Results reflect: Bone density results: NORMAL. Repeat every 0 years.  Lung Cancer Screening: (Low Dose CT Chest recommended if Age 62-80 years, 30 pack-year currently smoking OR have quit w/in 15years.) does not qualify.   Lung Cancer Screening Referral: N/A   Additional Screening:  Hepatitis C Screening: does qualify;  Vision Screening: Recommended annual ophthalmology exams for early detection of glaucoma and other disorders of the eye. Is the patient up to date with their annual eye exam?  Yes  Who is the provider or what is the name of the office in which the patient attends annual eye exams? Dr. Bufford Buttner  If pt is  not established with a provider, would they like to be referred to a provider to establish care? No .   Dental Screening: Recommended annual dental exams for proper oral hygiene  Community Resource Referral / Chronic Care Management: CRR required this visit?  No   CCM required this visit?  No      Plan:     I have personally reviewed and noted the following in the patient's chart:   . Medical and social history . Use of alcohol, tobacco or illicit drugs  . Current medications and supplements . Functional ability and status . Nutritional status . Physical activity . Advanced directives . List of other physicians . Hospitalizations, surgeries, and ER visits in previous 12 months . Vitals . Screenings to include cognitive, depression, and falls . Referrals and appointments  In addition, I have reviewed and discussed with patient certain preventive protocols, quality metrics, and best practice recommendations. A written personalized care plan for preventive services as well as general preventive health recommendations were provided to patient.     Ofilia Neas, LPN   57/12/2200   Nurse Notes: None

## 2020-08-21 ENCOUNTER — Ambulatory Visit: Payer: Self-pay | Admitting: *Deleted

## 2020-08-21 NOTE — Chronic Care Management (AMB) (Signed)
  Chronic Care Management   Note  08/21/2020 Name: Norvell Ureste MRN: [id number] DOB: August 12, 1946  Patient was outreached by [facility] and enrolled in the CCM program but has since switched PCPs and is now seeing Dr Elease Hashimoto. I am removing patient from CCM program and removing my name from the Care Team List.   Follow up plan: No further follow-up required  Chong Sicilian, BSN, RN-BC [place] [facility] [place] Management Direct Dial: [phone]

## 2020-09-26 DIAGNOSIS — L821 Other seborrheic keratosis: Secondary | ICD-10-CM | POA: Diagnosis not present

## 2020-09-26 DIAGNOSIS — L814 Other melanin hyperpigmentation: Secondary | ICD-10-CM | POA: Diagnosis not present

## 2020-09-26 DIAGNOSIS — Z85828 Personal history of other malignant neoplasm of skin: Secondary | ICD-10-CM | POA: Diagnosis not present

## 2020-09-26 DIAGNOSIS — L905 Scar conditions and fibrosis of skin: Secondary | ICD-10-CM | POA: Diagnosis not present

## 2020-10-02 ENCOUNTER — Ambulatory Visit
Admission: RE | Admit: 2020-10-02 | Discharge: 2020-10-02 | Disposition: A | Discharge status: Home / Self Care | Payer: Medicare Other | Source: Ambulatory Visit | Attending: Family Medicine | Admitting: Family Medicine

## 2020-10-02 ENCOUNTER — Other Ambulatory Visit: Payer: Self-pay

## 2020-10-02 DIAGNOSIS — Z1231 Encounter for screening mammogram for malignant neoplasm of breast: Secondary | ICD-10-CM

## 2020-10-05 ENCOUNTER — Other Ambulatory Visit: Payer: Self-pay | Admitting: Family Medicine

## 2020-10-05 DIAGNOSIS — N6489 Other specified disorders of breast: Secondary | ICD-10-CM

## 2020-10-14 HISTORY — PX: BREAST BIOPSY: SHX20

## 2020-10-23 ENCOUNTER — Ambulatory Visit
Admission: RE | Admit: 2020-10-23 | Discharge: 2020-10-23 | Disposition: A | Discharge status: Home / Self Care | Payer: Medicare Other | Source: Ambulatory Visit | Attending: Family Medicine | Admitting: Family Medicine

## 2020-10-23 ENCOUNTER — Other Ambulatory Visit: Payer: Self-pay | Admitting: Family Medicine

## 2020-10-23 ENCOUNTER — Other Ambulatory Visit: Payer: Self-pay

## 2020-10-23 DIAGNOSIS — N6489 Other specified disorders of breast: Secondary | ICD-10-CM

## 2020-10-23 DIAGNOSIS — R928 Other abnormal and inconclusive findings on diagnostic imaging of breast: Secondary | ICD-10-CM | POA: Diagnosis not present

## 2020-10-27 ENCOUNTER — Ambulatory Visit
Admission: RE | Admit: 2020-10-27 | Discharge: 2020-10-27 | Disposition: A | Discharge status: Home / Self Care | Payer: Medicare Other | Source: Ambulatory Visit | Attending: Family Medicine | Admitting: Family Medicine

## 2020-10-27 ENCOUNTER — Other Ambulatory Visit: Payer: Self-pay

## 2020-10-27 DIAGNOSIS — R928 Other abnormal and inconclusive findings on diagnostic imaging of breast: Secondary | ICD-10-CM | POA: Diagnosis not present

## 2020-10-27 DIAGNOSIS — R921 Mammographic calcification found on diagnostic imaging of breast: Secondary | ICD-10-CM | POA: Diagnosis not present

## 2020-10-27 DIAGNOSIS — N6489 Other specified disorders of breast: Secondary | ICD-10-CM

## 2020-10-27 DIAGNOSIS — D0511 Intraductal carcinoma in situ of right breast: Secondary | ICD-10-CM | POA: Diagnosis not present

## 2020-10-27 DIAGNOSIS — N6312 Unspecified lump in the right breast, upper inner quadrant: Secondary | ICD-10-CM | POA: Diagnosis not present

## 2020-10-27 DIAGNOSIS — C50811 Malignant neoplasm of overlapping sites of right female breast: Secondary | ICD-10-CM | POA: Diagnosis not present

## 2020-11-03 ENCOUNTER — Other Ambulatory Visit: Payer: Medicare Other

## 2020-11-09 DIAGNOSIS — C50411 Malignant neoplasm of upper-outer quadrant of right female breast: Secondary | ICD-10-CM | POA: Diagnosis not present

## 2020-11-10 ENCOUNTER — Other Ambulatory Visit: Payer: Self-pay | Admitting: *Deleted

## 2020-11-10 ENCOUNTER — Telehealth: Payer: Self-pay | Admitting: Hematology and Oncology

## 2020-11-10 DIAGNOSIS — C50411 Malignant neoplasm of upper-outer quadrant of right female breast: Secondary | ICD-10-CM | POA: Insufficient documentation

## 2020-11-10 DIAGNOSIS — Z170 Estrogen receptor positive status [ER+]: Secondary | ICD-10-CM | POA: Insufficient documentation

## 2020-11-10 NOTE — Telephone Encounter (Signed)
Received a new pt referral from Dr. Donne Hazel for a new x of brca. Ms. Kelly George has been cld and scheduled to see Dr. Lindi Adie on 1/31 at 4:15pm. Pt aware to arrive 20 minutes early.

## 2020-11-12 NOTE — Progress Notes (Signed)
[facility] NOTE  Patient Care Team: Eulas Post, MD as PCP - General (Family Medicine) Paula Compton, MD (Obstetrics and Gynecology) Pieter Partridge, DO as Consulting Physician (Neurology)  CHIEF COMPLAINTS/PURPOSE OF CONSULTATION:  Newly diagnosed breast cancer  HISTORY OF PRESENTING ILLNESS:  Kelly George 75 y.o. female is here because of recent diagnosis of invasive mammary carcinoma of the right breast. Screening mammogram on 10/02/20 showed calcifications and a possible mass in the right breast. Diagnostic mammogram and [place] on 10/23/20 showed in the right breast, a 0.9cm cyst at the 8 o'clock position, a 1.0cm lesion at the 12 o'clock position, and no right axillary adenopathy. Biopsy on 10/27/20 showed invasive mammary carcinoma, HER-2 negative (1+), ER+>95%, PR+ 10%, Ki67 5%, and DCIS, ER+ >95%, PR+ 90%. She presents to the clinic today for initial evaluation and discussion of treatment options.  I was able to FaceTime her daughter Janett Billow who was also able to participate in visit today virtually.  I reviewed her records extensively and collaborated the history with the patient.  SUMMARY OF ONCOLOGIC HISTORY: Oncology History  Malignant neoplasm of upper-outer quadrant of right breast in female, estrogen receptor positive ([place])  11/10/2020 Initial Diagnosis   Screening mammogram showed calcifications and a possible mass in the right breast. Diagnostic mammogram and US showed in the right breast, a 0.9cm cyst at the 8 o'clock position, a 1.0cm lesion at the 12 o'clock position, and no right axillary adenopathy. 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     MEDICAL HISTORY:  Past Medical History:  Diagnosis Date  . Anxiety   . Cardiac arrhythmia due to congenital heart disease   . Depression   . Heart murmur   . Hyperlipidemia   . Seasonal allergies     SURGICAL HISTORY: Past  Surgical History:  Procedure Laterality Date  . BREAST EXCISIONAL BIOPSY Left   . COLONOSCOPY  07/07/01  . SKIN SURGERY     DERM - nasal   . TOOTH EXTRACTION    . TUBAL LIGATION      SOCIAL HISTORY: Social History   Socioeconomic History  . Marital status: Divorced    Spouse name: Not on file  . Number of children: 2  . Years of education: Not on file  . Highest education level: Associate degree: academic program  Occupational History  . Occupation: Retired  Tobacco Use  . Smoking status: Never Smoker  . Smokeless tobacco: Never Used  Vaping Use  . Vaping Use: Never used  Substance and Sexual Activity  . Alcohol use: Yes    Alcohol/week: 22.0 standard drinks    Types: 8 Glasses of wine, 14 Cans of beer per week  . Drug use: No  . Sexual activity: Not Currently  Other Topics Concern  . Not on file  Social History Narrative   Live alone.     Patient is right-handed. She lives alone in a 1 story house, 3-4 steps to enter.    Social Determinants of Health   Financial Resource Strain: Low Risk   . Difficulty of Paying Living Expenses: Not hard at all  Food Insecurity: No Food Insecurity  . Worried About Charity fundraiser in the Last Year: Never true  . Ran Out of Food in the Last Year: Never true  Transportation Needs: No Transportation Needs  . Lack of Transportation (Medical): No  . Lack of Transportation (Non-Medical): No  Physical Activity: Inactive  . Days of Exercise per Week: 0 days  . Minutes of Exercise per Session: 0 min  Stress: No Stress Concern Present  . Feeling of Stress : Not at all  Social Connections: Socially Isolated  . Frequency of Communication with Friends and Family: More than three times a week  . Frequency of Social Gatherings with Friends and Family: Three times a week  . Attends Religious Services: Never  . Active Member of Clubs or Organizations: No  . Attends Archivist Meetings: Never  . Marital Status: Divorced   Human resources officer Violence: Not At Risk  . Fear of Current or Ex-Partner: No  . Emotionally Abused: No  . Physically Abused: No  . Sexually Abused: No    FAMILY HISTORY: Family History  Problem Relation Age of Onset  . Breast cancer Mother 73  . COPD Mother   . Diabetes Father   . Cancer Father        Brain  . Heart attack Father 78  . Alcohol abuse Father   . Depression Father   . Uterine cancer Maternal Aunt   . Hearing loss Maternal Grandmother   . Hypertension Maternal Grandfather   . Kidney disease Maternal Grandfather   . Stroke Maternal Grandfather   . High Cholesterol Brother   . Hypertension Brother   . Hyperlipidemia Brother   . Autoimmune disease Daughter   . Ankylosing spondylitis Daughter   . Healthy Son   . Depression Son     ALLERGIES:  is allergic to penicillins and tetracyclines & related.  MEDICATIONS:  Current Outpatient Medications  Medication Sig Dispense Refill  . Ascorbic Acid (VITAMIN C) 1000 MG tablet Take 1,000 mg by mouth daily.    Marland Kitchen aspirin EC 81 MG tablet Take 81 mg by mouth daily.    . calcium-vitamin D (OSCAL WITH D) 500-200 MG-UNIT per tablet Take 1 tablet by mouth daily.    . Cholecalciferol (VITAMIN D3) 2000 UNITS TABS Take 5,000 Units by mouth daily.     . fish oil-omega-3 fatty acids 1000 MG capsule Take 1,200 mg by mouth daily.     Marland Kitchen glucosamine-chondroitin 500-400 MG tablet Take 1 tablet by mouth 2 (two) times daily.     . naproxen sodium (ANAPROX) 220 MG tablet Take 220 mg by mouth daily. TAKE TWO TABLETS ONCE DAILY    . sertraline (ZOLOFT) 50 MG tablet Take 1 tablet (50 mg total) by mouth daily. 90 tablet 3  . simvastatin (ZOCOR) 10 MG tablet TAKE 1 TABLET BY MOUTH EVERYDAY AT BEDTIME 90 tablet 3   No current facility-administered medications for this visit.    REVIEW OF SYSTEMS:   Constitutional: Denies fevers, chills or abnormal night sweats   All other systems were reviewed with the patient and are negative.  PHYSICAL  EXAMINATION: ECOG PERFORMANCE STATUS: 1 - Symptomatic but completely ambulatory  Vitals:   11/13/20 1551  BP: (!) 141/63  Pulse: 77  Resp: 18  Temp: 98 F (36.7 C)  SpO2: 98%   Filed Weights   11/13/20 1551  Weight: 182 lb 4.8 oz (82.7 kg)        LABORATORY DATA:  I have reviewed the data as listed Lab Results  Component Value Date   WBC 4.0 11/18/2019   HGB 14.6 11/18/2019   HCT 43.4 11/18/2019   MCV 95 11/18/2019   PLT 262 11/18/2019   Lab Results  Component Value Date   NA 140 11/18/2019   K  4.2 11/18/2019   CL 100 11/18/2019   CO2 25 11/18/2019    RADIOGRAPHIC STUDIES: I have personally reviewed the radiological reports and agreed with the findings in the report.  ASSESSMENT AND PLAN:  Malignant neoplasm of upper-outer quadrant of right breast in female, estrogen receptor positive ([place]) 11/10/2020: Screening mammogram showed calcifications and a possible mass in the right breast. Diagnostic mammogram and US showed in the right breast, a 0.9cm cyst at the 8 o'clock position, a 1.0cm lesion at the 12 o'clock position, and no right axillary adenopathy. 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  Pathology and radiology counseling:Discussed with the patient, the details of pathology including the type of breast cancer,the clinical staging, the significance of ER, PR and HER-2/neu receptors and the implications for treatment. After reviewing the pathology in detail, we proceeded to discuss the different treatment options between surgery, radiation, chemotherapy, antiestrogen therapies.  Recommendations: 1.  Right mastectomy (because of lesions in multiple quadrants) followed by 2. Adjuvant antiestrogen therapy with anastrozole 1 mg daily x5 to 7 years  I briefly discussed about Oncotype test but I do not believe she needs it based on favorable findings. Return to clinic after surgery to discuss final  pathology report and then sent her prescription for anastrozole. We will get a baseline bone density in the next couple of months.   All questions were answered. The patient knows to call the clinic with any problems, questions or concerns.   Rulon Eisenmenger, MD, MPH 11/13/2020    I, Molly Dorshimer, am acting as scribe for Nicholas Lose, MD.  I have reviewed the above documentation for accuracy and completeness, and I agree with the above.

## 2020-11-13 ENCOUNTER — Other Ambulatory Visit: Payer: Self-pay | Admitting: General Surgery

## 2020-11-13 ENCOUNTER — Inpatient Hospital Stay: Payer: Medicare Other | Attending: Hematology and Oncology | Admitting: Hematology and Oncology

## 2020-11-13 ENCOUNTER — Ambulatory Visit: Payer: Medicare Other | Attending: General Surgery

## 2020-11-13 ENCOUNTER — Other Ambulatory Visit: Payer: Self-pay

## 2020-11-13 VITALS — BP 141/63 | HR 77 | Temp 98.0°F | Resp 18 | Ht 67.25 in | Wt 182.3 lb

## 2020-11-13 DIAGNOSIS — Z8249 Family history of ischemic heart disease and other diseases of the circulatory system: Secondary | ICD-10-CM | POA: Diagnosis not present

## 2020-11-13 DIAGNOSIS — F419 Anxiety disorder, unspecified: Secondary | ICD-10-CM | POA: Insufficient documentation

## 2020-11-13 DIAGNOSIS — F32A Depression, unspecified: Secondary | ICD-10-CM | POA: Insufficient documentation

## 2020-11-13 DIAGNOSIS — Z170 Estrogen receptor positive status [ER+]: Secondary | ICD-10-CM

## 2020-11-13 DIAGNOSIS — Z808 Family history of malignant neoplasm of other organs or systems: Secondary | ICD-10-CM | POA: Insufficient documentation

## 2020-11-13 DIAGNOSIS — E785 Hyperlipidemia, unspecified: Secondary | ICD-10-CM | POA: Diagnosis not present

## 2020-11-13 DIAGNOSIS — Z780 Asymptomatic menopausal state: Secondary | ICD-10-CM

## 2020-11-13 DIAGNOSIS — Z7982 Long term (current) use of aspirin: Secondary | ICD-10-CM | POA: Insufficient documentation

## 2020-11-13 DIAGNOSIS — C50411 Malignant neoplasm of upper-outer quadrant of right female breast: Secondary | ICD-10-CM | POA: Diagnosis not present

## 2020-11-13 DIAGNOSIS — Z8349 Family history of other endocrine, nutritional and metabolic diseases: Secondary | ICD-10-CM | POA: Insufficient documentation

## 2020-11-13 DIAGNOSIS — R293 Abnormal posture: Secondary | ICD-10-CM | POA: Diagnosis not present

## 2020-11-13 DIAGNOSIS — Z833 Family history of diabetes mellitus: Secondary | ICD-10-CM | POA: Diagnosis not present

## 2020-11-13 DIAGNOSIS — Z8049 Family history of malignant neoplasm of other genital organs: Secondary | ICD-10-CM | POA: Diagnosis not present

## 2020-11-13 DIAGNOSIS — Z79899 Other long term (current) drug therapy: Secondary | ICD-10-CM | POA: Diagnosis not present

## 2020-11-13 DIAGNOSIS — Z803 Family history of malignant neoplasm of breast: Secondary | ICD-10-CM | POA: Insufficient documentation

## 2020-11-13 NOTE — Therapy (Signed)
[facility] [address] [place], [place], [address] Phone: [phone]   Fax:  [phone]  Physical Therapy Evaluation  Patient Details  Name: Kelly George MRN: [id number] Date of Birth: 1945/11/10 Referring Provider (PT): Dr. Dwain Sarna   Encounter Date: 11/13/2020   PT End of Session - 11/13/20 2007    Visit Number 1    Number of Visits 2    Date for PT Re-Evaluation 01/09/21    PT Start Time 1401    PT Stop Time 1450    PT Time Calculation (min) 49 min    Activity Tolerance Patient tolerated treatment well    Behavior During Therapy [facility] for tasks assessed/performed           Past Medical History:  Diagnosis Date  . Anxiety   . Cardiac arrhythmia due to congenital heart disease   . Depression   . Heart murmur   . Hyperlipidemia   . Seasonal allergies     Past Surgical History:  Procedure Laterality Date  . BREAST EXCISIONAL BIOPSY Left   . COLONOSCOPY  07/07/01  . SKIN SURGERY     DERM - nasal   . TOOTH EXTRACTION    . TUBAL LIGATION      There were no vitals filed for this visit.    Subjective Assessment - 11/13/20 0810    Subjective Pt was diagnosed on December 23,2021 with Right breast CA.  She is  scheduled for Right mastectomy with SLNB on 11/28/2020 with Dr. Dwain Sarna. She is here for presurgical screening. Her daughter is here with her.    Patient is accompained by: Family member   daughter   Pertinent History Pt was diagnosed on December 23,2021 with Right breast CA.  She is  scheduled for Right mastectomy with SLNB on 11/28/2020 with Dr. Dwain Sarna. She is here for presurgical screening.  She lives on [address] and rides horses 4 days/week.    Patient Stated Goals Here for presurgical assessment    Currently in Pain? No/denies              [facility] PT Assessment - 11/13/20 0001      Assessment   Medical Diagnosis right breast CA    Referring Provider (PT) Dr. Dwain Sarna     Onset Date/Surgical Date 11/28/20    Hand Dominance Right      Precautions   Precautions --   will have lymphedema risk secondary to breast CA, OA,   Precaution Comments lymphedema risk      Restrictions   Weight Bearing Restrictions No    Other Position/Activity Restrictions no      Balance Screen   Has the patient fallen in the past 6 months No    Has the patient had a decrease in activity level because of a fear of falling?  No    Is the patient reluctant to leave their home because of a fear of falling?  No      Home Environment   Living Environment Private residence    Living Arrangements Alone    Available Help at Discharge Family      Prior Function   Level of Independence Independent    Vocation Retired    Leisure rides her horse      Cognition   Overall Cognitive Status Within Functional Limits for tasks assessed      Sensation   Light Touch Appears Intact      Posture/Postural Control   Posture/Postural Control  Postural limitations    Postural Limitations Rounded Shoulders;Forward head      AROM   Right Shoulder Extension 64 Degrees    Right Shoulder Flexion 150 Degrees    Right Shoulder ABduction 156 Degrees    Right Shoulder Internal Rotation 40 Degrees    Right Shoulder External Rotation 93 Degrees    Left Shoulder Extension 65 Degrees    Left Shoulder Flexion 151 Degrees    Left Shoulder ABduction 163 Degrees    Left Shoulder Internal Rotation 48 Degrees    Left Shoulder External Rotation 85 Degrees             LYMPHEDEMA/ONCOLOGY QUESTIONNAIRE - 11/13/20 0001      Type   Cancer Type Right breast CA      Surgeries   Mastectomy Date 11/28/20      Treatment   Active Chemotherapy Treatment No    Past Chemotherapy Treatment No    Active Radiation Treatment No    Past Radiation Treatment No    Current Hormone Treatment No    Past Hormone Therapy No      What other symptoms do you have   Are you Having Heaviness or Tightness No    Are you  having Pain No    Are you having pitting edema No    Is it Hard or Difficult finding clothes that fit No    Do you have infections No    Is there Decreased scar mobility No    [place]mer Sign No      Right Upper Extremity Lymphedema   10 cm Proximal to Olecranon Process 29.8 cm (P)     Olecranon Process 28 cm (P)     15 cm Proximal to Ulnar Styloid Process 23.9 cm (P)     10 cm Proximal to Ulnar Styloid Process 20 cm (P)     Just Proximal to Ulnar Styloid Process 16.6 cm (P)     Across Hand at [employer] 20.2 cm (P)     At [place] of 2nd Digit 7.3 cm (P)       Left Upper Extremity Lymphedema   15 cm Proximal to Olecranon Process 31.1 cm (P)     10 cm Proximal to Olecranon Process 30.2 cm (P)     Olecranon Process 28.2 cm (P)     15 cm Proximal to Ulnar Styloid Process 24 cm (P)     10 cm Proximal to Ulnar Styloid Process 20.3 cm (P)     Just Proximal to Ulnar Styloid Process 16.5 cm (P)     Across Hand at [employer] 20.6 cm (P)     At [place] of 2nd Digit 7 cm (P)            L-DEX FLOWSHEETS - 11/13/20 1400      L-DEX LYMPHEDEMA SCREENING   Measurement Type Unilateral    L-DEX MEASUREMENT EXTREMITY Upper Extremity    POSITION  Standing    DOMINANT SIDE Right    At Risk Side Right    BASELINE SCORE (UNILATERAL) 4.5                Objective measurements completed on examination: See above findings.               PT Education - 11/13/20 2005    Education Details pt was educated in 4 post op exercises including AA shoulder flexion in sitting/supine, ER in sitting/supine with hands behind head, scapular retraction, and Wall  walks for abd.  She was also educated in attending ABC class and appt was scheduled.  F/U appt here was also scheduled for 1 month after surgery.    Person(s) Educated Patient;daughter   Methods Explanation;Demonstration;Handout    Comprehension Verbalized understanding;Returned demonstration               PT Long Term Goals -  11/13/20 2015      PT LONG TERM GOAL #1   Title Pt will restore shoulder Right ROM to pre-op levels for improved ability to perform home and recreational activities    Time 6    Period Weeks    Status New    Target Date 12/18/20           Breast Clinic Goals - 11/13/20 3500      Patient will be able to verbalize understanding of pertinent lymphedema risk reduction practices relevant to her diagnosis specifically related to skin care.   Time 1    Period Days    Status Achieved    Target Date 11/13/20      Patient will be able to return demonstrate and/or verbalize understanding of the post-op home exercise program related to regaining shoulder range of motion.   Time 1    Period Days    Status Achieved    Target Date 11/13/20      Patient will be able to verbalize understanding of the importance of attending the postoperative After Breast Cancer Class for further lymphedema risk reduction education and therapeutic exercise.   Time 1    Period Days    Status Achieved    Target Date 11/13/20                 Plan - 11/13/20 2009    Clinical Impression Statement Pt was seen for pre-surgical screening.  She was instructed in a HEP to begin after drains are removed and as allowed by MD.  She was instructed in  Lymphedema and ways to prevent lymphedema.  She was also scheduled for the ABC class.  She had SOZO performed today as well to get a good baseline before sx.    Personal Factors and Comorbidities Comorbidity 2    Comorbidities Pending right breast mastectomy with SLNB, multi joint OA,    Stability/Clinical Decision Making Stable/Uncomplicated    Clinical Decision Making Low    Rehab Potential Excellent    PT Frequency 1x / week    PT Duration 6 weeks    PT Treatment/Interventions ADLs/Self Care Home Management;Therapeutic exercise;Patient/family education    PT Next Visit Plan Reassess after surgery, Determine need for further PT    PT Home Exercise Plan 4 post op  exs, ABC class    Consulted and Agree with Plan of Care Patient;Family member/caregiver           Patient will benefit from skilled therapeutic intervention in order to improve the following deficits and impairments:  Decreased knowledge of precautions,Postural dysfunction  Visit Diagnosis: Malignant neoplasm of upper-outer quadrant of right female breast, unspecified estrogen receptor status ([place])     Problem List Patient Active Problem List   Diagnosis Date Noted  . Malignant neoplasm of upper-outer quadrant of right breast in female, estrogen receptor positive ([place]) 11/10/2020  . GERD (gastroesophageal reflux disease) 04/03/2020  . Osteoarthritis of multiple joints 04/03/2020  . Viral warts 07/13/2019  . Aortic atherosclerosis ([place]) 12/03/2018  . Dyslipidemia 12/03/2018  . Precordial chest pain 07/07/2015  . Pain in joint of right  foot 05/16/2014  . Vitamin D deficiency 07/08/2013  . Mitral valve prolapse syndrome, history of mild 01/05/2013  . Seasonal allergies   . Anxiety   . Depression, recurrent ([place])   . Hyperlipidemia    Patient will follow up at outpatient cancer rehab 3-4 weeks following surgery.  If the patient requires physical therapy at that time, a specific plan will be dictated and sent to the referring physician for approval. The patient was educated today on appropriate basic range of motion exercises to begin post operatively and the importance of attending the After Breast Cancer class following surgery.  Patient was educated today on lymphedema risk reduction practices as it pertains to recommendations that will benefit the patient immediately following surgery.  She verbalized good understanding.     Claris Pong 11/13/2020, 8:17 PM  [place], [place], [address] Phone: [phone]   Fax:  [phone]  Name: Dustee Bottenfield MRN: [id number] Date of Birth:  [birth year] Cheral Almas, PT 11/13/20 8:23 PM

## 2020-11-13 NOTE — Assessment & Plan Note (Signed)
11/10/2020: Screening mammogram showed calcifications and a possible mass in the right breast. Diagnostic mammogram and US showed in the right breast, a 0.9cm cyst at the 8 o'clock position, a 1.0cm lesion at the 12 o'clock position, and no right axillary adenopathy. 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  Pathology and radiology counseling:Discussed with the patient, the details of pathology including the type of breast cancer,the clinical staging, the significance of ER, PR and HER-2/neu receptors and the implications for treatment. After reviewing the pathology in detail, we proceeded to discuss the different treatment options between surgery, radiation, chemotherapy, antiestrogen therapies.  Recommendations: 1. Breast conserving surgery followed by 2. Adjuvant radiation therapy followed by 4. Adjuvant antiestrogen therapy   Return to clinic after surgery to discuss final pathology report

## 2020-11-13 NOTE — Patient Instructions (Signed)
Patient was instructed today in a home exercise program today for post op shoulder range of motion. These included active assist shoulder flexion in sitting/supine, scapular retraction, wall walking with shoulder abduction, and hands behind head external rotation sitting/supie.  She was encouraged to do these twice a day, holding 3 seconds and repeating 5 times 2 times per day when allowed by MD         Lymphedema is a swelling condition that you may be at risk for in your arm if you have lymph nodes removed from the armpit area.  After a sentinel node biopsy, the risk is approximately 5-9% and is higher after an axillary node dissection.  There is treatment available for this condition and it is not life-threatening.  Contact your physician or physical therapist with concerns.  You may begin the 4 shoulder/posture exercises (see additional sheet) when permitted by your physician (typically a week after surgery).  If you have drains, you may need to wait until those are removed before beginning range of motion exercises.  A general recommendation is to not lift your arms above shoulder height until drains are removed.  These exercises should be done to your tolerance and gently.  This is not a "no pain/no gain" type of recovery so listen to your body and stretch into the range of motion that you can tolerate, stopping if you have pain.  If you are having immediate reconstruction, ask your plastic surgeon about doing exercises as he or she may want you to wait.  We encourage you to attend the free one time ABC (After Breast Cancer) class offered by [facility].  You will learn information related to lymphedema risk, prevention and treatment and additional exercises to regain mobility following surgery.  You can call [phone] for more information.  This is offered the 1st and 3rd Monday of each month.  You only attend the class one time.  While undergoing any medical procedure or  treatment, try to avoid blood pressure being taken or needle sticks from occurring on the arm on the side of cancer.   This recommendation begins after surgery and continues for the rest of your life.  This may help reduce your risk of getting lymphedema (swelling in your arm).  An excellent resource for those seeking information on lymphedema is the National Lymphedema Network's web site. It can be accessed at [place].org  If you notice swelling in your hand, arm or breast at any time following surgery (even if it is many years from now), please contact your doctor or physical therapist to discuss this.  Lymphedema can be treated at any time but it is easier for you if it is treated early on.  If you feel like your shoulder motion is not returning to normal in a reasonable amount of time, please contact your surgeon or physical therapist.  ABC CLASS After Breast Cancer Class  After Breast Cancer Class is a specially designed exercise class to assist you in a safe recover after having breast cancer surgery.  In this class you will learn how to get back to full function whether your drains were just removed or if you had surgery a month ago.  This one-time class is held the 1st and 3rd Monday of every month from 11:00 a.m. until 12:00 noon at the [facility] located at [place], [place] [address]  This class is FREE and space is limited. For more information or to  register for the next available class, call [phone].  Class Goals   Understand specific stretches to improve the flexibility of you chest and shoulder.  Learn ways to safely strengthen your upper body and improve your posture.  Understand the warning signs of infection and why you may be at risk for an arm infection.  Learn about Lymphedema and prevention.  ** You do not attend this class until after surgery.  Drains must be removed to participate  Patient was instructed  today in a home exercise program today for post op shoulder range of motion. These included active assist shoulder flexion in sitting, scapular retraction, wall walking with shoulder abduction, and hands behind head external rotation.  She was encouraged to do these twice a day, holding 3 seconds and repeating 5 times when permitted by her physician.

## 2020-11-14 ENCOUNTER — Encounter: Payer: Self-pay | Admitting: Hematology and Oncology

## 2020-11-14 ENCOUNTER — Encounter: Payer: Self-pay | Admitting: *Deleted

## 2020-11-14 HISTORY — PX: MASTECTOMY: SHX3

## 2020-11-15 ENCOUNTER — Telehealth: Payer: Self-pay | Admitting: Hematology and Oncology

## 2020-11-15 NOTE — Telephone Encounter (Signed)
Scheduled per 1/31 los. Called and spoke with pt, confirmed 3/1 appt

## 2020-11-21 ENCOUNTER — Other Ambulatory Visit: Payer: Self-pay

## 2020-11-21 ENCOUNTER — Encounter (HOSPITAL_BASED_OUTPATIENT_CLINIC_OR_DEPARTMENT_OTHER): Payer: Self-pay | Admitting: General Surgery

## 2020-11-21 DIAGNOSIS — D485 Neoplasm of uncertain behavior of skin: Secondary | ICD-10-CM | POA: Diagnosis not present

## 2020-11-23 ENCOUNTER — Encounter: Payer: Self-pay | Admitting: *Deleted

## 2020-11-23 NOTE — Progress Notes (Signed)

## 2020-11-24 ENCOUNTER — Other Ambulatory Visit (HOSPITAL_COMMUNITY)
Admission: RE | Admit: 2020-11-24 | Discharge: 2020-11-24 | Disposition: A | Discharge status: Home / Self Care | Payer: Medicare Other | Source: Ambulatory Visit | Attending: General Surgery | Admitting: General Surgery

## 2020-11-24 DIAGNOSIS — Z01812 Encounter for preprocedural laboratory examination: Secondary | ICD-10-CM | POA: Insufficient documentation

## 2020-11-24 DIAGNOSIS — Z20822 Contact with and (suspected) exposure to covid-19: Secondary | ICD-10-CM | POA: Insufficient documentation

## 2020-11-24 LAB — SARS CORONAVIRUS 2 (TAT 6-24 HRS): SARS Coronavirus 2: NEGATIVE

## 2020-11-28 ENCOUNTER — Ambulatory Visit (HOSPITAL_BASED_OUTPATIENT_CLINIC_OR_DEPARTMENT_OTHER): Payer: Medicare Other | Admitting: Certified Registered"

## 2020-11-28 ENCOUNTER — Other Ambulatory Visit: Payer: Self-pay

## 2020-11-28 ENCOUNTER — Encounter (HOSPITAL_BASED_OUTPATIENT_CLINIC_OR_DEPARTMENT_OTHER)
Admission: RE | Disposition: A | Discharge status: Home / Self Care | Payer: Self-pay | Source: Home / Self Care | Attending: General Surgery

## 2020-11-28 ENCOUNTER — Observation Stay (HOSPITAL_BASED_OUTPATIENT_CLINIC_OR_DEPARTMENT_OTHER)
Admission: RE | Admit: 2020-11-28 | Discharge: 2020-11-29 | Disposition: A | Discharge status: Home / Self Care | Payer: Medicare Other | Attending: General Surgery | Admitting: General Surgery

## 2020-11-28 ENCOUNTER — Ambulatory Visit (HOSPITAL_COMMUNITY)
Admission: RE | Admit: 2020-11-28 | Discharge: 2020-11-28 | Disposition: A | Discharge status: Home / Self Care | Payer: Medicare Other | Source: Ambulatory Visit | Attending: General Surgery | Admitting: General Surgery

## 2020-11-28 ENCOUNTER — Encounter (HOSPITAL_BASED_OUTPATIENT_CLINIC_OR_DEPARTMENT_OTHER): Payer: Self-pay | Admitting: General Surgery

## 2020-11-28 DIAGNOSIS — C50911 Malignant neoplasm of unspecified site of right female breast: Secondary | ICD-10-CM | POA: Diagnosis not present

## 2020-11-28 DIAGNOSIS — C50411 Malignant neoplasm of upper-outer quadrant of right female breast: Secondary | ICD-10-CM | POA: Diagnosis not present

## 2020-11-28 DIAGNOSIS — E559 Vitamin D deficiency, unspecified: Secondary | ICD-10-CM | POA: Diagnosis not present

## 2020-11-28 DIAGNOSIS — N6310 Unspecified lump in the right breast, unspecified quadrant: Secondary | ICD-10-CM | POA: Diagnosis present

## 2020-11-28 DIAGNOSIS — Z170 Estrogen receptor positive status [ER+]: Secondary | ICD-10-CM | POA: Diagnosis not present

## 2020-11-28 DIAGNOSIS — N6489 Other specified disorders of breast: Secondary | ICD-10-CM | POA: Diagnosis not present

## 2020-11-28 DIAGNOSIS — G8918 Other acute postprocedural pain: Secondary | ICD-10-CM | POA: Diagnosis not present

## 2020-11-28 HISTORY — DX: Unspecified right bundle-branch block: I45.10

## 2020-11-28 HISTORY — PX: MASTECTOMY W/ SENTINEL NODE BIOPSY: SHX2001

## 2020-11-28 SURGERY — MASTECTOMY WITH SENTINEL LYMPH NODE BIOPSY
Anesthesia: General | Site: Breast | Laterality: Right

## 2020-11-28 MED ORDER — FENTANYL CITRATE (PF) 100 MCG/2ML IJ SOLN
INTRAMUSCULAR | Status: AC
  Filled 2020-11-28: qty 2

## 2020-11-28 MED ORDER — FENTANYL CITRATE (PF) 100 MCG/2ML IJ SOLN
INTRAMUSCULAR | Status: DC | PRN
  Administered 2020-11-28 (×3): 50 ug via INTRAVENOUS
  Administered 2020-11-28: 25 ug via INTRAVENOUS

## 2020-11-28 MED ORDER — TRAMADOL HCL 50 MG PO TABS
50.0000 mg | ORAL_TABLET | Freq: Four times a day (QID) | ORAL | Status: DC | PRN
  Administered 2020-11-28: 50 mg via ORAL
  Filled 2020-11-28: qty 1

## 2020-11-28 MED ORDER — DEXAMETHASONE SODIUM PHOSPHATE 4 MG/ML IJ SOLN
INTRAMUSCULAR | Status: DC | PRN
  Administered 2020-11-28: 5 mg via INTRAVENOUS

## 2020-11-28 MED ORDER — MORPHINE SULFATE (PF) 4 MG/ML IV SOLN: 1.0000 mg | INTRAVENOUS | Status: DC | PRN

## 2020-11-28 MED ORDER — FENTANYL CITRATE (PF) 100 MCG/2ML IJ SOLN
50.0000 ug | Freq: Once | INTRAMUSCULAR | Status: AC
Start: 2020-11-28 — End: 2020-11-28
  Administered 2020-11-28: 50 ug via INTRAVENOUS

## 2020-11-28 MED ORDER — TECHNETIUM TC 99M TILMANOCEPT KIT
1.0000 | PACK | Freq: Once | INTRAVENOUS | Status: AC | PRN
  Administered 2020-11-28: 1 via INTRADERMAL

## 2020-11-28 MED ORDER — GLYCOPYRROLATE 0.2 MG/ML IJ SOLN
INTRAMUSCULAR | Status: DC | PRN
  Administered 2020-11-28: .2 mg via INTRAVENOUS

## 2020-11-28 MED ORDER — ONDANSETRON HCL 4 MG/2ML IJ SOLN
INTRAMUSCULAR | Status: AC
  Filled 2020-11-28: qty 2

## 2020-11-28 MED ORDER — BUPIVACAINE-EPINEPHRINE (PF) 0.5% -1:200000 IJ SOLN
INTRAMUSCULAR | Status: DC | PRN
  Administered 2020-11-28: 30 mL via PERINEURAL

## 2020-11-28 MED ORDER — PROPOFOL 10 MG/ML IV BOLUS
INTRAVENOUS | Status: DC | PRN
  Administered 2020-11-28: 150 mg via INTRAVENOUS

## 2020-11-28 MED ORDER — SERTRALINE HCL 50 MG PO TABS: 50.0000 mg | ORAL_TABLET | Freq: Every day | ORAL | Status: DC

## 2020-11-28 MED ORDER — ENSURE PRE-SURGERY PO LIQD: 296.0000 mL | Freq: Once | ORAL | Status: DC

## 2020-11-28 MED ORDER — TRAMADOL HCL 50 MG PO TABS: 50.0000 mg | ORAL_TABLET | Freq: Four times a day (QID) | ORAL | 0 refills | Status: DC | PRN

## 2020-11-28 MED ORDER — ACETAMINOPHEN 325 MG RE SUPP: 650.0000 mg | Freq: Four times a day (QID) | RECTAL | Status: DC | PRN

## 2020-11-28 MED ORDER — ACETAMINOPHEN 500 MG PO TABS
ORAL_TABLET | ORAL | Status: AC
  Filled 2020-11-28: qty 2

## 2020-11-28 MED ORDER — LACTATED RINGERS IV SOLN: INTRAVENOUS | Status: DC

## 2020-11-28 MED ORDER — FENTANYL CITRATE (PF) 100 MCG/2ML IJ SOLN
25.0000 ug | INTRAMUSCULAR | Status: DC | PRN
  Administered 2020-11-28 (×2): 50 ug via INTRAVENOUS

## 2020-11-28 MED ORDER — LIDOCAINE 2% (20 MG/ML) 5 ML SYRINGE
INTRAMUSCULAR | Status: DC | PRN
  Administered 2020-11-28: 60 mg via INTRAVENOUS

## 2020-11-28 MED ORDER — CEFAZOLIN SODIUM-DEXTROSE 2-4 GM/100ML-% IV SOLN
INTRAVENOUS | Status: AC
  Filled 2020-11-28: qty 100

## 2020-11-28 MED ORDER — ACETAMINOPHEN 325 MG PO TABS
650.0000 mg | ORAL_TABLET | Freq: Four times a day (QID) | ORAL | Status: DC | PRN
  Administered 2020-11-28: 650 mg via ORAL
  Filled 2020-11-28: qty 2

## 2020-11-28 MED ORDER — DEXAMETHASONE SODIUM PHOSPHATE 10 MG/ML IJ SOLN
INTRAMUSCULAR | Status: DC | PRN
  Administered 2020-11-28: 10 mg

## 2020-11-28 MED ORDER — METHOCARBAMOL 750 MG PO TABS: 750.0000 mg | ORAL_TABLET | Freq: Three times a day (TID) | ORAL | 0 refills | Status: DC | PRN

## 2020-11-28 MED ORDER — MIDAZOLAM HCL 2 MG/2ML IJ SOLN
INTRAMUSCULAR | Status: AC
  Filled 2020-11-28: qty 2

## 2020-11-28 MED ORDER — ONDANSETRON HCL 4 MG/2ML IJ SOLN
INTRAMUSCULAR | Status: DC | PRN
  Administered 2020-11-28: 4 mg via INTRAVENOUS

## 2020-11-28 MED ORDER — ONDANSETRON HCL 4 MG/2ML IJ SOLN: 4.0000 mg | Freq: Once | INTRAMUSCULAR | Status: DC | PRN

## 2020-11-28 MED ORDER — ONDANSETRON HCL 4 MG/2ML IJ SOLN: 4.0000 mg | Freq: Four times a day (QID) | INTRAMUSCULAR | Status: DC | PRN

## 2020-11-28 MED ORDER — SODIUM CHLORIDE 0.9 % IV SOLN: INTRAVENOUS | Status: DC

## 2020-11-28 MED ORDER — SIMETHICONE 80 MG PO CHEW: 40.0000 mg | CHEWABLE_TABLET | Freq: Four times a day (QID) | ORAL | Status: DC | PRN

## 2020-11-28 MED ORDER — PROPOFOL 500 MG/50ML IV EMUL
INTRAVENOUS | Status: DC | PRN
  Administered 2020-11-28: 125 ug/kg/min via INTRAVENOUS

## 2020-11-28 MED ORDER — LIDOCAINE 2% (20 MG/ML) 5 ML SYRINGE
INTRAMUSCULAR | Status: AC
  Filled 2020-11-28: qty 5

## 2020-11-28 MED ORDER — CEFAZOLIN SODIUM-DEXTROSE 2-4 GM/100ML-% IV SOLN
2.0000 g | INTRAVENOUS | Status: AC
  Administered 2020-11-28: 2 g via INTRAVENOUS

## 2020-11-28 MED ORDER — METHOCARBAMOL 500 MG PO TABS
500.0000 mg | ORAL_TABLET | Freq: Four times a day (QID) | ORAL | Status: DC | PRN
  Administered 2020-11-28 (×2): 500 mg via ORAL
  Filled 2020-11-28 (×2): qty 1

## 2020-11-28 MED ORDER — ONDANSETRON 4 MG PO TBDP: 4.0000 mg | ORAL_TABLET | Freq: Four times a day (QID) | ORAL | Status: DC | PRN

## 2020-11-28 MED ORDER — EPHEDRINE SULFATE-NACL 50-0.9 MG/10ML-% IV SOSY
PREFILLED_SYRINGE | INTRAVENOUS | Status: DC | PRN
  Administered 2020-11-28: 10 mg via INTRAVENOUS

## 2020-11-28 MED ORDER — ACETAMINOPHEN 500 MG PO TABS
1000.0000 mg | ORAL_TABLET | ORAL | Status: AC
  Administered 2020-11-28: 1000 mg via ORAL

## 2020-11-28 SURGICAL SUPPLY — 62 items
ADH SKN CLS APL DERMABOND .7 (GAUZE/BANDAGES/DRESSINGS) ×2
APL PRP STRL LF DISP 70% ISPRP (MISCELLANEOUS) ×1
APPLIER CLIP 9.375 MED OPEN (MISCELLANEOUS) ×3
APR CLP MED 9.3 20 MLT OPN (MISCELLANEOUS) ×1
BINDER BREAST LRG (GAUZE/BANDAGES/DRESSINGS) IMPLANT
BINDER BREAST XLRG (GAUZE/BANDAGES/DRESSINGS) ×2 IMPLANT
BINDER BREAST XXLRG (GAUZE/BANDAGES/DRESSINGS) IMPLANT
BIOPATCH RED 1 DISK 7.0 (GAUZE/BANDAGES/DRESSINGS) ×1 IMPLANT
BIOPATCH RED 1IN DISK 7.0MM (GAUZE/BANDAGES/DRESSINGS) ×1
BLADE HEX COATED 2.75 (ELECTRODE) ×3 IMPLANT
BLADE SURG 10 STRL SS (BLADE) ×3 IMPLANT
BLADE SURG 15 STRL LF DISP TIS (BLADE) ×1 IMPLANT
BLADE SURG 15 STRL SS (BLADE) ×3
CANISTER SUCT 1200ML W/VALVE (MISCELLANEOUS) ×3 IMPLANT
CHLORAPREP W/TINT 26 (MISCELLANEOUS) ×3 IMPLANT
CLIP APPLIE 9.375 MED OPEN (MISCELLANEOUS) IMPLANT
CLOSURE WOUND 1/2 X4 (GAUZE/BANDAGES/DRESSINGS) ×1
COVER BACK TABLE 60X90IN (DRAPES) ×3 IMPLANT
COVER MAYO STAND STRL (DRAPES) ×3 IMPLANT
COVER PROBE W GEL 5X96 (DRAPES) ×3 IMPLANT
DERMABOND ADVANCED (GAUZE/BANDAGES/DRESSINGS) ×4
DERMABOND ADVANCED .7 DNX12 (GAUZE/BANDAGES/DRESSINGS) ×1 IMPLANT
DRAIN CHANNEL 19F RND (DRAIN) ×3 IMPLANT
DRAPE GAMMA PROBE CRDLSS 10X38 (DRAPES) ×2 IMPLANT
DRAPE TOP ARMCOVERS (MISCELLANEOUS) ×3 IMPLANT
DRAPE U-SHAPE 76X120 STRL (DRAPES) ×3 IMPLANT
DRAPE UTILITY XL STRL (DRAPES) ×3 IMPLANT
DRSG PAD ABDOMINAL 8X10 ST (GAUZE/BANDAGES/DRESSINGS) ×3 IMPLANT
DRSG TEGADERM 4X4.75 (GAUZE/BANDAGES/DRESSINGS) ×2 IMPLANT
ELECT BLADE 4.0 EZ CLEAN MEGAD (MISCELLANEOUS) ×3
ELECT REM PT RETURN 9FT ADLT (ELECTROSURGICAL) ×3
ELECTRODE BLDE 4.0 EZ CLN MEGD (MISCELLANEOUS) IMPLANT
ELECTRODE REM PT RTRN 9FT ADLT (ELECTROSURGICAL) ×1 IMPLANT
EVACUATOR SILICONE 100CC (DRAIN) ×3 IMPLANT
GAUZE SPONGE 4X4 12PLY STRL LF (GAUZE/BANDAGES/DRESSINGS) ×4 IMPLANT
GLOVE SURG ENC MOIS LTX SZ6.5 (GLOVE) ×6 IMPLANT
GLOVE SURG ENC MOIS LTX SZ7 (GLOVE) ×5 IMPLANT
GLOVE SURG UNDER POLY LF SZ7 (GLOVE) ×2 IMPLANT
GLOVE SURG UNDER POLY LF SZ7.5 (GLOVE) ×3 IMPLANT
GOWN STRL REUS W/ TWL LRG LVL3 (GOWN DISPOSABLE) ×3 IMPLANT
GOWN STRL REUS W/TWL LRG LVL3 (GOWN DISPOSABLE) ×12
HEMOSTAT ARISTA ABSORB 3G PWDR (HEMOSTASIS) ×3 IMPLANT
NS IRRIG 1000ML POUR BTL (IV SOLUTION) ×3 IMPLANT
PACK BASIN DAY SURGERY FS (CUSTOM PROCEDURE TRAY) ×3 IMPLANT
PENCIL SMOKE EVACUATOR (MISCELLANEOUS) ×3 IMPLANT
PIN SAFETY STERILE (MISCELLANEOUS) ×3 IMPLANT
RETRACTOR ONETRAX LX 90X20 (MISCELLANEOUS) ×2 IMPLANT
SLEEVE SCD COMPRESS KNEE MED (MISCELLANEOUS) ×3 IMPLANT
SPONGE LAP 18X18 RF (DISPOSABLE) ×6 IMPLANT
STRIP CLOSURE SKIN 1/2X4 (GAUZE/BANDAGES/DRESSINGS) ×1 IMPLANT
SUT ETHILON 2 0 FS 18 (SUTURE) ×3 IMPLANT
SUT MNCRL AB 4-0 PS2 18 (SUTURE) ×2 IMPLANT
SUT SILK 2 0 SH (SUTURE) ×3 IMPLANT
SUT VIC AB 2-0 SH 27 (SUTURE) ×3
SUT VIC AB 2-0 SH 27XBRD (SUTURE) ×2 IMPLANT
SUT VIC AB 3-0 SH 27 (SUTURE) ×3
SUT VIC AB 3-0 SH 27X BRD (SUTURE) IMPLANT
SUT VICRYL 3-0 CR8 SH (SUTURE) ×3 IMPLANT
TOWEL GREEN STERILE FF (TOWEL DISPOSABLE) ×6 IMPLANT
TUBE CONNECTING 20'X1/4 (TUBING) ×1
TUBE CONNECTING 20X1/4 (TUBING) ×2 IMPLANT
YANKAUER SUCT BULB TIP NO VENT (SUCTIONS) ×3 IMPLANT

## 2020-11-28 NOTE — Anesthesia Postprocedure Evaluation (Signed)
Anesthesia Post Note  Patient: Kelly George  Procedure(s) Performed: RIGHT MASTECTOMY WITH RIGHT AXILLARY SENTINEL LYMPH NODE BIOPSY (Right Breast)     Patient location during evaluation: PACU Anesthesia Type: General Level of consciousness: awake and alert Pain management: pain level controlled Vital Signs Assessment: post-procedure vital signs reviewed and stable Respiratory status: spontaneous breathing, nonlabored ventilation, respiratory function stable and patient connected to nasal cannula oxygen Cardiovascular status: blood pressure returned to baseline and stable Postop Assessment: no apparent nausea or vomiting Anesthetic complications: no   No complications documented.  Last Vitals:  Vitals:   11/28/20 1345 11/28/20 1415  BP: (!) 142/78 (!) 144/85  Pulse: 76 72  Resp: 14 16  Temp:  36.4 C  SpO2: 96% 97%    Last Pain:  Vitals:   11/28/20 1500  TempSrc:   PainSc: 0-No pain                 Catalina Gravel

## 2020-11-28 NOTE — Transfer of Care (Signed)
Immediate Anesthesia Transfer of Care Note  Patient: Kelly George  Procedure(s) Performed: RIGHT MASTECTOMY WITH RIGHT AXILLARY SENTINEL LYMPH NODE BIOPSY (Right Breast)  Patient Location: PACU  Anesthesia Type:General  Level of Consciousness: awake  Airway & Oxygen Therapy: Patient Spontanous Breathing and Patient connected to face mask oxygen  Post-op Assessment: Report given to RN and Post -op Vital signs reviewed and stable  Post vital signs: Reviewed and stable  Last Vitals:  Vitals Value Taken Time  BP    Temp    Pulse 76 11/28/20 1316  Resp    SpO2 100 % 11/28/20 1316  Vitals shown include unvalidated device data.  Last Pain:  Vitals:   11/28/20 1027  TempSrc: Oral  PainSc: 0-No pain         Complications: No complications documented.

## 2020-11-28 NOTE — Anesthesia Preprocedure Evaluation (Signed)
Anesthesia Evaluation  Patient identified by MRN, date of birth, ID band Patient awake    Reviewed: Allergy & Precautions, NPO status , Patient's Chart, lab work & pertinent test results  Airway Mallampati: II  TM Distance: >3 FB Neck ROM: Full    Dental  (+) Teeth Intact, Dental Advisory Given   Pulmonary neg pulmonary ROS,    Pulmonary exam normal breath sounds clear to auscultation       Cardiovascular Normal cardiovascular exam+ dysrhythmias (RBBB)  Rhythm:Regular Rate:Normal     Neuro/Psych PSYCHIATRIC DISORDERS Anxiety Depression negative neurological ROS     GI/Hepatic Neg liver ROS, GERD  ,  Endo/Other  negative endocrine ROS  Renal/GU negative Renal ROS     Musculoskeletal  (+) Arthritis ,   Abdominal   Peds  Hematology negative hematology ROS (+)   Anesthesia Other Findings Day of surgery medications reviewed with the patient.  RIGHT BREAST CANCER  Reproductive/Obstetrics                             Anesthesia Physical Anesthesia Plan  ASA: II  Anesthesia Plan: General   Post-op Pain Management:  Regional for Post-op pain   Induction: Intravenous  PONV Risk Score and Plan: 3 and Propofol infusion, Dexamethasone, Ondansetron and Treatment may vary due to age or medical condition  Airway Management Planned: LMA  Additional Equipment:   Intra-op Plan:   Post-operative Plan: Extubation in OR  Informed Consent: I have reviewed the patients History and Physical, chart, labs and discussed the procedure including the risks, benefits and alternatives for the proposed anesthesia with the patient or authorized representative who has indicated his/her understanding and acceptance.     Dental advisory given  Plan Discussed with: CRNA  Anesthesia Plan Comments:         Anesthesia Quick Evaluation

## 2020-11-28 NOTE — H&P (Signed)
75 yof with no prior breast history, lives in [place] has fh in mom in 34s. no mass and no discharge noted. she underwent a screening mm that showed a right breast mass or calcs. she has b-c density breasts. there are ruoq calcs measuring 1.3x2.7x1.4 cm and a mass that measures 1 cm in biggest dimension. the axilla is negative by [place]. the clips are 5.7 cm apart. 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 she is here with her daughter to discuss options today   Past Surgical History Illene Regulus, CMA; 11/09/2020 3:42 PM) Breast Biopsy  Bilateral.  Diagnostic Studies History Illene Regulus, CMA; 11/09/2020 3:42 PM) Colonoscopy  5-10 years ago Mammogram  within last year Pap Smear  >5 years ago  Allergies Sherron Ales Marshall-McBride, CMA; 11/09/2020 2:48 PM) Penicillins   Medication History (Kheana Marshall-McBride, CMA; 11/09/2020 2:48 PM) Sertraline HCl (50MG Tablet, Oral) Active. Simvastatin (10MG Tablet, Oral) Active.  Social History Illene Regulus, CMA; 11/09/2020 3:42 PM) Alcohol use  Moderate alcohol use. Caffeine use  Coffee. No drug use  Tobacco use  Never smoker.  Family History Illene Regulus, [place]; 11/09/2020 3:42 PM) Alcohol Abuse  Father, Son. Breast Cancer  Mother. Cancer  Father. Depression  Son. Diabetes Mellitus  Father. Heart Disease  Father. Migraine Headache  Daughter. Respiratory Condition  Mother.  Pregnancy / Birth History Illene Regulus, CMA; 11/09/2020 3:42 PM) Age at menarche  61 years. Age of menopause  60-60 Gravida  2 Irregular periods  Length (months) of breastfeeding  3-6 Maternal age  49-30 Para  2  Other Problems Illene Regulus, CMA; 11/09/2020 3:42 PM) Anxiety Disorder  Arthritis  Back Pain  Breast Cancer  Depression  Hypercholesterolemia  Lump In Breast  Melanoma     Review of Systems Lars Mage Spillers CMA; 11/09/2020 3:42 PM) General Not Present- Appetite Loss, Chills, Fatigue, Fever, Night Sweats, Weight Gain and Weight Loss. Skin Present- Change in Wart/Mole. Not Present- Dryness, Hives, Jaundice, New Lesions, Non-Healing Wounds, Rash and Ulcer. HEENT Present- Wears glasses/contact lenses. Not Present- Earache, Hearing Loss, Hoarseness, Nose Bleed, Oral Ulcers, Ringing in the Ears, Seasonal Allergies, Sinus Pain, Sore Throat, Visual Disturbances and Yellow Eyes. Breast Present- Breast Mass. Not Present- Breast Pain, Nipple Discharge and Skin Changes. Gastrointestinal Not Present- Abdominal Pain, Bloating, Bloody Stool, Change in Bowel Habits, Chronic diarrhea, Constipation, Difficulty Swallowing, Excessive gas, Gets full quickly at meals, Hemorrhoids, Indigestion, Nausea, Rectal Pain and Vomiting. Female Genitourinary Not Present- Frequency, Nocturia, Painful Urination, Pelvic Pain and Urgency. Musculoskeletal Present- Back Pain and Joint Pain. Not Present- Joint Stiffness, Muscle Pain, Muscle Weakness and Swelling of Extremities. Neurological Not Present- Decreased Memory, Fainting, Headaches, Numbness, Seizures, Tingling, Tremor, Trouble walking and Weakness. Psychiatric Present- Anxiety and Depression. Not Present- Bipolar, Change in Sleep Pattern, Fearful and Frequent crying. Endocrine Not Present- Cold Intolerance, Excessive Hunger, Hair Changes, Heat Intolerance, Hot flashes and New Diabetes. Hematology Not Present- Blood Thinners, Easy Bruising, Excessive bleeding, Gland problems, HIV and Persistent Infections.  Vitals (Kheana Marshall-McBride CMA; 11/09/2020 2:49 PM) 11/09/2020 2:48 PM Weight: 185 lb Height: 67in Body Surface Area: 1.96 m Body Mass Index: 28.97 kg/m  Temp.: 63F  Pulse: 96 (Regular)  P.OX: 94% (Room air) BP: 118/78(Sitting, Left Arm, Standard) Physical Exam Rolm Bookbinder MD; 11/09/2020 8:30 PM) General Mental  Status-Alert. Orientation-Oriented X3. Breast Nipples-No Discharge. Breast Lump-No Palpable  Breast Mass. Lymphatic Head & Neck General Head & Neck Lymphatics: Bilateral - Description - Normal. Axillary General Axillary Region: Bilateral - Description - Normal. 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 We discussed all of the different options for treatment for breast cancer including surgery, chemotherapy, radiation therapy, Herceptin, and antiestrogen therapy. We discussed a sentinel lymph node biopsy as she does not appear to having lymph node involvement right now. We discussed the performance of that with injection of radioactive tracer. We discussed that there is a chance of having a positive node with a sentinel lymph node biopsy and we will await the permanent pathology to make any other first further decisions in terms of her treatment. We discussed up to a 5% risk lifetime of chronic shoulder pain as well as lymphedema associated with a sentinel lymph node biopsy. Sozo measurements pending We discussed the options for treatment of the breast cancer which included lumpectomy versus a mastectomy. We discussed the performance of a double lumpectomy with radioactive seed placement. We discussed a 5-10% chance of a positive margin. If we are going to consider this iI would send for MRI to make sure this isnt one large area. We also discussed that she will need radiation therapy if she undergoes lumpectomy. We discussed mastectomy and the postoperative care for that as well. Mastectomy can be followed by reconstruction. The decision for lumpectomy vs mastectomy has no impact on decision for chemotherapy. Most mastectomy patients will not need radiation therapy. We  discussed that there is no difference in her survival whether she undergoes lumpectomy with radiation therapy or antiestrogen therapy versus a mastectomy. There is also no real difference between her recurrence in the breast. she would like to avoid radiotherapy and proceed with mastectomy. she does not desire reconstruction We discussed the risks of operation including bleeding, infection, possible reoperation.

## 2020-11-28 NOTE — Progress Notes (Signed)
Nuclear medicine at bedside completing breast injections. Emotional support provided by RN. Patient tolerated well and vital signs remained stable.

## 2020-11-28 NOTE — Interval H&P Note (Signed)
History and Physical Interval Note:  11/28/2020 11:42 AM  Kelly George  has presented today for surgery, with the diagnosis of RIGHT BREAST CANCER.  The various methods of treatment have been discussed with the patient and family. After consideration of risks, benefits and other options for treatment, the patient has consented to  Procedure(s) with comments: RIGHT MASTECTOMY WITH RIGHT AXILLARY SENTINEL LYMPH NODE BIOPSY (Right) - RNFA, PEC BLOCK as a surgical intervention.  The patient's history has been reviewed, patient examined, no change in status, stable for surgery.  I have reviewed the patient's chart and labs.  Questions were answered to the patient's satisfaction.     Rolm Bookbinder

## 2020-11-28 NOTE — Anesthesia Procedure Notes (Signed)
Anesthesia Regional Block: Pectoralis block   Pre-Anesthetic Checklist: ,, timeout performed, Correct Patient, Correct Site, Correct Laterality, Correct Procedure, Correct Position, site marked, Risks and benefits discussed,  Surgical consent,  Pre-op evaluation,  At surgeon's request and post-op pain management  Laterality: Right  Prep: chloraprep       Needles:  Injection technique: Single-shot  Needle Type: Echogenic Needle     Needle Length: 9cm  Needle Gauge: 21     Additional Needles:   Procedures:,,,, ultrasound used (permanent image in chart),,,,  Narrative:  Start time: 11/28/2020 10:36 AM End time: 11/28/2020 10:41 AM Injection made incrementally with aspirations every 5 mL.  Performed by: Personally  Anesthesiologist: Catalina Gravel, MD  Additional Notes: No pain on injection. No increased resistance to injection. Injection made in 5cc increments.  Good needle visualization.  Patient tolerated procedure well.

## 2020-11-28 NOTE — Progress Notes (Signed)
Assisted Dr. Gifford Shave with right, ultrasound guided, pectoralis block. Side rails up, monitors on throughout procedure. See vital signs in flow sheet. Tolerated Procedure well.

## 2020-11-28 NOTE — Anesthesia Procedure Notes (Signed)
Procedure Name: LMA Insertion Date/Time: 11/28/2020 12:07 PM Performed by: Eulas Post, Lafawn Lenoir W, CRNA Pre-anesthesia Checklist: Patient identified, Emergency Drugs available, Suction available and Patient being monitored Patient Re-evaluated:Patient Re-evaluated prior to induction Oxygen Delivery Method: Circle system utilized Preoxygenation: Pre-oxygenation with 100% oxygen Induction Type: IV induction Ventilation: Mask ventilation without difficulty LMA: LMA inserted LMA Size: 4.0 Number of attempts: 1 Placement Confirmation: positive ETCO2 and breath sounds checked- equal and bilateral Tube secured with: Tape Dental Injury: Teeth and Oropharynx as per pre-operative assessment

## 2020-11-28 NOTE — Op Note (Signed)
Preoperative diagnosis: right breast cancer Postoperative diagnosis: Same as above Procedure: 1.  Right total mastectomy 2.  Right  deep axillary sentinel lymph node biopsy Surgeon: Dr. Serita Grammes Anesthesia: General with a pectoral block Estimated blood loss: 20 cc Specimens: 1  right breast tissue marked short stitch superior, long stitch marks lateral contents 2.  right deep axillary sentinel lymph nodes with highest count of 188 Drains: 19 [place] Blake drain Complications: None Special count was correct completion Disposition to recovery stable condition  Indications:74 yof underwent a screening mm that showed a right breast mass and calcs.  there are ruoq calcs measuring 1.3x2.7x1.4 cm and a mass that measures 1 cm in biggest dimension. the axilla is negative by [place]. the clips are 5.7 cm apart. 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 We discussed options and she elected mastectomy.    Procedure: After informed consent was obtained the patient first underwent injection of Lymphoseek in the standard periareolar fashion.  She was given antibiotics.  SCDs were in place.  She was placed under general anesthesia without complication.  She was then prepped and draped in the standard sterile surgical fashion.  A surgical timeout was then performed.  I made a large elliptical incision to encompass the nipple areola\.   I then created flaps to the clavicle, parasternal area, inframammary fold, latissimus laterally.  I then removed the breast and the pectoralis fashion from the muscle.  I passed this off the table as a specimen.  The sentinel node was in the tail of the breast tissue.  I remove this separately and passed it off the table.  There were no other palpable nodes in the background radioactivity was less than 10.   I then obtained hemostasis.  A 19 [place]  Blake drain was placed and secured with a 2-0 nylon suture.  I then closed the incision with 3-0 Vicryl and 4-0 Monocryl.  Glue and Steri-Strips were applied.  A dressing was placed over the drain.  She tolerated this well was extubated transferred to recovery in stable condition.

## 2020-11-29 ENCOUNTER — Encounter (HOSPITAL_BASED_OUTPATIENT_CLINIC_OR_DEPARTMENT_OTHER): Payer: Self-pay | Admitting: Anesthesiology

## 2020-11-29 ENCOUNTER — Encounter (HOSPITAL_BASED_OUTPATIENT_CLINIC_OR_DEPARTMENT_OTHER): Payer: Self-pay | Admitting: General Surgery

## 2020-11-29 DIAGNOSIS — C50411 Malignant neoplasm of upper-outer quadrant of right female breast: Secondary | ICD-10-CM | POA: Diagnosis not present

## 2020-11-29 NOTE — Discharge Instructions (Signed)
[facility], [place] [phone]  MASTECTOMY: POST OP INSTRUCTIONS Take 400 mg of ibuprofen every 8 hours or 650 mg tylenol every 6 hours for next 72 hours then as needed. Use ice several times daily also. Always review your discharge instruction sheet given to you by the facility where your surgery was performed. IF YOU HAVE DISABILITY OR FAMILY LEAVE FORMS, YOU MUST BRING THEM TO THE OFFICE FOR PROCESSING.   DO NOT GIVE THEM TO YOUR DOCTOR. A prescription for pain medication may be given to you upon discharge.  Take your pain medication as prescribed, if needed.  If narcotic pain medicine is not needed, then you may take acetaminophen (Tylenol), naprosyn (Alleve) or ibuprofen (Advil) as needed. 1. Take your usually prescribed medications unless otherwise directed. 2. If you need a refill on your pain medication, please contact your pharmacy.  They will contact our office to request authorization.  Prescriptions will not be filled after 5pm or on week-ends. 3. You should follow a light diet the first few days after arrival home, such as soup and crackers, etc.  Resume your normal diet the day after surgery. 4. Most patients will experience some swelling and bruising on the chest and underarm.  Ice packs will help.  Swelling and bruising can take several days to resolve. Wear the binder day and night until you return to the office.  5. It is common to experience some constipation if taking pain medication after surgery.  Increasing fluid intake and taking a stool softener (such as Colace) will usually help or prevent this problem from occurring.  A mild laxative (Milk of Magnesia or Miralax) should be taken according to package instructions if there are no bowel movements after 48 hours. 6. Unless discharge instructions indicate otherwise, leave your bandage dry and in place until your next appointment in 3-5 days.  You may take a limited sponge bath.  No tube baths or showers until the  drains are removed.  You may have steri-strips (small skin tapes) in place directly over the incision.  These strips should be left on the skin for 7-10 days. If you have glue it will come off in next couple week.  Any sutures will be removed at an office visit 7. DRAINS:  If you have drains in place, it is important to keep a list of the amount of drainage produced each day in your drains.  Before leaving the hospital, you should be instructed on drain care.  Call our office if you have any questions about your drains. I will remove your drains when they put out less than 30 cc or ml for 2 consecutive days. 8. ACTIVITIES:  You may resume regular (light) daily activities beginning the next day--such as daily self-care, walking, climbing stairs--gradually increasing activities as tolerated.  You may have sexual intercourse when it is comfortable.  Refrain from any heavy lifting or straining until approved by your doctor. a. You may drive when you are no longer taking prescription pain medication, you can comfortably wear a seatbelt, and you can safely maneuver your car and apply brakes. b. RETURN TO WORK:  __________________________________________________________ 9. You should see your doctor in the office for a follow-up appointment approximately 3-5 days after your surgery.  Your doctor's nurse will typically make your follow-up appointment when she calls you with your pathology report.  Expect your pathology report 3-4business days after surgery. 10. OTHER INSTRUCTIONS: ______________________________________________________________________________________________ ____________________________________________________________________________________________ WHEN TO CALL YOUR DR [place]: 1. Fever over 101.0 2.  Nausea and/or vomiting 3. Extreme swelling or bruising 4. Continued bleeding from incision. 5. Increased pain, redness, or drainage from the incision. The clinic staff is available to answer your  questions during regular business hours.  Please don't hesitate to call and ask to speak to one of the nurses for clinical concerns.  If you have a medical emergency, go to the nearest emergency room or call 911.  A surgeon from [facility] is always on call at the hospital. [address], [place], [place], [place]  [address] ? [address], [place], [place]   [address] [phone] ? [phone] ? FAX [phone] Web site: [email or web address]    About my Jackson-Pratt Bulb Drain  What is a Jackson-Pratt bulb? A Jackson-Pratt is a soft, round device used to collect drainage. It is connected to a long, thin drainage catheter, which is held in place by one or two small stiches near your surgical incision site. When the bulb is squeezed, it forms a vacuum, forcing the drainage to empty into the bulb.  Emptying the Jackson-Pratt bulb- To empty the bulb: 1. Release the plug on the top of the bulb. 2. Pour the bulb's contents into a measuring container which your nurse will provide. 3. Record the time emptied and amount of drainage. Empty the drain(s) as often as your     doctor or nurse recommends.  Date                  Time                    Amount (Drain 1)                 Amount (Drain 2)  _____________________________________________________________________  _____________________________________________________________________  _____________________________________________________________________  _____________________________________________________________________  _____________________________________________________________________  _____________________________________________________________________  _____________________________________________________________________  _____________________________________________________________________  Squeezing the Jackson-Pratt Bulb- To squeeze the bulb: 1. Make sure the plug at the top of the bulb  is open. 2. Squeeze the bulb tightly in your fist. You will hear air squeezing from the bulb. 3. Replace the plug while the bulb is squeezed. 4. Use a safety pin to attach the bulb to your clothing. This will keep the catheter from     pulling at the bulb insertion site.  When to call your doctor- Call your doctor if:  Drain site becomes red, swollen or hot.  You have a fever greater than 101 degrees F.  There is oozing at the drain site.  Drain falls out (apply a guaze bandage over the drain hole and secure it with tape).  Drainage increases daily not related to activity patterns. (You will usually have more drainage when you are active than when you are resting.)  Drainage has a bad odor.   Regional Anesthesia Blocks  1. Numbness or the inability to move the "blocked" extremity may last from 3-48 hours after placement. The length of time depends on the medication injected and your individual response to the medication. If the numbness is not going away after 48 hours, call your surgeon.  2. The extremity that is blocked will need to be protected until the numbness is gone and the  Strength has returned. Because you cannot feel it, you will need to take extra care to avoid injury. Because it may be weak, you may have difficulty moving it or using it. You may not know what position it is in without looking at it while the block is in effect.  3. For  blocks in the legs and feet, returning to weight bearing and walking needs to be done carefully. You will need to wait until the numbness is entirely gone and the strength has returned. You should be able to move your leg and foot normally before you try and bear weight or walk. You will need someone to be with you when you first try to ensure you do not fall and possibly risk injury.  4. Bruising and tenderness at the needle site are common side effects and will resolve in a few days.  5. Persistent numbness or new problems with movement  should be communicated to the surgeon or the [facility] [phone] [place] [phone]).

## 2020-11-29 NOTE — Addendum Note (Signed)
Addendum  created 11/29/20 1125 by Lieutenant Diego, CRNA   Charge Capture section accepted, Flowsheet accepted

## 2020-11-29 NOTE — Discharge Summary (Signed)
Physician Discharge Summary  Patient ID: Kelly George MRN: [id number] DOB/AGE: 1946-05-28 75 y.o.  Admit date: 11/28/2020 Discharge date: 11/29/2020  Admission Diagnoses: Right breast cancer  Discharge Diagnoses:  Active Problems:   Breast cancer, right [facility])   Discharged Condition: good  Hospital Course: 37 yof s/p right mastectomy and sn biopsy. Doing well following am with expected drain output and will be discharged home  Consults: None  Significant Diagnostic Studies: none  Treatments: surgery: right mastectomy, sn biopsy  Discharge Exam: Blood pressure 118/70, pulse 62, temperature 98.5 F (36.9 C), resp. rate 16, height 5' 7.5" (1.715 m), weight 82.2 kg, SpO2 97 %. Right mastectomy without hematoma, drain serosang as expected  Disposition: Discharge disposition: 01-Home or Self Care        Allergies as of 11/29/2020      Reactions   Penicillins Rash   Tetracyclines & Related Palpitations      Medication List    TAKE these medications   calcium-vitamin D 500-200 MG-UNIT tablet Commonly known as: OSCAL WITH D Take 1 tablet by mouth daily.   fish oil-omega-3 fatty acids 1000 MG capsule Take 1,200 mg by mouth daily.   glucosamine-chondroitin 500-400 MG tablet Take 1 tablet by mouth 2 (two) times daily.   methocarbamol 750 MG tablet Commonly known as: ROBAXIN Take 1 tablet (750 mg total) by mouth every 8 (eight) hours as needed (use for muscle cramps/pain).   naproxen sodium 220 MG tablet Commonly known as: ALEVE Take 220 mg by mouth daily. TAKE TWO TABLETS ONCE DAILY   sertraline 50 MG tablet Commonly known as: ZOLOFT Take 1 tablet (50 mg total) by mouth daily.   simvastatin 10 MG tablet Commonly known as: ZOCOR TAKE 1 TABLET BY MOUTH EVERYDAY AT BEDTIME   traMADol 50 MG tablet Commonly known as: ULTRAM Take 1 tablet (50 mg total) by mouth every 6 (six) hours as needed.   vitamin C 1000 MG tablet Take 1,000 mg by mouth daily.    Vitamin D3 50 MCG (2000 UT) Tabs Take 5,000 Units by mouth daily.       Follow-up Information    Rolm Bookbinder, MD In 2 weeks.   Specialty: General Surgery Contact information: [address] [place] [address] [phone]               Signed: Rolm Bookbinder 11/29/2020, 8:09 AM

## 2020-12-04 ENCOUNTER — Encounter: Payer: Self-pay | Admitting: *Deleted

## 2020-12-05 LAB — SURGICAL PATHOLOGY

## 2020-12-06 ENCOUNTER — Ambulatory Visit: Payer: Medicare Other | Admitting: Family Medicine

## 2020-12-11 NOTE — Assessment & Plan Note (Signed)
11/10/2020: Screening mammogram showed calcifications and a possible mass in the right breast. Diagnostic mammogram and US showed in the right breast, a 0.9cm cyst at the 8 o'clock position, a 1.0cm lesion at the 12 o'clock position, and no right axillary adenopathy. 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  11/28/20: Rt mastectomy:Grade 2 ILC 1.1 cm and Grade 2 IDC 0.2 cm, 0/2 LN  Plan: Adjuvant antiestrogen therapy with anastrozole 1 mg daily x5 to 7 years  RTC in 3 months for SCP visit

## 2020-12-11 NOTE — Progress Notes (Signed)
 Patient Care Team: Burchette, Bruce W, MD as PCP - General (Family Medicine) Richardson, Kathy, MD (Obstetrics and Gynecology) Jaffe, Adam R, DO as Consulting Physician (Neurology) Martini, Keisha N, RN as Oncology Nurse Navigator Stuart, Dawn C, RN as Oncology Nurse Navigator  DIAGNOSIS:    ICD-10-CM   1. Malignant neoplasm of upper-outer quadrant of right breast in female, estrogen receptor positive (HCC)  C50.411    Z17.0     SUMMARY OF ONCOLOGIC HISTORY: Oncology History  Malignant neoplasm of upper-outer quadrant of right breast in female, estrogen receptor positive (HCC)  11/10/2020 Initial Diagnosis   Screening mammogram showed calcifications and a possible mass in the right breast. Diagnostic mammogram and US showed in the right breast, a 0.9cm cyst at the 8 o'clock position, a 1.0cm lesion at the 12 o'clock position, and no right axillary adenopathy. 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     CHIEF COMPLIANT: Follow-up of right breast cancer s/p mastectomy   INTERVAL HISTORY: Kelly George is a 75 y.o. with above-mentioned history of right breast cancer. 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 She presents to the clinic today to review the pathology report and further treatment.  She is healing and recovering very well from surgery.  She does not have any pain anymore.  ALLERGIES:  is allergic to penicillins and tetracyclines & related.  MEDICATIONS:  Current Outpatient  Medications  Medication Sig Dispense Refill  . Ascorbic Acid (VITAMIN C) 1000 MG tablet Take 1,000 mg by mouth daily.    . calcium-vitamin D (OSCAL WITH D) 500-200 MG-UNIT per tablet Take 1 tablet by mouth daily.    . Cholecalciferol (VITAMIN D3) 2000 UNITS TABS Take 5,000 Units by mouth daily.     . fish oil-omega-3 fatty acids 1000 MG capsule Take 1,200 mg by mouth daily.     . glucosamine-chondroitin 500-400 MG tablet Take 1 tablet by mouth 2 (two) times daily.     . methocarbamol (ROBAXIN) 750 MG tablet Take 1 tablet (750 mg total) by mouth every 8 (eight) hours as needed (use for muscle cramps/pain). 20 tablet 0  . naproxen sodium (ANAPROX) 220 MG tablet Take 220 mg by mouth daily. TAKE TWO TABLETS ONCE DAILY    . sertraline (ZOLOFT) 50 MG tablet Take 1 tablet (50 mg total) by mouth daily. 90 tablet 3  . simvastatin (ZOCOR) 10 MG tablet TAKE 1 TABLET BY MOUTH EVERYDAY AT BEDTIME 90 tablet 3  . traMADol (ULTRAM) 50 MG tablet Take 1 tablet (50 mg total) by mouth every 6 (six) hours as needed. 10 tablet 0   No current facility-administered medications for this visit.    PHYSICAL EXAMINATION: ECOG PERFORMANCE STATUS: 1 - Symptomatic but completely ambulatory  Vitals:   12/12/20 0943  BP: 129/76  Pulse: 71  Resp: 17  Temp: (!) 97.5 F (36.4 C)  SpO2: 98%   Filed Weights   12/12/20 0943  Weight: 184 lb 8 oz (83.7 kg)       LABORATORY DATA:  I   have reviewed the data as listed CMP Latest Ref Rng & Units 11/18/2019 07/13/2019 11/18/2018  Glucose 65 - 99 mg/dL 93 105(H) 111(H)  BUN 8 - 27 mg/dL _0 Creatinine 0.57 - 1.00 mg/dL 0.66 0.73 0.67  Sodium 134 - 144 mmol/L 140 139 142  Potassium 3.5 - 5.2 mmol/L 4.2 4.3 4.4  Chloride 96 - 106 mmol/L 100 101 103  CO2 20 - 29 mmol/L _1 Calcium 8.7 - 10.3 mg/dL 10.0 9.9 10.1  Total Protein 6.0 - 8.5 g/dL 7.0 7.0 6.3  Total Bilirubin 0.0 - 1.2 mg/dL 0.7 0.7 0.5  Alkaline Phos 39 - 117 IU/L 76 81 70  AST 0 - 40 IU/L _2 ALT 0 - 32 IU/L 24 33(H) 24    Lab Results  Component Value Date   WBC 4.0 11/18/2019   HGB 14.6 11/18/2019   HCT 43.4 11/18/2019   MCV 95 11/18/2019   PLT 262 11/18/2019   NEUTROABS 2.1 11/18/2019    ASSESSMENT & PLAN:  Malignant neoplasm of upper-outer quadrant of right breast in female, estrogen receptor positive ([place]) 11/10/2020: Screening mammogram showed calcifications and a possible mass in the right breast. Diagnostic mammogram and US showed in the right breast, a 0.9cm cyst at the 8 o'clock position, a 1.0cm lesion at the 12 o'clock position, and no right axillary adenopathy. 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  11/28/20: Rt mastectomy:Grade 2 ILC 1.1 cm and Grade 2 IDC 0.2 cm, 0/2 LN 2019: Bone density: T score -0.7: Normal She has a bone density appointment in June 2022.  Plan: Adjuvant antiestrogen therapy with anastrozole 1 mg daily x5 to 7 years starting 12/12/2020  Anastrozole counseling: We discussed the risks and benefits of anti-estrogen therapy with aromatase inhibitors. These include but not limited to insomnia, hot flashes, mood changes, vaginal dryness, bone density loss, and weight gain. We strongly believe that the benefits far outweigh the risks. Patient understands these risks and consented to starting treatment. Planned treatment duration is 5 years.  RTC in 4 months for SCP visit    No orders of the defined types were placed in this encounter.  The patient has a good understanding of the overall plan. she agrees with it. she will call with any problems that may develop before the next visit here.  Total time spent: 30 mins including face to face time and time spent for planning, charting and coordination of care  Rulon Eisenmenger, MD, MPH 12/12/2020  I, Cloyde Reams Dorshimer, am acting as scribe for Dr. Nicholas Lose.  I have reviewed the above documentation for accuracy and completeness, and I  agree with the above.

## 2020-12-12 ENCOUNTER — Inpatient Hospital Stay: Payer: Medicare Other | Attending: Hematology and Oncology | Admitting: Hematology and Oncology

## 2020-12-12 ENCOUNTER — Other Ambulatory Visit: Payer: Self-pay

## 2020-12-12 DIAGNOSIS — Z9011 Acquired absence of right breast and nipple: Secondary | ICD-10-CM | POA: Diagnosis not present

## 2020-12-12 DIAGNOSIS — C50411 Malignant neoplasm of upper-outer quadrant of right female breast: Secondary | ICD-10-CM | POA: Insufficient documentation

## 2020-12-12 DIAGNOSIS — Z79899 Other long term (current) drug therapy: Secondary | ICD-10-CM | POA: Diagnosis not present

## 2020-12-12 DIAGNOSIS — Z170 Estrogen receptor positive status [ER+]: Secondary | ICD-10-CM | POA: Insufficient documentation

## 2020-12-12 DIAGNOSIS — Z79811 Long term (current) use of aromatase inhibitors: Secondary | ICD-10-CM | POA: Insufficient documentation

## 2020-12-12 MED ORDER — ANASTROZOLE 1 MG PO TABS: 1.0000 mg | ORAL_TABLET | Freq: Every day | ORAL | 3 refills | Status: DC

## 2020-12-14 ENCOUNTER — Encounter: Payer: Self-pay | Admitting: *Deleted

## 2020-12-14 ENCOUNTER — Other Ambulatory Visit: Payer: Self-pay

## 2020-12-15 ENCOUNTER — Ambulatory Visit (INDEPENDENT_AMBULATORY_CARE_PROVIDER_SITE_OTHER): Payer: Medicare Other | Admitting: Family Medicine

## 2020-12-15 ENCOUNTER — Encounter: Payer: Self-pay | Admitting: Family Medicine

## 2020-12-15 VITALS — BP 112/70 | HR 68 | Ht 67.5 in | Wt 182.0 lb

## 2020-12-15 DIAGNOSIS — F339 Major depressive disorder, recurrent, unspecified: Secondary | ICD-10-CM | POA: Diagnosis not present

## 2020-12-15 DIAGNOSIS — E7800 Pure hypercholesterolemia, unspecified: Secondary | ICD-10-CM | POA: Diagnosis not present

## 2020-12-15 LAB — LIPID PANEL
Cholesterol: 172 mg/dL (ref 0–200)
HDL: 79.3 mg/dL (ref >39.00)
LDL Cholesterol: 82 mg/dL (ref 0–99)
NonHDL: 92.35
Total CHOL/HDL Ratio: 2
Triglycerides: 53 mg/dL (ref 0.0–149.0)
VLDL: 10.6 mg/dL (ref 0.0–40.0)

## 2020-12-15 LAB — COMPREHENSIVE METABOLIC PANEL
ALT: 19 U/L (ref 0–35)
AST: 18 U/L (ref 0–37)
Albumin: 4.2 g/dL (ref 3.5–5.2)
Alkaline Phosphatase: 66 U/L (ref 39–117)
BUN: 19 mg/dL (ref 6–23)
CO2: 29 mEq/L (ref 19–32)
Calcium: 9.7 mg/dL (ref 8.4–10.5)
Chloride: 104 mEq/L (ref 96–112)
Creatinine, Ser: 0.58 mg/dL (ref 0.40–1.20)
GFR: 87.83 mL/min (ref >60.00)
Glucose, Bld: 98 mg/dL (ref 70–99)
Potassium: 5 mEq/L (ref 3.5–5.1)
Sodium: 140 mEq/L (ref 135–145)
Total Bilirubin: 0.7 mg/dL (ref 0.2–1.2)
Total Protein: 6.6 g/dL (ref 6.0–8.3)

## 2020-12-15 NOTE — Progress Notes (Signed)
Established Patient Office Visit  Subjective:  Patient ID: Kelly George, female    DOB: March 29, 1946  Age: 75 y.o. MRN: [id number]  CC:  Chief Complaint  Patient presents with  . Follow-up    HPI Kelly George presents for medical follow-up.  She went for routine screening mammogram in December and had concerning mass.  Diagnosed with breast cancer.  Estrogen receptor positive.  She just started on anastrozole.  Generally feels well.  She has history of recurrent depression.  Currently on sertraline 50 mg daily.  She feels her depression is stable.  She has hyperlipidemia treated with simvastatin 10 mg daily.  No significant myalgias.  Her father had CAD history with MI in his 60s.  Mother had breast cancer history.  She enjoys horseback riding and continues to ride frequently.  Past Medical History:  Diagnosis Date  . Anxiety   . Depression   . Heart murmur   . Hyperlipidemia   . RBBB   . Seasonal allergies     Past Surgical History:  Procedure Laterality Date  . BREAST EXCISIONAL BIOPSY Left   . COLONOSCOPY  07/07/01  . MASTECTOMY W/ SENTINEL NODE BIOPSY Right 11/28/2020   Procedure: RIGHT MASTECTOMY WITH RIGHT AXILLARY SENTINEL LYMPH NODE BIOPSY;  Surgeon: Rolm Bookbinder, MD;  Location: [facility];  Service: General;  Laterality: Right;  RNFA, PEC BLOCK  . SKIN SURGERY     DERM - nasal   . TOOTH EXTRACTION    . TUBAL LIGATION      Family History  Problem Relation Age of Onset  . Breast cancer Mother 60  . COPD Mother   . Diabetes Father   . Cancer Father        Brain  . Heart attack Father 55  . Alcohol abuse Father   . Depression Father   . Uterine cancer Maternal Aunt   . Hearing loss Maternal Grandmother   . Hypertension Maternal Grandfather   . Kidney disease Maternal Grandfather   . Stroke Maternal Grandfather   . High Cholesterol Brother   . Hypertension Brother   . Hyperlipidemia Brother   . Autoimmune disease Daughter    . Ankylosing spondylitis Daughter   . Healthy Son   . Depression Son     Social History   Socioeconomic History  . Marital status: Divorced    Spouse name: Not on file  . Number of children: 2  . Years of education: Not on file  . Highest education level: Associate degree: academic program  Occupational History  . Occupation: Retired  Tobacco Use  . Smoking status: Never Smoker  . Smokeless tobacco: Never Used  Vaping Use  . Vaping Use: Never used  Substance and Sexual Activity  . Alcohol use: Yes    Alcohol/week: 22.0 standard drinks    Types: 8 Glasses of wine, 14 Cans of beer per week    Comment: daily  . Drug use: No  . Sexual activity: Not Currently  Other Topics Concern  . Not on file  Social History Narrative   Live alone.     Patient is right-handed. She lives alone in a 1 story house, 3-4 steps to enter.    Social Determinants of Health   Financial Resource Strain: Low Risk   . Difficulty of Paying Living Expenses: Not hard at all  Food Insecurity: No Food Insecurity  . Worried About Charity fundraiser in the Last Year: Never true  . Ran Out  of Food in the Last Year: Never true  Transportation Needs: No Transportation Needs  . Lack of Transportation (Medical): No  . Lack of Transportation (Non-Medical): No  Physical Activity: Inactive  . Days of Exercise per Week: 0 days  . Minutes of Exercise per Session: 0 min  Stress: No Stress Concern Present  . Feeling of Stress : Not at all  Social Connections: Socially Isolated  . Frequency of Communication with Friends and Family: More than three times a week  . Frequency of Social Gatherings with Friends and Family: Three times a week  . Attends Religious Services: Never  . Active Member of Clubs or Organizations: No  . Attends Archivist Meetings: Never  . Marital Status: Divorced  Human resources officer Violence: Not At Risk  . Fear of Current or Ex-Partner: No  . Emotionally Abused: No  .  Physically Abused: No  . Sexually Abused: No    Outpatient Medications Prior to Visit  Medication Sig Dispense Refill  . anastrozole (ARIMIDEX) 1 MG tablet Take 1 tablet (1 mg total) by mouth daily. 90 tablet 3  . Ascorbic Acid (VITAMIN C) 1000 MG tablet Take 1,000 mg by mouth daily.    . calcium-vitamin D (OSCAL WITH D) 500-200 MG-UNIT per tablet Take 1 tablet by mouth daily.    . Cholecalciferol (VITAMIN D3) 2000 UNITS TABS Take 5,000 Units by mouth daily.     . fish oil-omega-3 fatty acids 1000 MG capsule Take 1,200 mg by mouth daily.     Marland Kitchen glucosamine-chondroitin 500-400 MG tablet Take 1 tablet by mouth 2 (two) times daily.     . methocarbamol (ROBAXIN) 750 MG tablet Take 1 tablet (750 mg total) by mouth every 8 (eight) hours as needed (use for muscle cramps/pain). 20 tablet 0  . naproxen sodium (ANAPROX) 220 MG tablet Take 220 mg by mouth daily. TAKE TWO TABLETS ONCE DAILY    . simvastatin (ZOCOR) 10 MG tablet TAKE 1 TABLET BY MOUTH EVERYDAY AT BEDTIME 90 tablet 3  . traMADol (ULTRAM) 50 MG tablet Take 1 tablet (50 mg total) by mouth every 6 (six) hours as needed. 10 tablet 0  . TURMERIC PO Take by mouth.    . sertraline (ZOLOFT) 50 MG tablet Take 1 tablet (50 mg total) by mouth daily. 90 tablet 3   No facility-administered medications prior to visit.    Allergies  Allergen Reactions  . Penicillins Rash  . Tetracyclines & Related Palpitations    ROS Review of Systems  Constitutional: Negative for fatigue.  Eyes: Negative for visual disturbance.  Respiratory: Negative for cough, chest tightness, shortness of breath and wheezing.   Cardiovascular: Negative for chest pain, palpitations and leg swelling.  Neurological: Negative for dizziness, seizures, syncope, weakness, light-headedness and headaches.      Objective:    Physical Exam Constitutional:      Appearance: She is well-developed and well-nourished.  Eyes:     Pupils: Pupils are equal, round, and reactive to  light.  Neck:     Thyroid: No thyromegaly.     Vascular: No JVD.  Cardiovascular:     Rate and Rhythm: Normal rate and regular rhythm.     Heart sounds: No gallop.   Pulmonary:     Effort: Pulmonary effort is normal. No respiratory distress.     Breath sounds: Normal breath sounds. No wheezing or rales.  Musculoskeletal:        General: No edema.     Cervical back: Neck supple.  Neurological:     Mental Status: She is alert.     BP 112/70   Pulse 68   Ht 5' 7.5" (1.715 m)   Wt 182 lb (82.6 kg)   SpO2 97%   BMI 28.08 kg/m  Wt Readings from Last 3 Encounters:  12/15/20 182 lb (82.6 kg)  12/12/20 184 lb 8 oz (83.7 kg)  11/28/20 181 lb 3.5 oz (82.2 kg)     Health Maintenance Due  Topic Date Due  . Hepatitis C Screening  Never done    There are no preventive care reminders to display for this patient.  Lab Results  Component Value Date   TSH 2.150 11/18/2019   Lab Results  Component Value Date   WBC 4.0 11/18/2019   HGB 14.6 11/18/2019   HCT 43.4 11/18/2019   MCV 95 11/18/2019   PLT 262 11/18/2019   Lab Results  Component Value Date   NA 140 11/18/2019   K 4.2 11/18/2019   CO2 25 11/18/2019   GLUCOSE 93 11/18/2019   BUN 18 11/18/2019   CREATININE 0.66 11/18/2019   BILITOT 0.7 11/18/2019   ALKPHOS 76 11/18/2019   AST 21 11/18/2019   ALT 24 11/18/2019   PROT 7.0 11/18/2019   ALBUMIN 4.5 11/18/2019   CALCIUM 10.0 11/18/2019   Lab Results  Component Value Date   CHOL 179 11/18/2019   Lab Results  Component Value Date   HDL 81 11/18/2019   Lab Results  Component Value Date   LDLCALC 86 11/18/2019   Lab Results  Component Value Date   TRIG 63 11/18/2019   Lab Results  Component Value Date   CHOLHDL 2.2 11/18/2019   No results found for: HGBA1C    Assessment & Plan:   #1 history of recurrent depression currently stable on sertraline 50 mg daily.  She had diagnosis of breast cancer back in December but is coping very well.  Staying  actively engaged with hobbies such as horseback riding  -Continue sertraline 50 mg daily  #2 hyperlipidemia treated with simvastatin -Check lipid and hepatic panel -Continue simvastatin 10 mg daily  Consider routine follow-up for physical in 6 months  No orders of the defined types were placed in this encounter.   Follow-up: Return in about 6 months (around 06/17/2021).    Carolann Littler, MD

## 2020-12-19 DIAGNOSIS — D485 Neoplasm of uncertain behavior of skin: Secondary | ICD-10-CM | POA: Diagnosis not present

## 2020-12-19 DIAGNOSIS — L820 Inflamed seborrheic keratosis: Secondary | ICD-10-CM | POA: Diagnosis not present

## 2020-12-25 ENCOUNTER — Other Ambulatory Visit: Payer: Self-pay

## 2020-12-25 ENCOUNTER — Ambulatory Visit: Payer: Medicare Other | Attending: General Surgery

## 2020-12-25 DIAGNOSIS — M25511 Pain in right shoulder: Secondary | ICD-10-CM | POA: Diagnosis not present

## 2020-12-25 DIAGNOSIS — R600 Localized edema: Secondary | ICD-10-CM | POA: Diagnosis not present

## 2020-12-25 DIAGNOSIS — C50411 Malignant neoplasm of upper-outer quadrant of right female breast: Secondary | ICD-10-CM | POA: Diagnosis not present

## 2020-12-25 DIAGNOSIS — Z483 Aftercare following surgery for neoplasm: Secondary | ICD-10-CM | POA: Diagnosis not present

## 2020-12-25 NOTE — Patient Instructions (Signed)
SHOULDER: Flexion - Supine (Cane)        Cancer Rehab [phone]    Hold cane in both hands. Raise arms up overhead. Do not allow back to arch. Hold _5__ seconds. Do __5-10__ times; __1-2__ times a day.  1. Hands shoulder width apart 2.  "V" position   Shoulder Blade Stretch    Clasp fingers behind head with elbows touching in front of face. Pull elbows back while pressing shoulder blades together. Relax and hold as tolerated, can place pillow under elbow here for comfort as needed and to allow for prolonged stretch.  Repeat __5__ times. Do __1-2__ sessions per day.      Copyright  VHI. All rights reserved.   Axillary web syndrome (also called cording) can happen after having breast cancer surgery when lymph nodes in the armpit are removed. It presents as if you have a thin cord in your arm and can run from the armpit all the way down into the forearm. If you've had a sentinel node biopsy, the risk is 1-20% and if you've had an axillary lymph node dissection (more than 7 nodes removed), the risk is 36-72%. The ranges vary depending on the research study.  It most often happens 3-4 weeks post-op but can happen sooner or later. There are several possibilities for what cording actually is. Although no one knows for sure as of yet, it may be related to lymphatics, veins, or other tissue. Sometimes cording resolves on its own but other times it requires physical therapy with a therapist who specializes in lymphedema and/or cancer rehab. Treatment typically involves stretching, manual techniques, and exercise. Sometimes cords get "released" while stretching or during manual treatment and the patient may experience the sensation of a "pop." This may feel strange but it is not dangerous and is a sign that the cord has released; range of motion may be improved in the process.

## 2020-12-25 NOTE — Therapy (Signed)
[place], [place], [address] Phone: [phone]   Fax:  [phone]  Physical Therapy Treatment  Patient Details  Name: Kelly George MRN: [id number] Date of Birth: 04/23/1946 Referring Provider (PT): Dr. Donne Hazel   Encounter Date: 12/25/2020   PT End of Session - 12/25/20 1101    Visit Number 2    Number of Visits 14    Date for PT Re-Evaluation 02/05/21    PT Start Time 0955    PT Stop Time 1050    PT Time Calculation (min) 55 min    Activity Tolerance Patient tolerated treatment well    Behavior During Therapy [facility] for tasks assessed/performed           Past Medical History:  Diagnosis Date  . Anxiety   . Depression   . Heart murmur   . Hyperlipidemia   . RBBB   . Seasonal allergies     Past Surgical History:  Procedure Laterality Date  . BREAST EXCISIONAL BIOPSY Left   . COLONOSCOPY  07/07/01  . MASTECTOMY W/ SENTINEL NODE BIOPSY Right 11/28/2020   Procedure: RIGHT MASTECTOMY WITH RIGHT AXILLARY SENTINEL LYMPH NODE BIOPSY;  Surgeon: Rolm Bookbinder, MD;  Location: [place];  Service: General;  Laterality: Right;  RNFA, PEC BLOCK  . SKIN SURGERY     DERM - nasal   . TOOTH EXTRACTION    . TUBAL LIGATION      There were no vitals filed for this visit.   Subjective Assessment - 12/25/20 0956    Subjective Pt. had right mastectomy with SLNB on 11/28/2020.  She is here for post-surgical reassessment. Feeling really good.  Took Tylenol and advil for a week, but really have very little pain. Feel a little muscle tightness. She has started Anastrazole but does not require chemo or radiation. Drain was removed last Wednesday and she has been working on her shoulder ROM.  Feels a little pain presently in right mid thoracic region that started yesterday and feels like a pulled muscle.  It is getting better. No pain in shoulders but maybe a little tight.    Pertinent  History Pt was diagnosed on December 23,2021 with Right breast CA.  She had  Right mastectomy with SLNB on 11/28/2020 with Dr. Donne Hazel.  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 1.  She had 2 LN removed, both negative. She is here for post surgical screening.  She lives on [address] and rides horses 4 days/week.    Patient Stated Goals Here for post surgical assessment    Currently in Pain? 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 Donne Hazel    Onset Date/Surgical Date 11/28/20    Hand Dominance Right      Precautions   Precautions --   will have lymphedema risk secondary to breast CA, OA,  Precaution Comments lymphedema risk      Restrictions   Weight Bearing Restrictions No      Balance Screen   Has the patient fallen in the past 6 months No    Has the patient had a decrease in activity level because of a fear of falling?  No    Is the patient reluctant to leave their home because of a fear of falling?  No      Prior Function   Leisure rides her horse      Cognition   Overall Cognitive Status Within Functional Limits for tasks assessed      Observation/Other Assessments   Observations Several cords noted right axillary and upper arm region, incision healing;steristrips still present    Skin Integrity several scabs noted at drain site and lateral mastectomy incision.      Observation/Other Assessments-Edema    Edema --   swelling noted right chest area, mild above and below mastectomy incision, and inferior  axillary region     Sensation   Light Touch Appears Intact      AROM   Right Shoulder Extension 65 Degrees    Right Shoulder Flexion 125 Degrees    Right Shoulder ABduction 85 Degrees    Left Shoulder Extension 63 Degrees    Left Shoulder Flexion 150 Degrees    Left Shoulder ABduction 163 Degrees             LYMPHEDEMA/ONCOLOGY QUESTIONNAIRE - 12/25/20 0001      Type   Cancer Type Right breast CA      Surgeries   Mastectomy Date 11/28/20    Axillary Lymph Node Dissection Date 11/28/20    Number Lymph Nodes Removed 2      Treatment   Active Chemotherapy Treatment No    Past Chemotherapy Treatment No    Active Radiation Treatment No    Past Radiation Treatment No    Current Hormone Treatment Yes    Drug Name Anastrazole    Past Hormone Therapy No      What other symptoms do you have   Are you Having Heaviness or Tightness --   slight tightness axillary/chest   Are you having Pain Yes   right thoracic just started yesterday and is improving     Right Upper Extremity Lymphedema   15 cm Proximal to Olecranon Process 31.1 cm    10 cm Proximal to Olecranon Process 30 cm    Olecranon Process 27.3 cm    15 cm Proximal to Ulnar Styloid Process 22.9 cm    10 cm Proximal to Ulnar Styloid Process 19.8 cm    Just Proximal to Ulnar Styloid Process 16.3 cm    Across Hand at [employer] 20.2 cm    At [place] of 2nd Digit 7.3 cm                      [facility] Adult PT Treatment/Exercise - 12/25/20 0001      Shoulder Exercises: Supine   Other Supine Exercises supine wand flex and scaption, stargazer x 5 ea, Clasped hands flex x 5      Shoulder Exercises: Standing   Other Standing Exercises abd wall slides x7      Manual Therapy   Myofascial Release to right upper arm area of cording    Passive ROM PROM right shoulder flex, scaption, abduction, ER  PT Education - 12/25/20 1100    Education Details Reviewed 4 post op exercises with pt and  added supine wand for flexion and scaption.  Pt was also given information on Cording    Person(s) Educated Patient    Methods Explanation;Handout    Comprehension Verbalized understanding;Returned demonstration            PT Short Term Goals - 12/25/20 1116      PT SHORT TERM GOAL #1   Title Right shoulder Abd will improve to 120 degrees for improved reaching abilty    Baseline 156    Time 3    Period Weeks    Status New    Target Date 01/15/21             PT Long Term Goals - 12/25/20 1110      PT LONG TERM GOAL #1   Title Pt will restore shoulder Right ROM to pre-op levels for improved ability to perform home and recreational activities    Baseline 156 abd, 125 flexion pre-surgical    Time 6    Period Weeks    Status New    Target Date 02/05/21      PT LONG TERM GOAL #2   Title Pt will be independent in HEP to improve shoulder ROM/strength    Time 4    Period Weeks    Status New    Target Date 01/22/21      PT LONG TERM GOAL #3   Title Pt will be independent in self MLD to right axillary/chest swelling    Time 3    Period Weeks    Status New    Target Date 01/15/21      PT LONG TERM GOAL #4   Title pt will have a good understanding of skin care and risk reduction practices    Time 4    Period Weeks    Status New    Target Date 01/22/21      PT LONG TERM GOAL #5   Title Pt will be fit for compression sleeve/bra prn    Time 6    Period Weeks    Status New    Target Date 02/05/21                 Plan - 12/25/20 1102    Clinical Impression Statement pt is here for post-surgical assessment.  She presents with decreased Right shoulder A/PROM and cording in the right axilla and upper arm.  There is some post-surgical swelling in right axillary/pectoral region and around mastectomy incision.  There is no swelling noted with circumferential measuring of the UE.  she will benefit from manual techniques to reduce cording, increase ROM, and decrease  swelling. She does not require chemo or radiation.  She had some complaints today of right thoracic pain that occured yesterday and is improving now.    Personal Factors and Comorbidities Comorbidity 2    Comorbidities right breast Mastectomy with SLNB, multi joint OA    Stability/Clinical Decision Making Stable/Uncomplicated    Clinical Decision Making Low    Rehab Potential Excellent    PT Frequency 2x / week    PT Duration 6 weeks    PT Treatment/Interventions ADLs/Self Care Home Management;Therapeutic exercise;Patient/family education;Scar mobilization;Passive range of motion;Manual lymph drainage;Manual techniques    PT Next Visit Plan MFR to cording, AAROM/PROM to restore mobility progressing to strength as tolerated,  MLD to right pec/axillary/incisional area and instruct pt.    PT  Home Exercise Plan 4 post op exs, ABC class, supine wand flex and scaption.    Recommended Other Services compression bra/sleeve?    Consulted and Agree with Plan of Care Patient;Family member/caregiver           Patient will benefit from skilled therapeutic intervention in order to improve the following deficits and impairments:  Decreased knowledge of precautions,Postural dysfunction,Impaired UE functional use,Increased edema,Decreased range of motion,Pain,Decreased skin integrity,Decreased scar mobility  Visit Diagnosis: Malignant neoplasm of upper-outer quadrant of right female breast, unspecified estrogen receptor status [facility])  Aftercare following surgery for neoplasm  Localized edema  Acute pain of right shoulder     Problem List Patient Active Problem List   Diagnosis Date Noted  . Breast cancer, right ([place]) 11/28/2020  . Malignant neoplasm of upper-outer quadrant of right breast in female, estrogen receptor positive ([place]) 11/10/2020  . GERD (gastroesophageal reflux disease) 04/03/2020  . Osteoarthritis of multiple joints 04/03/2020  . Viral warts 07/13/2019  . Aortic atherosclerosis  ([place]) 12/03/2018  . Dyslipidemia 12/03/2018  . Precordial chest pain 07/07/2015  . Pain in joint of right foot 05/16/2014  . Vitamin D deficiency 07/08/2013  . Mitral valve prolapse syndrome, history of mild 01/05/2013  . Seasonal allergies   . Anxiety   . Depression, recurrent ([place])   . Hyperlipidemia     Claris Pong 12/25/2020, 11:44 AM  [place], [place], [address] Phone: [phone]   Fax:  [phone]  Name: Sofiah Lyne MRN: [id number] Date of Birth: [birth year] Cheral Almas, PT 12/25/20 11:46 AM

## 2021-01-02 ENCOUNTER — Ambulatory Visit: Payer: Medicare Other | Admitting: Rehabilitation

## 2021-01-05 ENCOUNTER — Ambulatory Visit: Payer: Medicare Other

## 2021-01-05 ENCOUNTER — Other Ambulatory Visit: Payer: Self-pay

## 2021-01-05 DIAGNOSIS — M25511 Pain in right shoulder: Secondary | ICD-10-CM

## 2021-01-05 DIAGNOSIS — R600 Localized edema: Secondary | ICD-10-CM

## 2021-01-05 DIAGNOSIS — Z483 Aftercare following surgery for neoplasm: Secondary | ICD-10-CM

## 2021-01-05 DIAGNOSIS — C50411 Malignant neoplasm of upper-outer quadrant of right female breast: Secondary | ICD-10-CM

## 2021-01-05 NOTE — Therapy (Signed)
[place], [place], [address] Phone: [phone]   Fax:  [phone]  Physical Therapy Treatment  Patient Details  Name: Kelly George MRN: [id number] Date of Birth: [birth year] Referring Provider (PT): Dr. Donne Hazel   Encounter Date: 01/05/2021   PT End of Session - 01/05/21 0907    Visit Number 3    Number of Visits 14    Date for PT Re-Evaluation 02/05/21    PT Start Time 0902    PT Stop Time 0955    PT Time Calculation (min) 53 min    Activity Tolerance Patient tolerated treatment well    Behavior During Therapy [facility] for tasks assessed/performed           Past Medical History:  Diagnosis Date  . Anxiety   . Depression   . Heart murmur   . Hyperlipidemia   . RBBB   . Seasonal allergies     Past Surgical History:  Procedure Laterality Date  . BREAST EXCISIONAL BIOPSY Left   . COLONOSCOPY  07/07/01  . MASTECTOMY W/ SENTINEL NODE BIOPSY Right 11/28/2020   Procedure: RIGHT MASTECTOMY WITH RIGHT AXILLARY SENTINEL LYMPH NODE BIOPSY;  Surgeon: Rolm Bookbinder, MD;  Location: [place];  Service: General;  Laterality: Right;  RNFA, PEC BLOCK  . SKIN SURGERY     DERM - nasal   . TOOTH EXTRACTION    . TUBAL LIGATION      There were no vitals filed for this visit.   Subjective Assessment - 01/05/21 0902    Subjective Thoracic pain is gone now .   Feel like shoulder ROM is doing better. Feel a little swollen under armpit and maybe a little around incision.    Pertinent History Pt was diagnosed on December 23,2021 with Right breast CA.  She had  Right mastectomy with SLNB on 11/28/2020 with Dr. Donne Hazel.  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  She had 2 LN removed, both negative. She is here for post surgical screening.  She lives on [address] and rides horses 4 days/week.    Currently in Pain? Yes     Pain Score 10-Worst pain ever    Pain Location Axilla    Pain Orientation Right    Pain Descriptors / Indicators Sharp    Pain Type Surgical pain    Pain Frequency Intermittent   intermittent with extreme reaching exercises                            [facility] Adult PT Treatment/Exercise - 01/05/21 0001      Shoulder Exercises: Supine   Other Supine Exercises AROM bilateral flexion, scaption and horizontal abd x 5      Shoulder Exercises: Pulleys   Flexion 2 minutes      Manual Therapy   Myofascial Release to right upper and mid arm area of cording, gently at right chest    Manual Lymphatic Drainage (MLD) short neck, bilateral axillary LN, right groin LN, anterior interaxillary pathway, axilloinguinal pathway, right medial to lateral chest reworking pathways and right lateral trunk in supine and SL reworking pathways    Passive ROM PROM right shoulder flex, scaption, abduction, ER                    PT Short Term Goals - 12/25/20 1116  PT SHORT TERM GOAL #1   Title Right shoulder Abd will improve to 120 degrees for improved reaching abilty    Baseline 156    Time 3    Period Weeks    Status New    Target Date 01/15/21             PT Long Term Goals - 12/25/20 1110      PT LONG TERM GOAL #1   Title Pt will restore shoulder Right ROM to pre-op levels for improved ability to perform home and recreational activities    Baseline 156 abd, 125 flexion pre-surgical    Time 6    Period Weeks    Status New    Target Date 02/05/21      PT LONG TERM GOAL #2   Title Pt will be independent in HEP to improve shoulder ROM/strength    Time 4    Period Weeks    Status New    Target Date 01/22/21      PT LONG TERM GOAL #3   Title Pt will be independent in self MLD to right axillary/chest swelling    Time 3    Period Weeks    Status New    Target Date 01/15/21      PT LONG TERM GOAL #4   Title pt will have a good understanding of skin care  and risk reduction practices    Time 4    Period Weeks    Status New    Target Date 01/22/21      PT LONG TERM GOAL #5   Title Pt will be fit for compression sleeve/bra prn    Time 6    Period Weeks    Status New    Target Date 02/05/21                 Plan - 01/05/21 0951    Clinical Impression Statement Pt continues to have cording right axillary and upper arm restricting ROM .  She loosened up up well with MFR and PROM.  She has a fair amount of swelling at inferior axillary region and right trunk inferior to mastectomy incision.  MLD was initiated to decrease edema in these areas.  Pt will try and wear her binder for a few days to see if it is improved and if not will go to [employer] [place] to get a compression bra.    Personal Factors and Comorbidities Comorbidity 2    Comorbidities right breast Mastectomy with SLNB, multi joint OA    Stability/Clinical Decision Making Stable/Uncomplicated    Rehab Potential Excellent    PT Frequency 2x / week    PT Duration 6 weeks    PT Treatment/Interventions ADLs/Self Care Home Management;Therapeutic exercise;Patient/family education;Scar mobilization;Passive range of motion;Manual lymph drainage;Manual techniques    PT Next Visit Plan MFR to cording, AAROM/PROM to restore mobility progressing to strength as tolerated,  MLD to right pec/axillary/incisional area and instruct pt.    PT Home Exercise Plan 4 post op exs, ABC class, supine wand flex and scaption.NTS    Consulted and Agree with Plan of Care Patient           Patient will benefit from skilled therapeutic intervention in order to improve the following deficits and impairments:  Decreased knowledge of precautions,Postural dysfunction,Impaired UE functional use,Increased edema,Decreased range of motion,Pain,Decreased skin integrity,Decreased scar mobility  Visit Diagnosis: Aftercare following surgery for neoplasm  Localized edema  Acute pain of right shoulder  Malignant  neoplasm of upper-outer quadrant of right female breast, unspecified estrogen receptor status ([place])     Problem List Patient Active Problem List   Diagnosis Date Noted  . Breast cancer, right ([place]) 11/28/2020  . Malignant neoplasm of upper-outer quadrant of right breast in female, estrogen receptor positive ([place]) 11/10/2020  . GERD (gastroesophageal reflux disease) 04/03/2020  . Osteoarthritis of multiple joints 04/03/2020  . Viral warts 07/13/2019  . Aortic atherosclerosis ([place]) 12/03/2018  . Dyslipidemia 12/03/2018  . Precordial chest pain 07/07/2015  . Pain in joint of right foot 05/16/2014  . Vitamin D deficiency 07/08/2013  . Mitral valve prolapse syndrome, history of mild 01/05/2013  . Seasonal allergies   . Anxiety   . Depression, recurrent ([place])   . Hyperlipidemia     Claris Pong 01/05/2021, 9:59 AM  [place], [place], [address] Phone: [phone]   Fax:  [phone]  Name: Kelly George MRN: [id number] Date of Birth: May 30, 1946 Cheral Almas, PT 01/05/21 10:01 AM

## 2021-01-09 ENCOUNTER — Other Ambulatory Visit: Payer: Self-pay

## 2021-01-09 ENCOUNTER — Ambulatory Visit: Payer: Medicare Other

## 2021-01-09 DIAGNOSIS — R600 Localized edema: Secondary | ICD-10-CM | POA: Diagnosis not present

## 2021-01-09 DIAGNOSIS — M25511 Pain in right shoulder: Secondary | ICD-10-CM | POA: Diagnosis not present

## 2021-01-09 DIAGNOSIS — Z483 Aftercare following surgery for neoplasm: Secondary | ICD-10-CM | POA: Diagnosis not present

## 2021-01-09 DIAGNOSIS — C50411 Malignant neoplasm of upper-outer quadrant of right female breast: Secondary | ICD-10-CM | POA: Diagnosis not present

## 2021-01-09 NOTE — Therapy (Signed)
[place], [place], [address] Phone: [phone]   Fax:  [phone]  Physical Therapy Treatment  Patient Details  Name: Kelly George MRN: [id number] Date of Birth: 07-18-1946 Referring Provider (PT): Dr. Donne Hazel   Encounter Date: 01/09/2021   PT End of Session - 01/09/21 1101    Visit Number 4    Number of Visits 14    Date for PT Re-Evaluation 02/05/21    PT Start Time 1005    PT Stop Time 1055    PT Time Calculation (min) 50 min    Activity Tolerance Patient tolerated treatment well    Behavior During Therapy [facility] for tasks assessed/performed           Past Medical History:  Diagnosis Date  . Anxiety   . Depression   . Heart murmur   . Hyperlipidemia   . RBBB   . Seasonal allergies     Past Surgical History:  Procedure Laterality Date  . BREAST EXCISIONAL BIOPSY Left   . COLONOSCOPY  07/07/01  . MASTECTOMY W/ SENTINEL NODE BIOPSY Right 11/28/2020   Procedure: RIGHT MASTECTOMY WITH RIGHT AXILLARY SENTINEL LYMPH NODE BIOPSY;  Surgeon: Rolm Bookbinder, MD;  Location: [place];  Service: General;  Laterality: Right;  RNFA, PEC BLOCK  . SKIN SURGERY     DERM - nasal   . TOOTH EXTRACTION    . TUBAL LIGATION      There were no vitals filed for this visit.   Subjective Assessment - 01/09/21 1005    Subjective If I put my hand on my hips  I can feel pulling at cording.  Since Friday I feel like I am more stiff.  I think the binder has helped the swelling some.    Pertinent History Pt was diagnosed on December 23,2021 with Right breast CA.  She had  Right mastectomy with SLNB on 11/28/2020 with Dr. Donne Hazel.  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  She had 2 LN removed, both negative. She is here for post surgical screening.  She lives on [address] and rides horses 4 days/week.    Patient  Stated Goals Here for post surgical assessment    Pain Score 0-No pain                             [facility] Adult PT Treatment/Exercise - 01/09/21 0001      Manual Therapy   Manual Therapy Edema management    Edema Management large chip pack made for pt to wear just under right mastectomy incision to decrease swelling    Myofascial Release to right upper and mid arm area of cording, gently at right chest    Manual Lymphatic Drainage (MLD) short neck, bilateral axillary LN, right groin LN, anterior interaxillary pathway, axilloinguinal pathway, right medial to lateral chest reworking pathways and right lateral trunk in supine and SL reworking pathways    Passive ROM PROM right shoulder flex, scaption, abduction, ER                    PT Short Term Goals - 12/25/20 1116      PT SHORT TERM GOAL #1   Title Right shoulder Abd will improve to 120 degrees for improved reaching abilty    Baseline 156    Time 3  Period Weeks    Status New    Target Date 01/15/21             PT Long Term Goals - 12/25/20 1110      PT LONG TERM GOAL #1   Title Pt will restore shoulder Right ROM to pre-op levels for improved ability to perform home and recreational activities    Baseline 156 abd, 125 flexion pre-surgical    Time 6    Period Weeks    Status New    Target Date 02/05/21      PT LONG TERM GOAL #2   Title Pt will be independent in HEP to improve shoulder ROM/strength    Time 4    Period Weeks    Status New    Target Date 01/22/21      PT LONG TERM GOAL #3   Title Pt will be independent in self MLD to right axillary/chest swelling    Time 3    Period Weeks    Status New    Target Date 01/15/21      PT LONG TERM GOAL #4   Title pt will have a good understanding of skin care and risk reduction practices    Time 4    Period Weeks    Status New    Target Date 01/22/21      PT LONG TERM GOAL #5   Title Pt will be fit for compression sleeve/bra prn     Time 6    Period Weeks    Status New    Target Date 02/05/21                 Plan - 01/09/21 1102    Clinical Impression Statement Pt has been wearing her binder but there was minmal change in swelling under incision.  chip pack was made to cover full length of swelling and she is wearing in her binder.  Pt reported her shoulder was very stiff on Friday and Saturday but she loosened up nicely with MFR techniques and PROM.  Cording is less prominent    Personal Factors and Comorbidities Comorbidity 2    Comorbidities right breast Mastectomy with SLNB, multi joint OA    Stability/Clinical Decision Making Stable/Uncomplicated    Rehab Potential Excellent    PT Frequency 2x / week    PT Duration 6 weeks    PT Treatment/Interventions ADLs/Self Care Home Management;Therapeutic exercise;Patient/family education;Scar mobilization;Passive range of motion;Manual lymph drainage;Manual techniques    PT Next Visit Plan assess benefit of chip pack,MFR to cording, AAROM/PROM to restore mobility progressing to strength as tolerated,  MLD to right pec/axillary/incisional area and instruct pt.    PT Home Exercise Plan 4 post op exs, ABC class, supine wand flex and scaption.NTS    Consulted and Agree with Plan of Care Patient           Patient will benefit from skilled therapeutic intervention in order to improve the following deficits and impairments:  Decreased knowledge of precautions,Postural dysfunction,Impaired UE functional use,Increased edema,Decreased range of motion,Pain,Decreased skin integrity,Decreased scar mobility  Visit Diagnosis: Aftercare following surgery for neoplasm  Localized edema  Acute pain of right shoulder  Malignant neoplasm of upper-outer quadrant of right female breast, unspecified estrogen receptor status ([place])     Problem List Patient Active Problem List   Diagnosis Date Noted  . Breast cancer, right ([place]) 11/28/2020  . Malignant neoplasm of  upper-outer quadrant of right breast in female, estrogen receptor positive ([place])  11/10/2020  . GERD (gastroesophageal reflux disease) 04/03/2020  . Osteoarthritis of multiple joints 04/03/2020  . Viral warts 07/13/2019  . Aortic atherosclerosis ([place]) 12/03/2018  . Dyslipidemia 12/03/2018  . Precordial chest pain 07/07/2015  . Pain in joint of right foot 05/16/2014  . Vitamin D deficiency 07/08/2013  . Mitral valve prolapse syndrome, history of mild 01/05/2013  . Seasonal allergies   . Anxiety   . Depression, recurrent ([place])   . Hyperlipidemia     Claris Pong 01/09/2021, 11:56 AM  [place], [place], [address] Phone: [phone]   Fax:  [phone]  Name: Eugenie [place] MRN: [id number] Date of Birth: Jul 06, 1946  Cheral Almas, PT 01/09/21 11:58 AM

## 2021-01-11 ENCOUNTER — Other Ambulatory Visit: Payer: Self-pay

## 2021-01-11 ENCOUNTER — Ambulatory Visit: Payer: Medicare Other

## 2021-01-11 DIAGNOSIS — Z483 Aftercare following surgery for neoplasm: Secondary | ICD-10-CM | POA: Diagnosis not present

## 2021-01-11 DIAGNOSIS — M25511 Pain in right shoulder: Secondary | ICD-10-CM | POA: Diagnosis not present

## 2021-01-11 DIAGNOSIS — C50411 Malignant neoplasm of upper-outer quadrant of right female breast: Secondary | ICD-10-CM

## 2021-01-11 DIAGNOSIS — R600 Localized edema: Secondary | ICD-10-CM

## 2021-01-11 NOTE — Therapy (Signed)
[place], [place], [address] Phone: [phone]   Fax:  [phone]  Physical Therapy Treatment  Patient Details  Name: Kelly George MRN: [id number] Date of Birth: Oct 02, 1946 Referring Provider (PT): Dr. Donne Hazel   Encounter Date: 01/11/2021   PT End of Session - 01/11/21 1034    Visit Number 5    Number of Visits 14    Date for PT Re-Evaluation 02/05/21    PT Start Time 1003    PT Stop Time 1055    PT Time Calculation (min) 52 min    Activity Tolerance Patient tolerated treatment well    Behavior During Therapy [facility] for tasks assessed/performed           Past Medical History:  Diagnosis Date  . Anxiety   . Depression   . Heart murmur   . Hyperlipidemia   . RBBB   . Seasonal allergies     Past Surgical History:  Procedure Laterality Date  . BREAST EXCISIONAL BIOPSY Left   . COLONOSCOPY  07/07/01  . MASTECTOMY W/ SENTINEL NODE BIOPSY Right 11/28/2020   Procedure: RIGHT MASTECTOMY WITH RIGHT AXILLARY SENTINEL LYMPH NODE BIOPSY;  Surgeon: Rolm Bookbinder, MD;  Location: [facility];  Service: General;  Laterality: Right;  RNFA, PEC BLOCK  . SKIN SURGERY     DERM - nasal   . TOOTH EXTRACTION    . TUBAL LIGATION      There were no vitals filed for this visit.   Subjective Assessment - 01/11/21 1000    Subjective Did fine after last visit.  Tightness is still there off and on especially at axillary border.  The chip pack seems to help with the swelling.    Pertinent History Pt was diagnosed on December 23,2021 with Right breast CA.  She had  Right mastectomy with SLNB on 11/28/2020 with Dr. Donne [facility].  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  She had 2 LN removed, both negative. She is here for post surgical screening.  She lives on [address] and rides horses 4 days/week.    Patient Stated Goals  Here for post surgical assessment    Currently in Pain? No/denies    Pain Score 0-No pain                             [facility] Adult PT Treatment/Exercise - 01/11/21 0001      Shoulder Exercises: Supine   Other Supine Exercises AROM bilateral flexion, scaption and horizontal abd x 5      Shoulder Exercises: Pulleys   Flexion 2 minutes    ABduction 2 minutes      Shoulder Exercises: Therapy Ball   Flexion 10 reps    ABduction 5 reps      Manual Therapy   Edema Management Pt to continue with chip pack    Myofascial Release to right upper and mid arm area of cording, gently at right chest    Manual Lymphatic Drainage (MLD) --    Passive ROM PROM right shoulder flex, scaption, abduction, ER                    PT Short Term Goals - 12/25/20 1116      PT SHORT TERM GOAL #1   Title Right shoulder Abd will improve to 120 degrees for improved  reaching abilty    Baseline 156    Time 3    Period Weeks    Status New    Target Date 01/15/21             PT Long Term Goals - 12/25/20 1110      PT LONG TERM GOAL #1   Title Pt will restore shoulder Right ROM to pre-op levels for improved ability to perform home and recreational activities    Baseline 156 abd, 125 flexion pre-surgical    Time 6    Period Weeks    Status New    Target Date 02/05/21      PT LONG TERM GOAL #2   Title Pt will be independent in HEP to improve shoulder ROM/strength    Time 4    Period Weeks    Status New    Target Date 01/22/21      PT LONG TERM GOAL #3   Title Pt will be independent in self MLD to right axillary/chest swelling    Time 3    Period Weeks    Status New    Target Date 01/15/21      PT LONG TERM GOAL #4   Title pt will have a good understanding of skin care and risk reduction practices    Time 4    Period Weeks    Status New    Target Date 01/22/21      PT LONG TERM GOAL #5   Title Pt will be fit for compression sleeve/bra prn    Time 6     Period Weeks    Status New    Target Date 02/05/21                 Plan - 01/11/21 1035    Clinical Impression Statement Pt continues with multiple cords in the mid and upper axillary region throughout the upper arm which limit abduction the most.  Swelling  is still noted inferior to the mastectomy incision but is improved with chip pack. Shoulder stiffness has been some better and she is compliant with HEP.  Pt is getting measured for sleeve tomorrow. Discussed possible benefit for pt. To purchase pulleys online.    Personal Factors and Comorbidities Comorbidity 2    Comorbidities right breast Mastectomy with SLNB, multi joint OA    Stability/Clinical Decision Making Stable/Uncomplicated    Rehab Potential Excellent    PT Frequency 2x / week    PT Duration 6 weeks    PT Treatment/Interventions ADLs/Self Care Home Management;Therapeutic exercise;Patient/family education;Scar mobilization;Passive range of motion;Manual lymph drainage;Manual techniques    PT Next Visit Plan assess benefit of chip pack,MFR to cording, AAROM/PROM to restore mobility progressing to strength as tolerated,  MLD to right pec/axillary/incisional area and instruct pt.    PT Home Exercise Plan 4 post op exs, ABC class, supine wand flex and scaption.NTS    Consulted and Agree with Plan of Care Patient           Patient will benefit from skilled therapeutic intervention in order to improve the following deficits and impairments:  Decreased knowledge of precautions,Postural dysfunction,Impaired UE functional use,Increased edema,Decreased range of motion,Pain,Decreased skin integrity,Decreased scar mobility  Visit Diagnosis: Aftercare following surgery for neoplasm  Localized edema  Acute pain of right shoulder  Malignant neoplasm of upper-outer quadrant of right female breast, unspecified estrogen receptor status ([place])     Problem List Patient Active Problem List   Diagnosis Date Noted  .  Breast  cancer, right ([place]) 11/28/2020  . Malignant neoplasm of upper-outer quadrant of right breast in female, estrogen receptor positive ([place]) 11/10/2020  . GERD (gastroesophageal reflux disease) 04/03/2020  . Osteoarthritis of multiple joints 04/03/2020  . Viral warts 07/13/2019  . Aortic atherosclerosis ([place]) 12/03/2018  . Dyslipidemia 12/03/2018  . Precordial chest pain 07/07/2015  . Pain in joint of right foot 05/16/2014  . Vitamin D deficiency 07/08/2013  . Mitral valve prolapse syndrome, history of mild 01/05/2013  . Seasonal allergies   . Anxiety   . Depression, recurrent ([place])   . Hyperlipidemia     Claris Pong 01/11/2021, 10:59 AM  [place], [place], [address] Phone: [phone]   Fax:  [phone]  Name: Kelly George MRN: [id number] Date of Birth: [birth year]  Cheral Almas, PT 01/11/21 11:00 AM

## 2021-01-16 ENCOUNTER — Ambulatory Visit: Payer: Medicare Other | Attending: General Surgery

## 2021-01-16 ENCOUNTER — Other Ambulatory Visit: Payer: Self-pay

## 2021-01-16 DIAGNOSIS — R600 Localized edema: Secondary | ICD-10-CM | POA: Diagnosis not present

## 2021-01-16 DIAGNOSIS — Z483 Aftercare following surgery for neoplasm: Secondary | ICD-10-CM | POA: Diagnosis not present

## 2021-01-16 DIAGNOSIS — C50411 Malignant neoplasm of upper-outer quadrant of right female breast: Secondary | ICD-10-CM | POA: Insufficient documentation

## 2021-01-16 DIAGNOSIS — M25511 Pain in right shoulder: Secondary | ICD-10-CM | POA: Diagnosis not present

## 2021-01-16 NOTE — Therapy (Signed)
[place], [place], [address] Phone: [phone]   Fax:  [phone]  Physical Therapy Treatment  Patient Details  Name: Kelly George MRN: [id number] Date of Birth: March 23, 1946 Referring Provider (PT): Dr. Donne [facility]   Encounter Date: 01/16/2021   PT End of Session - 01/16/21 1217    Visit Number 6    Number of Visits 14    Date for PT Re-Evaluation 02/05/21    PT Start Time 1003    PT Stop Time 1102    PT Time Calculation (min) 59 min           Past Medical History:  Diagnosis Date  . Anxiety   . Depression   . Heart murmur   . Hyperlipidemia   . RBBB   . Seasonal allergies     Past Surgical History:  Procedure Laterality Date  . BREAST EXCISIONAL BIOPSY Left   . COLONOSCOPY  07/07/01  . MASTECTOMY W/ SENTINEL NODE BIOPSY Right 11/28/2020   Procedure: RIGHT MASTECTOMY WITH RIGHT AXILLARY SENTINEL LYMPH NODE BIOPSY;  Surgeon: Rolm Bookbinder, MD;  Location: [facility];  Service: General;  Laterality: Right;  RNFA, PEC BLOCK  . SKIN SURGERY     DERM - nasal   . TOOTH EXTRACTION    . TUBAL LIGATION      There were no vitals filed for this visit.   Subjective Assessment - 01/16/21 1002    Subjective Did fine after last visit.  Cords are not bothering me.  ROM seems to be doing well. The chip pack seems to help with the swelling.  Saw Dr. Donne Hazel and he thought I was doing well.  Still puffy under the right arm and he said to massage it.    Pertinent History Pt was diagnosed on December 23,2021 with Right breast CA.  She had  Right mastectomy with SLNB on 11/28/2020 with Dr. Donne [facility].  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  She had 2 LN removed, both negative. She is here for post surgical screening.  She lives on [address] and rides horses 4 days/week.    Patient Stated Goals Here for post  surgical assessment    Currently in Pain? No/denies                 LYMPHEDEMA/ONCOLOGY QUESTIONNAIRE - 01/16/21 0001      Right Upper Extremity Lymphedema   15 cm Proximal to Olecranon Process 31.7 cm    10 cm Proximal to Olecranon Process 30.7 cm    Olecranon Process 27.1 cm    15 cm Proximal to Ulnar Styloid Process 23.5 cm    10 cm Proximal to Ulnar Styloid Process 20.2 cm    Just Proximal to Ulnar Styloid Process 16.4 cm    Across Hand at [employer] 20.3 cm    At [place] of 2nd Digit 7 cm      Left Upper Extremity Lymphedema   15 cm Proximal to Olecranon Process 31.9 cm    10 cm Proximal to Olecranon Process 29.4 cm    Olecranon Process 27.7 cm                      [facility] Adult PT Treatment/Exercise - 01/16/21 0001      Manual Therapy   Edema Management PPt. was measured for compression bra and figures sent to [facility]. she  is to continue with chip pack until bra is received.    Myofascial Release to right upper and mid arm area of cording, gently at right chest    Manual Lymphatic Drainage (MLD) Therapist performed and pt was instructed in short neck, bilateral axillary LN, right groin LN, anterior interaxillary pathway, axilloinguinal pathway, right medial to lateral chest reworking pathways and right lateral trunk in supine and  reworking pathways    Passive ROM PROM right shoulder flex, scaption, abduction, ER                  PT Education - 01/16/21 1216    Education Details Pt was educated in self MLD to right chest swelling and inferior axillary swelling and practiced all.  She was given written instructions.  She did very well overall but needs review    Person(s) Educated Patient    Methods Explanation;Demonstration;Handout;Tactile cues;Verbal cues    Comprehension Verbalized understanding;Returned demonstration;Need further instruction;Tactile cues required;Verbal cues required            PT Short Term Goals - 12/25/20 1116       PT SHORT TERM GOAL #1   Title Right shoulder Abd will improve to 120 degrees for improved reaching abilty    Baseline 156    Time 3    Period Weeks    Status New    Target Date 01/15/21             PT Long Term Goals - 12/25/20 1110      PT LONG TERM GOAL #1   Title Pt will restore shoulder Right ROM to pre-op levels for improved ability to perform home and recreational activities    Baseline 156 abd, 125 flexion pre-surgical    Time 6    Period Weeks    Status New    Target Date 02/05/21      PT LONG TERM GOAL #2   Title Pt will be independent in HEP to improve shoulder ROM/strength    Time 4    Period Weeks    Status New    Target Date 01/22/21      PT LONG TERM GOAL #3   Title Pt will be independent in self MLD to right axillary/chest swelling    Time 3    Period Weeks    Status New    Target Date 01/15/21      PT LONG TERM GOAL #4   Title pt will have a good understanding of skin care and risk reduction practices    Time 4    Period Weeks    Status New    Target Date 01/22/21      PT LONG TERM GOAL #5   Title Pt will be fit for compression sleeve/bra prn    Time 6    Period Weeks    Status New    Target Date 02/05/21                 Plan - 01/16/21 1219    Clinical Impression Statement Pt was measured for a compression bra today and figures sent to [facility].( 1)39, 2)41  3)41.4  Manual therapy including MFR to cording and right chest area was performed in addition to PROM.  Pt was educated in self MLD to the swollen areas inferior to axilla and under her mastectomy incision.  She did very well overall and improved greatly with practice. She does intend to get a prosthesis . Pt expressed concern about possible  right upper arm swelling but measurements revealed very little difference in circumference.  Will monitor pre   Personal Factors and Comorbidities Comorbidity 2    Comorbidities right breast Mastectomy with SLNB, multi joint OA     Stability/Clinical Decision Making Stable/Uncomplicated    Rehab Potential Excellent    PT Frequency 2x / week    PT Duration 6 weeks    PT Treatment/Interventions ADLs/Self Care Home Management;Therapeutic exercise;Patient/family education;Scar mobilization;Passive range of motion;Manual lymph drainage;Manual techniques    PT Next Visit Plan check goals, continue review of MLD, MFR/scar mobs, supine and standing TB    PT Home Exercise Plan 4 post op exs, ABC class, supine wand flex and scaption.NTS    Consulted and Agree with Plan of Care Patient           Patient will benefit from skilled therapeutic intervention in order to improve the following deficits and impairments:  Decreased knowledge of precautions,Postural dysfunction,Impaired UE functional use,Increased edema,Decreased range of motion,Pain,Decreased skin integrity,Decreased scar mobility  Visit Diagnosis: Aftercare following surgery for neoplasm  Localized edema  Acute pain of right shoulder  Malignant neoplasm of upper-outer quadrant of right female breast, unspecified estrogen receptor status ([place])     Problem List Patient Active Problem List   Diagnosis Date Noted  . Breast cancer, right ([place]) 11/28/2020  . Malignant neoplasm of upper-outer quadrant of right breast in female, estrogen receptor positive ([place]) 11/10/2020  . GERD (gastroesophageal reflux disease) 04/03/2020  . Osteoarthritis of multiple joints 04/03/2020  . Viral warts 07/13/2019  . Aortic atherosclerosis ([place]) 12/03/2018  . Dyslipidemia 12/03/2018  . Precordial chest pain 07/07/2015  . Pain in joint of right foot 05/16/2014  . Vitamin D deficiency 07/08/2013  . Mitral valve prolapse syndrome, history of mild 01/05/2013  . Seasonal allergies   . Anxiety   . Depression, recurrent ([place])   . Hyperlipidemia     Claris Pong 01/16/2021, 12:25 PM  [place], [place], [address] Phone: [phone]   Fax:  [phone]  Name: Kelly George MRN: [id number] Date of Birth: 08/24/1946  Cheral Almas, PT 01/16/21 12:28 PM

## 2021-01-16 NOTE — Patient Instructions (Signed)
Manual Lymph Drainage for Right Breast.  Do daily.  Do slowly. Use flat hands with just enough pressure to stretch the skin. Do not slide over the skin, but move the skin with the hand you're using. Lie down or sit comfortably (in a recliner, for example) to do this.  1) Hug yourself:  cross arms and do circles at collar bones near neck 5-7 times (to "wake up" lots of lymph nodes in this area). 2) Take slow deep breaths, allowing your belly to balloon out as your breathe in, 5x (to "wake up" abdominal lymph nodes to take on extra fluid). 3) Left armpit--stretch skin in small circles to stimulate intact lymph nodes there, 5-7x. 4) Right groin area, at panty line--stretch skin in small circles to stimulate lymph nodes 5-7x. 5) Redirect fluid from right chest toward left armpit (stretch skin starting at right chest in 3-4 spots working toward left armpit) 3-4x across the chest. 6) Redirect fluid from right armpit toward right groin (cup your hand around the curve of your right side and do 3-4 "pumps" from armpit to groin) 3-4x down your side. 7)  End with repeating #3 and #4 above.   [facility] [address] [place]   [address] [phone]

## 2021-01-18 ENCOUNTER — Ambulatory Visit: Payer: Medicare Other

## 2021-01-18 ENCOUNTER — Other Ambulatory Visit: Payer: Self-pay

## 2021-01-18 DIAGNOSIS — R600 Localized edema: Secondary | ICD-10-CM

## 2021-01-18 DIAGNOSIS — C50411 Malignant neoplasm of upper-outer quadrant of right female breast: Secondary | ICD-10-CM | POA: Diagnosis not present

## 2021-01-18 DIAGNOSIS — Z483 Aftercare following surgery for neoplasm: Secondary | ICD-10-CM

## 2021-01-18 DIAGNOSIS — M25511 Pain in right shoulder: Secondary | ICD-10-CM

## 2021-01-18 NOTE — Patient Instructions (Signed)

## 2021-01-18 NOTE — Therapy (Signed)
[place], [place], [address] Phone: [phone]   Fax:  [phone]  Physical Therapy Treatment  Patient Details  Name: Kelly George MRN: [id number] Date of Birth: May 07, 1946 Referring Provider (PT): Dr. Donne George   Encounter Date: 01/18/2021   PT End of Session - 01/18/21 1012    Visit Number 7    Number of Visits 14    Date for PT Re-Evaluation 02/05/21    PT Start Time 1003    PT Stop Time 1050    PT Time Calculation (min) 47 min    Activity Tolerance Patient tolerated treatment well    Behavior During Therapy [facility] for tasks assessed/performed           Past Medical History:  Diagnosis Date  . Anxiety   . Depression   . Heart murmur   . Hyperlipidemia   . RBBB   . Seasonal allergies     Past Surgical History:  Procedure Laterality Date  . BREAST EXCISIONAL BIOPSY Left   . COLONOSCOPY  07/07/01  . MASTECTOMY W/ SENTINEL NODE BIOPSY Right 11/28/2020   Procedure: RIGHT MASTECTOMY WITH RIGHT AXILLARY SENTINEL LYMPH NODE BIOPSY;  Surgeon: Kelly Bookbinder, MD;  Location: [facility];  Service: General;  Laterality: Right;  RNFA, PEC BLOCK  . SKIN SURGERY     DERM - nasal   . TOOTH EXTRACTION    . TUBAL LIGATION      There were no vitals filed for this visit.   Subjective Assessment - 01/18/21 1000    Subjective Everything is doing well.  Not noticing the cords much.  going to [facility] [place] today to get prosthesis and bra after visit today.    Pertinent History Pt was diagnosed on December 23,2021 with Right breast CA.  She had  Right mastectomy with SLNB on 11/28/2020 with Dr. Donne George.  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  She had 2 LN removed, both negative. She is here for post surgical screening.  She lives on [address] and rides horses 4 days/week.    Currently in Pain? No/denies     Pain Score 0-No pain              [facility] PT Assessment - 01/18/21 0001      AROM   Right Shoulder Extension 70 Degrees    Right Shoulder Flexion 150 Degrees    Right Shoulder ABduction 161 Degrees                         [facility] Adult PT Treatment/Exercise - 01/18/21 0001      Shoulder Exercises: Supine   Horizontal ABduction Strengthening;Both;10 reps    Theraband Level (Shoulder Horizontal ABduction) Level 1 (Yellow)    External Rotation Strengthening;Both;10 reps    Theraband Level (Shoulder External Rotation) Level 1 (Yellow)    Flexion Strengthening;Both;10 reps    Theraband Level (Shoulder Flexion) Level 1 (Yellow)    Diagonals Strengthening;Right;Left;5 reps      Shoulder Exercises: Pulleys   Flexion 2 minutes    ABduction 2 minutes      Manual Therapy   Soft tissue mobilization scar mobilization to mastectomy scar    Myofascial Release to right chest area    Manual Lymphatic Drainage (MLD) Therapist demonstratedand pt was instructed in short neck, bilateral axillary LN, right groin LN, anterior interaxillary pathway,  axilloinguinal pathway, right medial to lateral chest reworking pathways and right lateral trunk in supine and  reworking pathways                    PT Short Term Goals - 12/25/20 1116      PT SHORT TERM GOAL #1   Title Right shoulder Abd will improve to 120 degrees for improved reaching abilty    Baseline 156    Time 3    Period Weeks    Status New    Target Date 01/15/21             PT Long Term Goals - 01/18/21 1004      PT LONG TERM GOAL #1   Title Pt will restore shoulder Right ROM to pre-op levels for improved ability to perform home and recreational activities    Time 6    Period Weeks    Status Achieved      PT LONG TERM GOAL #2   Title Pt will be independent in HEP to improve shoulder ROM/strength    Time 4    Period Weeks    Status Achieved      PT LONG TERM GOAL #3   Title Pt will be  independent in self MLD to right axillary/chest swelling    Time 3    Period Weeks    Status Achieved     PT LONG TERM GOAL #4   Title pt will have a good understanding of skin care and risk reduction practices    Time 4    Period Weeks    Status Achieved      PT LONG TERM GOAL #5   Title Pt will be fit for compression sleeve/bra prn    Baseline fit but not received    Time 6    Period Weeks    Status New                 Plan - 01/18/21 1012    Clinical Impression Statement Pts. goals were assessed today and she is making excellent progress. She has achieved Pre-surgical shoulder ROM, and is now independent with Self MLD and her HEP.  She still has mild cording but it does not interfere with her ROM.  She continues with some chest swelling and is using a chip pack, but is also awaiting a compression bra and her sleeve.  She demonstrated good form with theraband exercises today.  She was advised to continue MLD and to perform scar mobilization daily.  She is interested in going to a personal trainer so I told her we will start here with ABC strength.    Personal Factors and Comorbidities Comorbidity 2    Comorbidities right breast Mastectomy with SLNB, multi joint OA    Stability/Clinical Decision Making Stable/Uncomplicated    Rehab Potential Excellent    PT Frequency 2x / week    PT Treatment/Interventions ADLs/Self Care Home Management;Therapeutic exercise;Patient/family education;Scar mobilization;Passive range of motion;Manual lymph drainage;Manual techniques    PT Next Visit Plan ABC strength class,MFR/scar mobs, standing TB, check sleeve and bra when it arrives    PT Home Exercise Plan 4 post op exs, ABC class, supine wand flex and scaption.NTS, supine scapulat theraband exs    Consulted and Agree with Plan of Care Patient           Patient will benefit from skilled therapeutic intervention in order to improve the following deficits and impairments:  Decreased  knowledge of  precautions,Postural dysfunction,Impaired UE functional use,Increased edema,Decreased range of motion,Pain,Decreased skin integrity,Decreased scar mobility  Visit Diagnosis: Aftercare following surgery for neoplasm  Localized edema  Acute pain of right shoulder  Malignant neoplasm of upper-outer quadrant of right female breast, unspecified estrogen receptor status ([place])     Problem List Patient Active Problem List   Diagnosis Date Noted  . Breast cancer, right ([place]) 11/28/2020  . Malignant neoplasm of upper-outer quadrant of right breast in female, estrogen receptor positive ([place]) 11/10/2020  . GERD (gastroesophageal reflux disease) 04/03/2020  . Osteoarthritis of multiple joints 04/03/2020  . Viral warts 07/13/2019  . Aortic atherosclerosis ([place]) 12/03/2018  . Dyslipidemia 12/03/2018  . Precordial chest pain 07/07/2015  . Pain in joint of right foot 05/16/2014  . Vitamin D deficiency 07/08/2013  . Mitral valve prolapse syndrome, history of mild 01/05/2013  . Seasonal allergies   . Anxiety   . Depression, recurrent ([place])   . Hyperlipidemia     Claris Pong 01/18/2021, 10:58 AM  [place], [place], [address] Phone: [phone]   Fax:  [phone]  Name: Kelly George MRN: [id number] Date of Birth: 21-Oct-1945 Cheral Almas, PT 01/18/21 11:00 AM

## 2021-01-25 ENCOUNTER — Ambulatory Visit: Payer: Medicare Other

## 2021-01-25 ENCOUNTER — Other Ambulatory Visit: Payer: Self-pay

## 2021-01-25 DIAGNOSIS — R600 Localized edema: Secondary | ICD-10-CM | POA: Diagnosis not present

## 2021-01-25 DIAGNOSIS — Z483 Aftercare following surgery for neoplasm: Secondary | ICD-10-CM | POA: Diagnosis not present

## 2021-01-25 DIAGNOSIS — M25511 Pain in right shoulder: Secondary | ICD-10-CM

## 2021-01-25 DIAGNOSIS — C50411 Malignant neoplasm of upper-outer quadrant of right female breast: Secondary | ICD-10-CM

## 2021-01-25 NOTE — Therapy (Signed)
[place], [place], [address] Phone: [phone]   Fax:  [phone]  Physical Therapy Treatment  Patient Details  Name: Kelly George MRN: [id number] Date of Birth: May 23, 1946 Referring Provider (PT): Dr. Donne Hazel   Encounter Date: 01/25/2021   PT End of Session - 01/25/21 1056    Visit Number 8    Number of Visits 14    Date for PT Re-Evaluation 02/05/21    PT Start Time 1324    PT Stop Time 1055    PT Time Calculation (min) 53 min    Activity Tolerance Patient tolerated treatment well    Behavior During Therapy [facility] for tasks assessed/performed           Past Medical History:  Diagnosis Date  . Anxiety   . Depression   . Heart murmur   . Hyperlipidemia   . RBBB   . Seasonal allergies     Past Surgical History:  Procedure Laterality Date  . BREAST EXCISIONAL BIOPSY Left   . COLONOSCOPY  07/07/01  . MASTECTOMY W/ SENTINEL NODE BIOPSY Right 11/28/2020   Procedure: RIGHT MASTECTOMY WITH RIGHT AXILLARY SENTINEL LYMPH NODE BIOPSY;  Surgeon: Rolm Bookbinder, MD;  Location: [facility];  Service: General;  Laterality: Right;  RNFA, PEC BLOCK  . SKIN SURGERY     DERM - nasal   . TOOTH EXTRACTION    . TUBAL LIGATION      There were no vitals filed for this visit.   Subjective Assessment - 01/25/21 1006    Subjective I got the Belisse Bra yesterday in the mail but I havent tried on yet. Shoulder and chest area seems to be doing OK.  Can still feel the cords when I stretch.  Had a return of the poserior mid back pain after I stopped riding and some radiated to the front of my ribs.  Its not there now, But it has happened several times after riding. I have been doing the MLD at home. Not sure if it is helping alot.    Pertinent History Pt was diagnosed on December 23,2021 with Right breast CA.  She had  Right mastectomy with SLNB on 11/28/2020 with Dr. Donne Hazel.  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 1.  She had 2 LN removed, both negative. She is here for post surgical screening.  She lives on [address] and rides horses 4 days/week.    Currently in Pain? No/denies    Pain Location Rib cage    Pain Orientation Posterior;Right                             [facility] Adult PT Treatment/Exercise - 01/25/21 0001      Manual Therapy   Edema Management pt tried on Belisse bra and bra was adjusMFR  ted for pt.    Soft tissue mobilization scar mobilization to mastectomy scar    Myofascial Release MFR to right chest area    Manual Lymphatic Drainage (MLD) pt performed self chest MLD including short neck, bilateral axillary LN, right groin LN, anterior interaxillary pathway, axilloinguinal pathway, right medial to lateral chest reworking pathways and right lateral trunk in supine and  reworking pathways.  She did an excellent job.    Passive ROM PROM right shoulder flex, scaption, abduction, ER  PT Short Term Goals - 12/25/20 1116      PT SHORT TERM GOAL #1   Title Right shoulder Abd will improve to 120 degrees for improved reaching abilty    Baseline 156    Time 3    Period Weeks    Status New    Target Date 01/15/21             PT Long Term Goals - 01/25/21 1101      PT LONG TERM GOAL #1   Title Pt will restore shoulder Right ROM to pre-op levels for improved ability to perform home and recreational activities    Period Weeks    Status Achieved      PT LONG TERM GOAL #2   Title Pt will be independent in HEP to improve shoulder ROM/strength    Period Weeks    Status Achieved      PT LONG TERM GOAL #3   Title Pt will be independent in self MLD to right axillary/chest swelling    Time 3    Period Weeks    Status Achieved      PT LONG TERM GOAL #4   Title pt will have a good understanding of skin care and risk reduction practices     Time 4    Status Achieved      PT LONG TERM GOAL #5   Title Pt will be fit for compression sleeve/bra prn and will be independent in donning/doffing and wear    Baseline fit but not received    Time 6    Period Weeks    Status On-going                 Plan - 01/25/21 1057    Clinical Impression Statement Pt received her Belisse bra in the mail and brought it with her.  She tried bra on and we adjusted it.  Pt reported that the compression felt good.  We practice self MLD and she did very well requiring only a few VC's for proper LN activation.  Trunk swelling was visibly improved afterwards. Therapist performed MFR techniques to the chest and scar mobilizations to the chest to try to improve lymphatic flow there.  PROM was also performed to the right shoulder.  She is still awaiting her sleeve and this needs to be checked next visit.    Personal Factors and Comorbidities Comorbidity 2    Comorbidities right breast Mastectomy with SLNB, multi joint OA    Stability/Clinical Decision Making Stable/Uncomplicated    Rehab Potential Excellent    PT Frequency 2x / week    PT Duration 6 weeks    PT Treatment/Interventions ADLs/Self Care Home Management;Therapeutic exercise;Patient/family education;Scar mobilization;Passive range of motion;Manual lymph drainage;Manual techniques    PT Next Visit Plan ABC strength class,MFR/scar mobs, standing TB, check sleeve and see if pt likes bra    PT Home Exercise Plan 4 post op exs, ABC class, supine wand flex and scaption.NTS, supine scapular  theraband exs    Consulted and Agree with Plan of Care Patient           Patient will benefit from skilled therapeutic intervention in order to improve the following deficits and impairments:  Decreased knowledge of precautions,Postural dysfunction,Impaired UE functional use,Increased edema,Decreased range of motion,Pain,Decreased skin integrity,Decreased scar mobility  Visit Diagnosis: Aftercare following  surgery for neoplasm  Localized edema  Acute pain of right shoulder  Malignant neoplasm of upper-outer quadrant of right female breast, unspecified estrogen  receptor status [facility])     Problem List Patient Active Problem List   Diagnosis Date Noted  . Breast cancer, right ([place]) 11/28/2020  . Malignant neoplasm of upper-outer quadrant of right breast in female, estrogen receptor positive ([place]) 11/10/2020  . GERD (gastroesophageal reflux disease) 04/03/2020  . Osteoarthritis of multiple joints 04/03/2020  . Viral warts 07/13/2019  . Aortic atherosclerosis ([place]) 12/03/2018  . Dyslipidemia 12/03/2018  . Precordial chest pain 07/07/2015  . Pain in joint of right foot 05/16/2014  . Vitamin D deficiency 07/08/2013  . Mitral valve prolapse syndrome, history of mild 01/05/2013  . Seasonal allergies   . Anxiety   . Depression, recurrent ([place])   . Hyperlipidemia     Claris Pong 01/25/2021, 12:06 PM  [place], [place], [address] Phone: [phone]   Fax:  [phone]  Name: Kelly George MRN: [id number] Date of Birth: December 30, 1945 Cheral Almas, PT 01/25/21 12:08 PM

## 2021-02-05 ENCOUNTER — Ambulatory Visit: Payer: Medicare Other

## 2021-02-12 ENCOUNTER — Other Ambulatory Visit: Payer: Self-pay

## 2021-02-12 ENCOUNTER — Ambulatory Visit: Payer: Medicare Other | Attending: General Surgery

## 2021-02-12 DIAGNOSIS — Z483 Aftercare following surgery for neoplasm: Secondary | ICD-10-CM | POA: Diagnosis not present

## 2021-02-12 DIAGNOSIS — C50411 Malignant neoplasm of upper-outer quadrant of right female breast: Secondary | ICD-10-CM | POA: Insufficient documentation

## 2021-02-12 DIAGNOSIS — M25511 Pain in right shoulder: Secondary | ICD-10-CM | POA: Diagnosis not present

## 2021-02-12 DIAGNOSIS — R600 Localized edema: Secondary | ICD-10-CM | POA: Diagnosis not present

## 2021-02-12 NOTE — Therapy (Signed)
[place], [place], [address] Phone: [phone]   Fax:  [phone]  Physical Therapy Treatment  Patient Details  Name: Kelly George MRN: [id number] Date of Birth: Jan 17, 1946 Referring Provider (PT): Dr. Donne Hazel   Encounter Date: 02/12/2021   PT End of Session - 02/12/21 0953    Visit Number 9    Number of Visits 14    Date for PT Re-Evaluation 02/12/21    PT Start Time 0812    PT Stop Time 0906    PT Time Calculation (min) 54 min    Activity Tolerance Patient tolerated treatment well    Behavior During Therapy [facility] for tasks assessed/performed           Past Medical History:  Diagnosis Date  . Anxiety   . Depression   . Heart murmur   . Hyperlipidemia   . RBBB   . Seasonal allergies     Past Surgical History:  Procedure Laterality Date  . BREAST EXCISIONAL BIOPSY Left   . COLONOSCOPY  07/07/01  . MASTECTOMY W/ SENTINEL NODE BIOPSY Right 11/28/2020   Procedure: RIGHT MASTECTOMY WITH RIGHT AXILLARY SENTINEL LYMPH NODE BIOPSY;  Surgeon: Rolm Bookbinder, MD;  Location: [place];  Service: General;  Laterality: Right;  RNFA, PEC BLOCK  . SKIN SURGERY     DERM - nasal   . TOOTH EXTRACTION    . TUBAL LIGATION      There were no vitals filed for this visit.   Subjective Assessment - 02/12/21 0812    Subjective 12 min late. things are going well.  I think the swelling is improving, but yesterday I could feel the cording pretty strong for 30 seconds and then it went away. I am not limited with anything I don't think. I have been doing the lymph drainage and the bands too. The swelling is better, but still there.  I have been wearing the prosthesis and sometimes a regular bra or my compression bra. I got the sleeve/gauntlet in the mail finally.   Pertinent History Pt was diagnosed on December 23,2021 with Right breast CA.  She had  Right mastectomy with SLNB on 11/28/2020  with Dr. Donne Hazel.  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  She had 2 LN removed, both negative. She is here for post surgical screening.  She lives on [address] and rides horses 4 days/week.    Patient Stated Goals Here for post surgical assessment    Currently in Pain? 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 Donne Hazel    Onset Date/Surgical Date 11/28/20      AROM   Right Shoulder Extension 70 Degrees    Right Shoulder Flexion 150 Degrees    Right Shoulder ABduction 165 Degrees             LYMPHEDEMA/ONCOLOGY QUESTIONNAIRE - 02/12/21 0001      Type   Cancer Type Right breast CA      Surgeries   Mastectomy Date 11/28/20    Axillary Lymph Node Dissection Date 11/28/20      Right Upper Extremity Lymphedema   15 cm Proximal to Olecranon Process 31.5 cm  10 cm Proximal to Olecranon Process 30.5 cm    Olecranon Process 27.1 cm    15 cm Proximal to Ulnar Styloid Process 23.8 cm    10 cm Proximal to Ulnar Styloid Process 204 cm    Just Proximal to Ulnar Styloid Process 16.3 cm    Across Hand at [employer] 20.2 cm    At [place] of 2nd Digit 7 cm                      [facility] Adult PT Treatment/Exercise - 02/12/21 0001      Shoulder Exercises: Pulleys   Flexion 2 minutes    ABduction 2 minutes                    PT Short Term Goals - 02/12/21 0856      PT SHORT TERM GOAL #1   Title Right shoulder Abd will improve to 120 degrees for improved reaching abilty    Baseline 156    Period Weeks    Status Achieved             PT Long Term Goals - 02/12/21 0856      PT LONG TERM GOAL #1   Title Pt will restore shoulder Right ROM to pre-op levels for improved ability to perform home and recreational activities     Time 6    Period Weeks    Status Achieved      PT LONG TERM GOAL #2   Title Pt will be independent in HEP to improve shoulder ROM/strength    Time 4    Period Weeks      PT LONG TERM GOAL #3   Title Pt will be independent in self MLD to right axillary/chest swelling    Time 3    Period Weeks    Status Achieved      PT LONG TERM GOAL #4   Title pt will have a good understanding of skin care and risk reduction practices    Time 4    Period Weeks    Status Achieved      PT LONG TERM GOAL #5   Title Pt will be fit for compression sleeve/bra prn and will be independent in donning/doffing and wear    Time 6    Period Weeks    Status Achieved                 Plan - 02/12/21 0954    Clinical Impression Statement Pt has achieved all goals established at initial evaluation and pt does not feel she has any limitations at this point except heavy lifting and putting on her horses saddle.  She has achieved shoulder ROM goals, She is independent in MLD, and we reviewed again skin care and precautions.  She brought her compression sleeves in and she was instructed in proper donning/doffing with rubber gloves.  She was able to apply independently. The beige sleeve was a different color from the gauntlet and I will follow up for her about that.  She does still have mild swelling distal to the mastectomy incision and she was advised to wear her compression bra with the chip pack and continue MLD for this area. There is no swelling noted in the right UE.She is discharged from formal physical therapy.    Personal Factors and Comorbidities Comorbidity 2    Comorbidities right breast Mastectomy with SLNB, multi joint OA    Stability/Clinical Decision Making Stable/Uncomplicated  Rehab Potential Excellent    PT Frequency 2x / week    PT Duration 6 weeks    PT Treatment/Interventions ADLs/Self Care Home Management;Therapeutic exercise;Patient/family education;Scar mobilization;Passive range of  motion;Manual lymph drainage;Manual techniques    PT Next Visit Plan Discharged to HEP    PT Home Exercise Plan 4 post op exs, ABC class, supine wand flex and scaption.NTS, supine scapulat theraband exs    Recommended Other Services compression bra and sleeve prn    Consulted and Agree with Plan of Care Patient           Patient will benefit from skilled therapeutic intervention in order to improve the following deficits and impairments:  Decreased knowledge of precautions,Postural dysfunction,Impaired UE functional use,Increased edema,Decreased range of motion,Pain,Decreased skin integrity,Decreased scar mobility  Visit Diagnosis: Aftercare following surgery for neoplasm  Localized edema  Acute pain of right shoulder  Malignant neoplasm of upper-outer quadrant of right female breast, unspecified estrogen receptor status ([place])     Problem List Patient Active Problem List   Diagnosis Date Noted  . Breast cancer, right ([place]) 11/28/2020  . Malignant neoplasm of upper-outer quadrant of right breast in female, estrogen receptor positive ([place]) 11/10/2020  . GERD (gastroesophageal reflux disease) 04/03/2020  . Osteoarthritis of multiple joints 04/03/2020  . Viral warts 07/13/2019  . Aortic atherosclerosis ([place]) 12/03/2018  . Dyslipidemia 12/03/2018  . Precordial chest pain 07/07/2015  . Pain in joint of right foot 05/16/2014  . Vitamin D deficiency 07/08/2013  . Mitral valve prolapse syndrome, history of mild 01/05/2013  . Seasonal allergies   . Anxiety   . Depression, recurrent ([place])   . Hyperlipidemia    PHYSICAL THERAPY DISCHARGE SUMMARY  Visits from Start of Care: 9  Current functional level related to goals / functional outcomes: Achieved all goals   Remaining deficits: Mild swelling distal to incision   Education / Equipment: Compression bra, compression sleeve/gauntlet  Plan: Patient agrees to discharge.  Patient goals were met. Patient is being discharged due  to meeting the stated rehab goals.  ?????      Claris Pong 02/12/2021, 10:01 AM  [place], [place], [address] Phone: [phone]   Fax:  [phone]  Name: Natarsha Hurwitz MRN: [id number] Date of Birth: 14-Feb-1946 Cheral Almas, PT 02/12/21 10:04 AM

## 2021-02-19 ENCOUNTER — Ambulatory Visit (INDEPENDENT_AMBULATORY_CARE_PROVIDER_SITE_OTHER): Payer: Medicare Other | Admitting: Family Medicine

## 2021-02-19 ENCOUNTER — Encounter: Payer: Self-pay | Admitting: Family Medicine

## 2021-02-19 ENCOUNTER — Other Ambulatory Visit: Payer: Self-pay

## 2021-02-19 VITALS — BP 118/80 | HR 66 | Temp 98.1°F | Ht 67.5 in | Wt 181.9 lb

## 2021-02-19 DIAGNOSIS — R202 Paresthesia of skin: Secondary | ICD-10-CM

## 2021-02-19 NOTE — Progress Notes (Signed)
Established Patient Office Visit  Subjective:  Patient ID: Kelly George, female    DOB: 19-Jul-1946  Age: 75 y.o. MRN: [id number]  CC:  Chief Complaint  Patient presents with  . Numbness    Patient complains of left arm numbness, from shoulder to fingers x1 week, denies injury  . Shortness of Breath    Noticed more since right masectomy on February 15th    HPI Kelly George presents for intermittent left upper extremity numbness for several months now.  She initially complained of pain but on further questioning this really sounds more like paresthesias left upper extremity mostly involving the hand first through third digits and extending up toward the arm.  She notes her symptoms are worse with driving and at night.  Denies any significant weakness.  No neck pain.  No lower extremity symptoms.  No headaches.  No recent speech changes.  No known history of carpal tunnel syndrome.  She was diagnosed with breast cancer back in the winter and had mastectomy back in February.  She is now on Arimidex.  Past Medical History:  Diagnosis Date  . Anxiety   . Depression   . Heart murmur   . Hyperlipidemia   . RBBB   . Seasonal allergies     Past Surgical History:  Procedure Laterality Date  . BREAST EXCISIONAL BIOPSY Left   . COLONOSCOPY  07/07/01  . MASTECTOMY W/ SENTINEL NODE BIOPSY Right 11/28/2020   Procedure: RIGHT MASTECTOMY WITH RIGHT AXILLARY SENTINEL LYMPH NODE BIOPSY;  Surgeon: Rolm Bookbinder, MD;  Location: [place] [facility];  Service: General;  Laterality: Right;  RNFA, PEC BLOCK  . SKIN SURGERY     DERM - nasal   . TOOTH EXTRACTION    . TUBAL LIGATION      Family History  Problem Relation Age of Onset  . Breast cancer Mother 52  . COPD Mother   . Diabetes Father   . Cancer Father        Brain  . Heart attack Father 22  . Alcohol abuse Father   . Depression Father   . Uterine cancer Maternal Aunt   . Hearing loss Maternal Grandmother   .  Hypertension Maternal Grandfather   . Kidney disease Maternal Grandfather   . Stroke Maternal Grandfather   . High Cholesterol Brother   . Hypertension Brother   . Hyperlipidemia Brother   . Autoimmune disease Daughter   . Ankylosing spondylitis Daughter   . Healthy Son   . Depression Son     Social History   Socioeconomic History  . Marital status: Divorced    Spouse name: Not on file  . Number of children: 2  . Years of education: Not on file  . Highest education level: Associate degree: academic program  Occupational History  . Occupation: Retired  Tobacco Use  . Smoking status: Never Smoker  . Smokeless tobacco: Never Used  Vaping Use  . Vaping Use: Never used  Substance and Sexual Activity  . Alcohol use: Yes    Alcohol/week: 22.0 standard drinks    Types: 8 Glasses of wine, 14 Cans of beer per week    Comment: daily  . Drug use: No  . Sexual activity: Not Currently  Other Topics Concern  . Not on file  Social History Narrative   Live alone.     Patient is right-handed. She lives alone in a 1 story house, 3-4 steps to enter.    Social Determinants of  Health   Financial Resource Strain: Low Risk   . Difficulty of Paying Living Expenses: Not hard at all  Food Insecurity: No Food Insecurity  . Worried About Charity fundraiser in the Last Year: Never true  . Ran Out of Food in the Last Year: Never true  Transportation Needs: No Transportation Needs  . Lack of Transportation (Medical): No  . Lack of Transportation (Non-Medical): No  Physical Activity: Inactive  . Days of Exercise per Week: 0 days  . Minutes of Exercise per Session: 0 min  Stress: No Stress Concern Present  . Feeling of Stress : Not at all  Social Connections: Socially Isolated  . Frequency of Communication with Friends and Family: More than three times a week  . Frequency of Social Gatherings with Friends and Family: Three times a week  . Attends Religious Services: Never  . Active Member  of Clubs or Organizations: No  . Attends Archivist Meetings: Never  . Marital Status: Divorced  Human resources officer Violence: Not At Risk  . Fear of Current or Ex-Partner: No  . Emotionally Abused: No  . Physically Abused: No  . Sexually Abused: No    Outpatient Medications Prior to Visit  Medication Sig Dispense Refill  . anastrozole (ARIMIDEX) 1 MG tablet Take 1 tablet (1 mg total) by mouth daily. 90 tablet 3  . Ascorbic Acid (VITAMIN C) 1000 MG tablet Take 1,000 mg by mouth daily.    . calcium-vitamin D (OSCAL WITH D) 500-200 MG-UNIT per tablet Take 1 tablet by mouth daily.    . Cholecalciferol (VITAMIN D3) 2000 UNITS TABS Take 5,000 Units by mouth daily.     . fish oil-omega-3 fatty acids 1000 MG capsule Take 1,200 mg by mouth daily.     Marland Kitchen glucosamine-chondroitin 500-400 MG tablet Take 1 tablet by mouth 2 (two) times daily.     . naproxen sodium (ANAPROX) 220 MG tablet Take 220 mg by mouth daily. TAKE TWO TABLETS ONCE DAILY    . simvastatin (ZOCOR) 10 MG tablet TAKE 1 TABLET BY MOUTH EVERYDAY AT BEDTIME 90 tablet 3  . TURMERIC PO Take by mouth.    . sertraline (ZOLOFT) 50 MG tablet Take 1 tablet (50 mg total) by mouth daily. 90 tablet 3  . methocarbamol (ROBAXIN) 750 MG tablet Take 1 tablet (750 mg total) by mouth every 8 (eight) hours as needed (use for muscle cramps/pain). 20 tablet 0  . traMADol (ULTRAM) 50 MG tablet Take 1 tablet (50 mg total) by mouth every 6 (six) hours as needed. 10 tablet 0   No facility-administered medications prior to visit.    Allergies  Allergen Reactions  . Penicillins Rash  . Tetracyclines & Related Palpitations    ROS Review of Systems  Constitutional: Negative for chills and fever.  Respiratory: Negative for shortness of breath.   Cardiovascular: Negative for chest pain.  Musculoskeletal: Negative for neck pain.  Neurological: Positive for numbness. Negative for weakness.      Objective:    Physical Exam Vitals reviewed.   Constitutional:      Appearance: She is well-developed.  Cardiovascular:     Rate and Rhythm: Normal rate and regular rhythm.  Pulmonary:     Effort: Pulmonary effort is normal.     Breath sounds: Normal breath sounds.  Musculoskeletal:     Comments: No significant muscle atrophy.  Tinel's sign is negative.  Neurological:     Mental Status: She is alert.     Comments:  She has excellent strength throughout upper extremities.  Upper extremity reflexes are symmetric.  Normal sensory function throughout with monofilament testing.     BP 118/80 (BP Location: Left Arm, Patient Position: Sitting, Cuff Size: Normal)   Pulse 66   Temp 98.1 F (36.7 C) (Oral)   Ht 5' 7.5" (1.715 m)   Wt 181 lb 14.4 oz (82.5 kg)   SpO2 94%   BMI 28.07 kg/m  Wt Readings from Last 3 Encounters:  02/19/21 181 lb 14.4 oz (82.5 kg)  12/15/20 182 lb (82.6 kg)  12/12/20 184 lb 8 oz (83.7 kg)     There are no preventive care reminders to display for this patient.  There are no preventive care reminders to display for this patient.  Lab Results  Component Value Date   TSH 2.150 11/18/2019   Lab Results  Component Value Date   WBC 4.0 11/18/2019   HGB 14.6 11/18/2019   HCT 43.4 11/18/2019   MCV 95 11/18/2019   PLT 262 11/18/2019   Lab Results  Component Value Date   NA 140 12/15/2020   K 5.0 12/15/2020   CO2 29 12/15/2020   GLUCOSE 98 12/15/2020   BUN 19 12/15/2020   CREATININE 0.58 12/15/2020   BILITOT 0.7 12/15/2020   ALKPHOS 66 12/15/2020   AST 18 12/15/2020   ALT 19 12/15/2020   PROT 6.6 12/15/2020   ALBUMIN 4.2 12/15/2020   CALCIUM 9.7 12/15/2020   GFR 87.83 12/15/2020   Lab Results  Component Value Date   CHOL 172 12/15/2020   Lab Results  Component Value Date   HDL 79.30 12/15/2020   Lab Results  Component Value Date   LDLCALC 82 12/15/2020   Lab Results  Component Value Date   TRIG 53.0 12/15/2020   Lab Results  Component Value Date   CHOLHDL 2 12/15/2020   No  results found for: HGBA1C    Assessment & Plan:   Intermittent paresthesias left upper extremity with involvement of first through third digits predominantly.  Suspect she has some element of carpal tunnel syndrome.  She is not describing neck pain to suggest cervical radiculitis.  Symptoms seem to be worse at night and with driving which suggest peripheral origin such as carpal tunnel  -We recommend trial of wrist bracing for the left wrist especially at night but also during the day as much as tolerated for the next few weeks -Be in touch if not improving with that and will recommend neurology referral for possible nerve conduction velocities and further evaluation  No orders of the defined types were placed in this encounter.   Follow-up: No follow-ups on file.    Carolann Littler, MD

## 2021-02-19 NOTE — Patient Instructions (Signed)
Carpal Tunnel Syndrome  Carpal tunnel syndrome is a condition that causes pain, numbness, and weakness in your hand and fingers. The carpal tunnel is a narrow area located on the palm side of your wrist. Repeated wrist motion or certain diseases may cause swelling within the tunnel. This swelling pinches the main nerve in the wrist. The main nerve in the wrist is called the median nerve. What are the causes? This condition may be caused by:  Repeated and forceful wrist and hand motions.  Wrist injuries.  Arthritis.  A cyst or tumor in the carpal tunnel.  Fluid buildup during pregnancy.  Use of tools that vibrate. Sometimes the cause of this condition is not known. What increases the risk? The following factors may make you more likely to develop this condition:  Having a job that requires you to repeatedly or forcefully move your wrist or hand or requires you to use tools that vibrate. This may include jobs that involve using computers, working on an [employer], or working with [place] such as Pension scheme manager.  Being a woman.  Having certain conditions, such as: ? Diabetes. ? Obesity. ? An underactive thyroid (hypothyroidism). ? Kidney failure. ? Rheumatoid arthritis. What are the signs or symptoms? Symptoms of this condition include:  A tingling feeling in your fingers, especially in your thumb, index, and middle fingers.  Tingling or numbness in your hand.  An aching feeling in your entire arm, especially when your wrist and elbow are bent for a long time.  Wrist pain that goes up your arm to your shoulder.  Pain that goes down into your palm or fingers.  A weak feeling in your hands. You may have trouble grabbing and holding items. Your symptoms may feel worse during the night. How is this diagnosed? This condition is diagnosed with a medical history and physical exam. You may also have tests, including:  Electromyogram (EMG). This test measures electrical  signals sent by your nerves into the muscles.  Nerve conduction study. This test measures how well electrical signals pass through your nerves.  Imaging tests, such as X-rays, ultrasound, and MRI. These tests check for possible causes of your condition. How is this treated? This condition may be treated with:  Lifestyle changes. It is important to stop or change the activity that caused your condition.  Doing exercise and activities to strengthen and stretch your muscles and tendons (physical therapy).  Making lifestyle changes to help with your condition and learning how to do your daily activities safely (occupational therapy).  Medicines for pain and inflammation. This may include medicine that is injected into your wrist.  A wrist splint or brace.  Surgery. Follow these instructions at home: If you have a splint or brace:  Wear the splint or brace as told by your health care provider. Remove it only as told by your health care provider.  Loosen the splint or brace if your fingers tingle, become numb, or turn cold and blue.  Keep the splint or brace clean.  If the splint or brace is not waterproof: ? Do not let it get wet. ? Cover it with a watertight covering when you take a bath or shower. Managing pain, stiffness, and swelling If directed, put ice on the painful area. To do this:  If you have a removeable splint or brace, remove it as told by your health care provider.  Put ice in a plastic bag.  Place a towel between your skin and the bag  or between the splint or brace and the bag.  Leave the ice on for 20 minutes, 2-3 times a day. Do not fall asleep with the cold pack on your skin.  Remove the ice if your skin turns bright red. This is very important. If you cannot feel pain, heat, or cold, you have a greater risk of damage to the area. Move your fingers often to reduce stiffness and swelling.   General instructions  Take over-the-counter and prescription  medicines only as told by your health care provider.  Rest your wrist and hand from any activity that may be causing your pain. If your condition is work related, talk with your employer about changes that can be made, such as getting a wrist pad to use while typing.  Do any exercises as told by your health care provider, physical therapist, or occupational therapist.  Keep all follow-up visits. This is important. Contact a health care provider if:  You have new symptoms.  Your pain is not controlled with medicines.  Your symptoms get worse. Get help right away if:  You have severe numbness or tingling in your wrist or hand. Summary  Carpal tunnel syndrome is a condition that causes pain, numbness, and weakness in your hand and fingers.  It is usually caused by repeated wrist motions.  Lifestyle changes and medicines are used to treat carpal tunnel syndrome. Surgery may be recommended.  Follow your health care provider's instructions about wearing a splint, resting from activity, keeping follow-up visits, and calling for help. This information is not intended to replace advice given to you by your health care provider. Make sure you discuss any questions you have with your health care provider. Document Revised: 02/10/2020 Document Reviewed: 02/10/2020 Elsevier Patient Education  2021 [place].  Try the left wrist brace at night and as much as possible during the day for the next few weeks and be in touch if not improving.

## 2021-02-26 DIAGNOSIS — L810 Postinflammatory hyperpigmentation: Secondary | ICD-10-CM | POA: Diagnosis not present

## 2021-02-26 DIAGNOSIS — L814 Other melanin hyperpigmentation: Secondary | ICD-10-CM | POA: Diagnosis not present

## 2021-02-26 DIAGNOSIS — L821 Other seborrheic keratosis: Secondary | ICD-10-CM | POA: Diagnosis not present

## 2021-02-26 DIAGNOSIS — L905 Scar conditions and fibrosis of skin: Secondary | ICD-10-CM | POA: Diagnosis not present

## 2021-02-26 DIAGNOSIS — Z85828 Personal history of other malignant neoplasm of skin: Secondary | ICD-10-CM | POA: Diagnosis not present

## 2021-02-26 DIAGNOSIS — L570 Actinic keratosis: Secondary | ICD-10-CM | POA: Diagnosis not present

## 2021-02-26 DIAGNOSIS — D225 Melanocytic nevi of trunk: Secondary | ICD-10-CM | POA: Diagnosis not present

## 2021-02-26 DIAGNOSIS — Z872 Personal history of diseases of the skin and subcutaneous tissue: Secondary | ICD-10-CM | POA: Diagnosis not present

## 2021-02-26 DIAGNOSIS — C44319 Basal cell carcinoma of skin of other parts of face: Secondary | ICD-10-CM | POA: Diagnosis not present

## 2021-02-26 DIAGNOSIS — D485 Neoplasm of uncertain behavior of skin: Secondary | ICD-10-CM | POA: Diagnosis not present

## 2021-03-05 ENCOUNTER — Ambulatory Visit: Payer: Medicare Other

## 2021-03-29 ENCOUNTER — Other Ambulatory Visit: Payer: Self-pay

## 2021-03-30 ENCOUNTER — Encounter: Payer: Self-pay | Admitting: Family Medicine

## 2021-03-30 ENCOUNTER — Ambulatory Visit (INDEPENDENT_AMBULATORY_CARE_PROVIDER_SITE_OTHER): Payer: Medicare Other | Admitting: Family Medicine

## 2021-03-30 VITALS — BP 120/82 | HR 61 | Temp 98.4°F | Wt 181.8 lb

## 2021-03-30 DIAGNOSIS — H1013 Acute atopic conjunctivitis, bilateral: Secondary | ICD-10-CM

## 2021-03-30 DIAGNOSIS — H0014 Chalazion left upper eyelid: Secondary | ICD-10-CM

## 2021-03-30 NOTE — Progress Notes (Signed)
Subjective:    Patient ID: Kelly George, female    DOB: [birth year], 75 y.o.   MRN: [id number]  Chief Complaint  Patient presents with   bump on eyelid    Noticed it Wednesday, states it itches but not painful.    HPI Patient was seen today for acute concern.  Patient endorses bump 2 days, nonpainful.   Pt also notes b/l itching of eyes.  Denies seeing any pustules, having crusting of eyelids, changes in vision, rhinorrhea.  Patient notes history of hayfever many years ago.  Has not tried anything for her symptoms.  Past Medical History:  Diagnosis Date   Anxiety    Depression    Heart murmur    Hyperlipidemia    RBBB    Seasonal allergies     Allergies  Allergen Reactions   Penicillins Rash   Tetracyclines & Related Palpitations    ROS General: Denies fever, chills, night sweats, changes in weight, changes in appetite HEENT: Denies headaches, ear pain, changes in vision, rhinorrhea, sore throat +itchy eyes CV: Denies CP, palpitations, SOB, orthopnea Pulm: Denies SOB, cough, wheezing GI: Denies abdominal pain, nausea, vomiting, diarrhea, constipation GU: Denies dysuria, hematuria, frequency, vaginal discharge Msk: Denies muscle cramps, joint pains Neuro: Denies weakness, numbness, tingling Skin: Denies rashes, bruising  +bump on L upper eye lid Psych: Denies depression, anxiety, hallucinations      Objective:    Blood pressure 120/82, pulse 61, temperature 98.4 F (36.9 C), temperature source Oral, weight 181 lb 12.8 oz (82.5 kg), SpO2 94 %.  Gen. Pleasant, well-nourished, in no distress, normal affect   HEENT: [place]/AT, face symmetric, Erythematous bump on upper L eye lid medially. conjunctiva clear, no scleral icterus, PERRLA, EOMI, nares patent without drainage. Lungs: no accessory muscle use Cardiovascular: RRR, no peripheral edema Musculoskeletal: No deformities, no cyanosis or clubbing, normal tone Neuro:  A&Ox3, CN II-XII intact, normal gait Skin:  Warm,  no lesions/ rash.  Mildly erythematous raised papule on L upper eyelid medially with a central punctum with small eschar present.  No drainage noted.   Wt Readings from Last 3 Encounters:  03/30/21 181 lb 12.8 oz (82.5 kg)  02/19/21 181 lb 14.4 oz (82.5 kg)  12/15/20 182 lb (82.6 kg)    Lab Results  Component Value Date   WBC 4.0 11/18/2019   HGB 14.6 11/18/2019   HCT 43.4 11/18/2019   PLT 262 11/18/2019   GLUCOSE 98 12/15/2020   CHOL 172 12/15/2020   TRIG 53.0 12/15/2020   HDL 79.30 12/15/2020   LDLCALC 82 12/15/2020   ALT 19 12/15/2020   AST 18 12/15/2020   NA 140 12/15/2020   K 5.0 12/15/2020   CL 104 12/15/2020   CREATININE 0.58 12/15/2020   BUN 19 12/15/2020   CO2 29 12/15/2020   TSH 2.150 11/18/2019    Assessment/Plan:  Chalazion of left upper eyelid  Allergic conjunctivitis of both eyes  -Advised to lesion on upper left eyelid likely 2/2 chalazion versus insect bite given presence of central punctum -discussed supportive care including warm compresses QID, cleansing eyelid with gentle no tears baby shampoo and Q-tip. -avoid rubbing eye -OTC allergy eyedrops to relieve itching OTC p.o. allergy medication such as Allegra or Claritin. -For continued or worsening symptoms over the weekend follow-up  F/u prn  Grier Mitts, MD

## 2021-03-30 NOTE — Patient Instructions (Signed)
Warm compresses Gentle no tears baby shampoo to cleanse eyelids Allergy eye gtts

## 2021-04-02 ENCOUNTER — Other Ambulatory Visit: Payer: Self-pay | Admitting: Hematology and Oncology

## 2021-04-02 DIAGNOSIS — E2839 Other primary ovarian failure: Secondary | ICD-10-CM

## 2021-04-04 DIAGNOSIS — D485 Neoplasm of uncertain behavior of skin: Secondary | ICD-10-CM | POA: Diagnosis not present

## 2021-04-04 DIAGNOSIS — L723 Sebaceous cyst: Secondary | ICD-10-CM | POA: Diagnosis not present

## 2021-04-05 ENCOUNTER — Ambulatory Visit
Admission: RE | Admit: 2021-04-05 | Discharge: 2021-04-05 | Disposition: A | Discharge status: Home / Self Care | Payer: Medicare Other | Source: Ambulatory Visit | Attending: Hematology and Oncology | Admitting: Hematology and Oncology

## 2021-04-05 ENCOUNTER — Other Ambulatory Visit: Payer: Self-pay

## 2021-04-05 DIAGNOSIS — M85832 Other specified disorders of bone density and structure, left forearm: Secondary | ICD-10-CM | POA: Diagnosis not present

## 2021-04-05 DIAGNOSIS — E2839 Other primary ovarian failure: Secondary | ICD-10-CM

## 2021-04-05 DIAGNOSIS — Z780 Asymptomatic menopausal state: Secondary | ICD-10-CM | POA: Diagnosis not present

## 2021-04-09 ENCOUNTER — Ambulatory Visit: Payer: Medicare Other | Attending: General Surgery

## 2021-04-09 ENCOUNTER — Other Ambulatory Visit: Payer: Self-pay

## 2021-04-09 DIAGNOSIS — Z483 Aftercare following surgery for neoplasm: Secondary | ICD-10-CM | POA: Insufficient documentation

## 2021-04-09 NOTE — Therapy (Signed)
[place], [place], [address] Phone: [phone]   Fax:  [phone]  Physical Therapy Treatment  Patient Details  Name: Kelly George MRN: [id number] Date of Birth: Apr 29, 1946 Referring Provider (PT): Dr. Donne Hazel   Encounter Date: 04/09/2021   PT End of Session - 04/09/21 1027     Visit Number 9   # unchanged due to screen only   PT Start Time 1015    PT Stop Time 1024    PT Time Calculation (min) 9 min    Activity Tolerance Patient tolerated treatment well    Behavior During Therapy [facility] for tasks assessed/performed             Past Medical History:  Diagnosis Date   Anxiety    Depression    Heart murmur    Hyperlipidemia    RBBB    Seasonal allergies     Past Surgical History:  Procedure Laterality Date   BREAST EXCISIONAL BIOPSY Left    COLONOSCOPY  07/07/01   MASTECTOMY W/ SENTINEL NODE BIOPSY Right 11/28/2020   Procedure: RIGHT MASTECTOMY WITH RIGHT AXILLARY SENTINEL LYMPH NODE BIOPSY;  Surgeon: Rolm Bookbinder, MD;  Location: [facility];  Service: General;  Laterality: Right;  RNFA, PEC BLOCK   SKIN SURGERY     DERM - nasal    TOOTH EXTRACTION     TUBAL LIGATION      There were no vitals filed for this visit.   Subjective Assessment - 04/09/21 1016     Subjective Pt returns for her 3 month L-Dex screen.    Pertinent History Pt was diagnosed on December 23,2021 with Right breast CA.  She had  Right mastectomy with SLNB on 11/28/2020 with Dr. Donne Hazel.  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  She had 2 LN removed, both negative. She is here for post surgical screening.  She lives on [address] and rides horses 4 days/week.                    L-DEX FLOWSHEETS - 04/09/21 1000       L-DEX LYMPHEDEMA SCREENING   Measurement Type Unilateral    L-DEX MEASUREMENT  EXTREMITY Upper Extremity    POSITION  Standing    DOMINANT SIDE Right    At Risk Side Right    BASELINE SCORE (UNILATERAL) 4.5    L-DEX SCORE (UNILATERAL) 5.7    VALUE CHANGE (UNILAT) 1.2                                 PT Short Term Goals - 02/12/21 0856       PT SHORT TERM GOAL #1   Title Right shoulder Abd will improve to 120 degrees for improved reaching abilty    Baseline 156    Period Weeks    Status Achieved               PT Long Term Goals - 02/12/21 1941       PT LONG TERM GOAL #1   Title Pt will restore shoulder Right ROM to pre-op levels for improved ability to perform home and recreational activities    Time 6    Period Weeks    Status Achieved      PT LONG TERM GOAL #2   Title Pt  will be independent in HEP to improve shoulder ROM/strength    Time 4    Period Weeks      PT LONG TERM GOAL #3   Title Pt will be independent in self MLD to right axillary/chest swelling    Time 3    Period Weeks    Status Achieved      PT LONG TERM GOAL #4   Title pt will have a good understanding of skin care and risk reduction practices    Time 4    Period Weeks    Status Achieved      PT LONG TERM GOAL #5   Title Pt will be fit for compression sleeve/bra prn and will be independent in donning/doffing and wear    Time 6    Period Weeks    Status Achieved                   Plan - 04/09/21 1028     Clinical Impression Statement Pt returns for her 3 month L-Dex screen. Her change from baseline of 1.2 is WNLs so no further treatment is required at this time except to cont every 3 month L-Dex screens which pt is agreeable to .    PT Next Visit Plan Cont every 3 month L-Dex screens for up to 2 years from her SLNB.    Consulted and Agree with Plan of Care Patient             Patient will benefit from skilled therapeutic intervention in order to improve the following deficits and impairments:     Visit Diagnosis: Aftercare  following surgery for neoplasm     Problem List Patient Active Problem List   Diagnosis Date Noted   Breast cancer, right ([place]) 11/28/2020   Malignant neoplasm of upper-outer quadrant of right breast in female, estrogen receptor positive ([place]) 11/10/2020   GERD (gastroesophageal reflux disease) 04/03/2020   Osteoarthritis of multiple joints 04/03/2020   Viral warts 07/13/2019   Aortic atherosclerosis ([place]) 12/03/2018   Dyslipidemia 12/03/2018   Precordial chest pain 07/07/2015   Pain in joint of right foot 05/16/2014   Vitamin D deficiency 07/08/2013   Mitral valve prolapse syndrome, history of mild 01/05/2013   Seasonal allergies    Anxiety    Depression, recurrent ([place])    Hyperlipidemia     [name], PTA 04/09/2021, 10:29 AM  [place], [place], [address] Phone: [phone]   Fax:  [phone]  Name: Kelly George MRN: [id number] Date of Birth: [birth year]

## 2021-04-10 ENCOUNTER — Ambulatory Visit: Payer: Medicare Other

## 2021-04-13 ENCOUNTER — Encounter: Payer: Self-pay | Admitting: Adult Health

## 2021-04-13 ENCOUNTER — Inpatient Hospital Stay: Payer: Medicare Other | Attending: Adult Health | Admitting: Adult Health

## 2021-04-13 ENCOUNTER — Other Ambulatory Visit: Payer: Self-pay

## 2021-04-13 VITALS — BP 107/67 | HR 71 | Temp 97.8°F | Resp 18 | Ht 67.5 in | Wt 184.7 lb

## 2021-04-13 DIAGNOSIS — Z803 Family history of malignant neoplasm of breast: Secondary | ICD-10-CM | POA: Insufficient documentation

## 2021-04-13 DIAGNOSIS — Z9011 Acquired absence of right breast and nipple: Secondary | ICD-10-CM | POA: Diagnosis not present

## 2021-04-13 DIAGNOSIS — Z79899 Other long term (current) drug therapy: Secondary | ICD-10-CM | POA: Diagnosis not present

## 2021-04-13 DIAGNOSIS — Z170 Estrogen receptor positive status [ER+]: Secondary | ICD-10-CM

## 2021-04-13 DIAGNOSIS — Z8349 Family history of other endocrine, nutritional and metabolic diseases: Secondary | ICD-10-CM | POA: Diagnosis not present

## 2021-04-13 DIAGNOSIS — Z8049 Family history of malignant neoplasm of other genital organs: Secondary | ICD-10-CM | POA: Diagnosis not present

## 2021-04-13 DIAGNOSIS — E785 Hyperlipidemia, unspecified: Secondary | ICD-10-CM | POA: Diagnosis not present

## 2021-04-13 DIAGNOSIS — M8580 Other specified disorders of bone density and structure, unspecified site: Secondary | ICD-10-CM | POA: Insufficient documentation

## 2021-04-13 DIAGNOSIS — Z808 Family history of malignant neoplasm of other organs or systems: Secondary | ICD-10-CM | POA: Diagnosis not present

## 2021-04-13 DIAGNOSIS — Z8249 Family history of ischemic heart disease and other diseases of the circulatory system: Secondary | ICD-10-CM | POA: Diagnosis not present

## 2021-04-13 DIAGNOSIS — C50411 Malignant neoplasm of upper-outer quadrant of right female breast: Secondary | ICD-10-CM | POA: Diagnosis not present

## 2021-04-13 DIAGNOSIS — Z833 Family history of diabetes mellitus: Secondary | ICD-10-CM | POA: Diagnosis not present

## 2021-04-13 DIAGNOSIS — Z79811 Long term (current) use of aromatase inhibitors: Secondary | ICD-10-CM | POA: Diagnosis not present

## 2021-04-13 NOTE — Progress Notes (Signed)
SURVIVORSHIP VISIT:   BRIEF ONCOLOGIC HISTORY:  Oncology History  Malignant neoplasm of upper-outer quadrant of right breast in female, estrogen receptor positive ([place])  11/10/2020 Initial Diagnosis   Screening mammogram showed calcifications and a possible mass in the right breast. Diagnostic mammogram and US showed in the right breast, a 0.9cm cyst at the 8 o'clock position, a 1.0cm lesion at the 12 o'clock position, and no right axillary adenopathy. 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   11/28/2020 Cancer Staging   Staging form: Breast, AJCC 8th Edition - Pathologic stage from 11/28/2020: Stage IA (pT1c, pN0, cM0, G2, ER+, PR+, HER2-) - Signed by Gardenia Phlegm, NP on 04/10/2021  Stage prefix: Initial diagnosis  Histologic grading system: 3 grade system    12/2020 -  Anti-estrogen oral therapy   Anastrozole daily     INTERVAL HISTORY:  Kelly George to review her survivorship care plan detailing her treatment course for breast cancer, as well as monitoring long-term side effects of that treatment, education regarding health maintenance, screening, and overall wellness and health promotion.     Overall, Kelly George reports feeling quite well.  She has been fatigued since prior to her breast cancer surgery.  She note she is sleeping more now, and says her fatigue is 7/10.  She says that she is not snoring, and doesn't think she has any issues with  sleep apnea.  She says she drinks beer daily.  She does note some increased stress, memory changes, and financial issues.  She is otherwise doing well today.  She has been taking anastrozole and is tolerating it well.  She says that she cannot tell that she is taking it.  She is otherwise doing well today and a detailed ROS was non-contributory.    REVIEW OF SYSTEMS:  Review of Systems  Constitutional:  Negative for appetite change, chills, fatigue, fever and unexpected weight change.  HENT:   Negative for hearing loss, lump/mass and trouble swallowing.   Eyes:  Negative for eye problems and icterus.  Respiratory:  Negative for chest tightness, cough and shortness of breath.   Cardiovascular:  Negative for chest pain, leg swelling and palpitations.  Gastrointestinal:  Negative for abdominal distention, abdominal pain, constipation, diarrhea, nausea and vomiting.  Endocrine: Negative for hot flashes.  Genitourinary:  Negative for difficulty urinating.   Musculoskeletal:  Negative for arthralgias.  Skin:  Negative for itching and rash.  Neurological:  Negative for dizziness, extremity weakness, headaches and numbness.  Hematological:  Negative for adenopathy. Does not bruise/bleed easily.  Psychiatric/Behavioral:  Positive for decreased concentration and sleep disturbance. Negative for depression. The patient is nervous/anxious.   Breast: Denies any new nodularity, masses, tenderness, nipple changes, or nipple discharge.    ONCOLOGY TREATMENT TEAM:  1. Surgeon:  Dr. Chrissie Noa at [facility] 2. Medical Oncologist: Dr. Lindi Adie      PAST MEDICAL/SURGICAL HISTORY:  Past Medical History:  Diagnosis Date   Anxiety  Depression    Heart murmur    Hyperlipidemia    RBBB    Seasonal allergies    Past Surgical History:  Procedure Laterality Date   BREAST EXCISIONAL BIOPSY Left    COLONOSCOPY  07/07/01   MASTECTOMY W/ SENTINEL NODE BIOPSY Right 11/28/2020   Procedure: RIGHT  MASTECTOMY WITH RIGHT AXILLARY SENTINEL LYMPH NODE BIOPSY;  Surgeon: Rolm Bookbinder, MD;  Location: [place];  Service: General;  Laterality: Right;  RNFA, PEC BLOCK   SKIN SURGERY     DERM - nasal    TOOTH EXTRACTION     TUBAL LIGATION       ALLERGIES:  Allergies  Allergen Reactions   Penicillins Rash   Tetracyclines & Related Palpitations     CURRENT MEDICATIONS:  Outpatient Encounter Medications as of 04/13/2021  Medication Sig   anastrozole (ARIMIDEX) 1 MG tablet Take 1 tablet (1 mg total) by mouth daily.   Ascorbic Acid (VITAMIN C) 1000 MG tablet Take 1,000 mg by mouth daily.   calcium-vitamin D (OSCAL WITH D) 500-200 MG-UNIT per tablet Take 1 tablet by mouth daily.   Cholecalciferol (VITAMIN D3) 2000 UNITS TABS Take 5,000 Units by mouth daily.    fish oil-omega-3 fatty acids 1000 MG capsule Take 1,200 mg by mouth daily.    glucosamine-chondroitin 500-400 MG tablet Take 1 tablet by mouth 2 (two) times daily.    naproxen sodium (ANAPROX) 220 MG tablet Take 220 mg by mouth daily. TAKE TWO TABLETS ONCE DAILY   sertraline (ZOLOFT) 50 MG tablet Take 1 tablet (50 mg total) by mouth daily.   simvastatin (ZOCOR) 10 MG tablet TAKE 1 TABLET BY MOUTH EVERYDAY AT BEDTIME   TURMERIC PO Take by mouth.   No facility-administered encounter medications on file as of 04/13/2021.     ONCOLOGIC FAMILY HISTORY:  Family History  Problem Relation Age of Onset   Breast cancer Mother 3   COPD Mother    Diabetes Father    Cancer Father        Brain   Heart attack Father 80   Alcohol abuse Father    Depression Father    Uterine cancer Maternal Aunt    Hearing loss Maternal Grandmother    Hypertension Maternal Grandfather    Kidney disease Maternal Grandfather    Stroke Maternal Grandfather    High Cholesterol Brother    Hypertension Brother    Hyperlipidemia Brother    Autoimmune disease Daughter    Ankylosing spondylitis Daughter    Healthy Son    Depression Son       GENETIC COUNSELING/TESTING  Not at this time  SOCIAL HISTORY:  Social History   Socioeconomic History   Marital status: Divorced    Spouse name: Not on file   Number of children: 2   Years of education: Not on file   Highest education level: Associate degree: academic program  Occupational History   Occupation: Retired  Tobacco Use   Smoking status: Never   Smokeless tobacco: Never  Vaping Use   Vaping Use: Never used  Substance and Sexual Activity   Alcohol use: Yes    Alcohol/week: 22.0 standard drinks    Types: 8 Glasses of wine, 14 Cans of beer per week    Comment: daily   Drug use: No   Sexual activity: Not Currently  Other Topics Concern   Not on file  Social History Narrative   Live alone.     Patient is right-handed. She lives alone  in a 1 story house, 3-4 steps to enter.    Social Determinants of Health   Financial Resource Strain: Low Risk    Difficulty of Paying Living Expenses: Not hard at all  Food Insecurity: No Food Insecurity   Worried About Charity fundraiser in the Last Year: Never true   [place] in the Last Year: Never true  Transportation Needs: No Transportation Needs   Lack of Transportation (Medical): No   Lack of Transportation (Non-Medical): No  Physical Activity: Inactive   Days of Exercise per Week: 0 days   Minutes of Exercise per Session: 0 min  Stress: No Stress Concern Present   Feeling of Stress : Not at all  Social Connections: Socially Isolated   Frequency of Communication with Friends and Family: More than three times a week   Frequency of Social Gatherings with Friends and Family: Three times a week   Attends Religious Services: Never   Active Member of Clubs or Organizations: No   Attends Archivist Meetings: Never   Marital Status: Divorced  Human resources officer Violence: Not At Risk   Fear of Current or Ex-Partner: No   Emotionally Abused: No   Physically Abused: No   Sexually Abused: No      OBSERVATIONS/OBJECTIVE:  BP 107/67 (BP Location: Left Arm, Patient Position: Sitting)   Pulse 71   Temp 97.8 F (36.6 C) (Temporal)   Resp 18   Ht 5' 7.5" (1.715 m)   Wt 184 lb 11.2 oz (83.8 kg)   SpO2 98%   BMI 28.50 kg/m  GENERAL: Patient is a well appearing female in no acute distress HEENT:  Sclerae anicteric.  Oropharynx clear and moist. No ulcerations or evidence of oropharyngeal candidiasis. Neck is supple.  NODES:  No cervical, supraclavicular, or axillary lymphadenopathy palpated.  BREAST EXAM:  s/p right mastectomy no sign of local recurrence, left breast benign LUNGS:  Clear to auscultation bilaterally.  No wheezes or rhonchi. HEART:  Regular rate and rhythm. No murmur appreciated. ABDOMEN:  Soft, nontender.  Positive, normoactive bowel sounds. No organomegaly palpated. MSK:  No focal spinal tenderness to palpation. Full range of motion bilaterally in the upper extremities. EXTREMITIES:  No peripheral edema.   SKIN:  Clear with no obvious rashes or skin changes. No nail dyscrasia. NEURO:  Nonfocal. Well oriented.  Appropriate affect.   LABORATORY DATA:  None for this visit.  DIAGNOSTIC IMAGING:  None for this visit.      ASSESSMENT AND PLAN:  Ms.. George is a pleasant 75 y.o. 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  She presents to the Survivorship Clinic for our initial meeting and routine follow-up post-completion of treatment for breast cancer.    1. Stage IA right breast cancer:  Kelly George is continuing to recover from definitive treatment for breast cancer. She will follow-up with her medical oncologist, Dr. Lindi Adie in 6 months with history and physical exam per surveillance protocol.  She will continue her anti-estrogen therapy with Anastrozole. Thus far, she is tolerating the Anastrozole well, with minimal side  effects. She was instructed to make Dr. Lindi Adie or myself aware if she begins to experience any worsening side effects of the medication and I could see her back in clinic to help manage those side effects, as needed.   She is recommended to continue with annual left breast screening mammograms which will  be due again in December of 2022. Today, a comprehensive survivorship care plan and treatment summary was reviewed with the patient today detailing her breast cancer diagnosis, treatment course, potential late/long-term effects of treatment, appropriate follow-up care with recommendations for the future, and patient education resources.  A copy of this summary, along with a letter will be sent to the patient's primary care provider via mail/fax/In Basket message after today's visit.    2. Stress/fatigue/cognitive changes: These are likely all related.  We discussed adding ginseng, small amounts of activity, and I will send a message to our social worker so that we can get her in for counseling.  She completed the QOL CSV and I will send that down to [place] today.    3. Bone health:  Given Kelly George's age/history of breast cancer and her current treatment regimen including anti-estrogen therapy with Anastrozole, she is at risk for bone demineralization.  Her last DEXA scan was 04/05/2021, which showed mild osteopenia with a T score of -1.2 in the left forearm.  She was recommended to repeat this in 03/2023.  In the meantime, she was encouraged to increase her consumption of foods rich in calcium, as well as increase her weight-bearing activities.  She was given education on specific activities to promote bone health.  4. Cancer screening:  Due to Kelly George's history and her age, she should receive screening for skin cancers, colon cancer, and gynecologic cancers.  The information and recommendations are listed on the patient's comprehensive care plan/treatment summary and were reviewed in detail with the  patient.    5. Health maintenance and wellness promotion: Kelly George was encouraged to consume 5-7 servings of fruits and vegetables per day. We reviewed the "Nutrition Rainbow" handout, as well as the handout "Take Control of Your Health and Reduce Your Cancer Risk" from the [place].  She was also encouraged to engage in moderate to vigorous exercise for 30 minutes per day most days of the week. We discussed the [employer] fitness program, which is designed for cancer survivors to help them become more physically fit after cancer treatments.  She was instructed to limit her alcohol consumption and continue to abstain from tobacco use.     6. Support services/counseling: It is not uncommon for this period of the patient's cancer care trajectory to be one of many emotions and stressors.  We discussed how this can be increasingly difficult during the times of quarantine and social distancing due to the COVID-19 pandemic.   She was given information regarding our available services and encouraged to contact me with any questions or for help enrolling in any of our support group/programs.    Follow up instructions:    -Return to cancer center in 6 months for f/u with Dr. Lindi Adie  -Mammogram due in 09/2021 -Follow up with surgery 04/2022 -She is welcome to return back to the Survivorship Clinic at any time; no additional follow-up needed at this time.  -Consider referral back to survivorship as a long-term survivor for continued surveillance  The patient was provided an opportunity to ask questions and all were answered. The patient agreed with the plan and demonstrated an understanding of the instructions.   Total encounter time: 40 minutes* in SCP preparation, face to face visit time, chart review, order entry, and documentation of the encounter.    Wilber Bihari, NP 04/13/21 11:36 AM Medical Oncology and Hematology [facility] [place], [place]  [address] Tel. [phone]  Fax. [phone]  *Total Encounter Time as defined by the Centers for Medicare and Medicaid Services includes, in addition to the face-to-face time of a patient visit (documented in the note above) non-face-to-face time: obtaining and reviewing outside history, ordering and reviewing medications, tests or procedures, care coordination (communications with other health care professionals or caregivers) and documentation in the medical record.

## 2021-04-17 ENCOUNTER — Encounter: Payer: Self-pay | Admitting: Licensed Clinical Social Worker

## 2021-04-17 NOTE — Progress Notes (Signed)
[facility] Quality of Life Screening Clinical Social Work  Clinical Social Work was referred by survivorship quality of life screening protocol.  The patient scored a  38.2  on the Quality of Life/ Cancer Survivor (QOL-CS) scale which indicates moderate quality of life.   Clinical Social Worker contacted patient by phone to assess for needs. Based on screener and patient report, anxiety, depression, and stress are concerns. Discussed common themes in survivorship including psychological distress.  Patient reports that her mood concerns are not really related to cancer/ treatment but have occurred on and off throughout her adult life. She has previously had counseling and knows that being active and exercising are helpful, although she is having trouble being motivated right now. She is riding her horse sometimes and starting to walk in the mornings.  CSW provided information on groups and classes through [facility] and [employer]. Provided information on [employer] and encouraged patient to sign up. Patient is not really interested in counseling right now, but did agree to have CSW provide names of local therapists in case she wants to explore this in the future.   Follow up plan: Patient will sign up for [employer] and/or classes through [facility] or [employer] Follow-up QOL screen to be sent in 1 months.     Mary-Anne Polizzi E Williamson [place], LCSW

## 2021-05-02 DIAGNOSIS — C44311 Basal cell carcinoma of skin of nose: Secondary | ICD-10-CM | POA: Diagnosis not present

## 2021-05-05 ENCOUNTER — Other Ambulatory Visit: Payer: Self-pay | Admitting: Family Medicine

## 2021-05-05 DIAGNOSIS — E7800 Pure hypercholesterolemia, unspecified: Secondary | ICD-10-CM

## 2021-05-05 DIAGNOSIS — F419 Anxiety disorder, unspecified: Secondary | ICD-10-CM

## 2021-05-05 DIAGNOSIS — F339 Major depressive disorder, recurrent, unspecified: Secondary | ICD-10-CM

## 2021-06-12 ENCOUNTER — Ambulatory Visit (INDEPENDENT_AMBULATORY_CARE_PROVIDER_SITE_OTHER): Payer: Medicare Other | Admitting: Family Medicine

## 2021-06-12 ENCOUNTER — Other Ambulatory Visit: Payer: Self-pay

## 2021-06-12 ENCOUNTER — Encounter: Payer: Self-pay | Admitting: Family Medicine

## 2021-06-12 VITALS — BP 134/80 | HR 70 | Temp 98.0°F | Wt 177.5 lb

## 2021-06-12 DIAGNOSIS — C50911 Malignant neoplasm of unspecified site of right female breast: Secondary | ICD-10-CM | POA: Diagnosis not present

## 2021-06-12 DIAGNOSIS — E7800 Pure hypercholesterolemia, unspecified: Secondary | ICD-10-CM | POA: Diagnosis not present

## 2021-06-12 DIAGNOSIS — F339 Major depressive disorder, recurrent, unspecified: Secondary | ICD-10-CM

## 2021-06-12 NOTE — Progress Notes (Signed)
Established Patient Office Visit  Subjective:  Patient ID: Kelly George, female    DOB: Feb 27, 1946  Age: 75 y.o. MRN: [id number]  CC:  Chief Complaint  Patient presents with   Form Completion    HPI Kelly George presents for medical follow-up for form completion.  She would like to participate in a [employer] program through her local [employer].  This will be supervised by a trainer and apparently involves some flexibility along with strength training and aerobic component.  She has become very deconditioned since her cancer diagnosis last year.  She is excited to get back to some activities.  She does not have any cardiac history.  Does have some dyspnea with exertion but she thinks a lot of this is deconditioning.  Is not on any exertion related chest tightness.  She has history of hyperlipidemia treated with simvastatin.  She had recent lipids back in March and these were reviewed and stable.  Liver panel normal.  She has history of recurrent depression which is currently stable on sertraline 50 mg daily.  Does not need medicine refills at this time.  Her breast cancer is being treated with anastrozole.  She is tolerating this fairly well.  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  Past Medical History:  Diagnosis Date   Anxiety    Depression    Heart murmur    Hyperlipidemia    RBBB    Seasonal allergies     Past Surgical History:  Procedure Laterality Date   BREAST EXCISIONAL BIOPSY Left    COLONOSCOPY  07/07/01   MASTECTOMY W/ SENTINEL NODE BIOPSY Right 11/28/2020   Procedure: RIGHT MASTECTOMY WITH RIGHT AXILLARY SENTINEL LYMPH NODE BIOPSY;  Surgeon: Rolm Bookbinder, MD;  Location: [place];  Service: General;  Laterality: Right;  RNFA, PEC BLOCK   SKIN SURGERY     DERM - nasal    TOOTH EXTRACTION     TUBAL LIGATION      Family History  Problem Relation Age of Onset   Breast cancer Mother 44   COPD Mother     Diabetes Father    Cancer Father        Brain   Heart attack Father 45   Alcohol abuse Father    Depression Father    Uterine cancer Maternal Aunt    Hearing loss Maternal Grandmother    Hypertension Maternal Grandfather    Kidney disease Maternal Grandfather    Stroke Maternal Grandfather    High Cholesterol Brother    Hypertension Brother    Hyperlipidemia Brother    Autoimmune disease Daughter    Ankylosing spondylitis Daughter    Healthy Son    Depression Son     Social History   Socioeconomic History   Marital status: Divorced    Spouse name: Not on file   Number of children: 2   Years of education: Not on file   Highest education level: Associate degree: academic program  Occupational History   Occupation: Retired  Tobacco Use   Smoking status: Never   Smokeless tobacco: Never  Vaping Use   Vaping Use: Never used  Substance and Sexual Activity   Alcohol use: Yes    Alcohol/week: 22.0 standard drinks    Types: 8 Glasses of wine, 14 Cans of beer per week    Comment: daily   Drug use: No   Sexual activity: Not Currently  Other Topics Concern   Not on file  Social History Narrative   Live alone.     Patient is right-handed. She lives alone in a 1 story house, 3-4 steps to enter.    Social Determinants of Health   Financial Resource Strain: Low Risk    Difficulty of Paying Living Expenses: Not hard at all  Food Insecurity: No Food Insecurity   Worried About Charity fundraiser in the Last Year: Never true   [place] in the Last Year: Never true  Transportation Needs: No Transportation Needs   Lack of Transportation (Medical): No   Lack of Transportation (Non-Medical): No  Physical Activity: Inactive   Days of Exercise per Week: 0 days   Minutes of Exercise per Session: 0 min  Stress: No Stress Concern Present   Feeling of Stress : Not at all  Social Connections: Socially Isolated   Frequency of Communication with Friends and Family: More than  three times a week   Frequency of Social Gatherings with Friends and Family: Three times a week   Attends Religious Services: Never   Active Member of Clubs or Organizations: No   Attends Archivist Meetings: Never   Marital Status: Divorced  Human resources officer Violence: Not At Risk   Fear of Current or Ex-Partner: No   Emotionally Abused: No   Physically Abused: No   Sexually Abused: No    Outpatient Medications Prior to Visit  Medication Sig Dispense Refill   anastrozole (ARIMIDEX) 1 MG tablet Take 1 tablet (1 mg total) by mouth daily. 90 tablet 3   Ascorbic Acid (VITAMIN C) 1000 MG tablet Take 1,000 mg by mouth daily.     Cholecalciferol (VITAMIN D3) 2000 UNITS TABS Take 5,000 Units by mouth daily.      glucosamine-chondroitin 500-400 MG tablet Take 1 tablet by mouth 2 (two) times daily.      sertraline (ZOLOFT) 50 MG tablet TAKE 1 TABLET BY MOUTH EVERY DAY 90 tablet 3   simvastatin (ZOCOR) 10 MG tablet TAKE 1 TABLET BY MOUTH EVERYDAY AT BEDTIME 90 tablet 3   TURMERIC PO Take by mouth.     calcium-vitamin D (OSCAL WITH D) 500-200 MG-UNIT per tablet Take 1 tablet by mouth daily.     fish oil-omega-3 fatty acids 1000 MG capsule Take 1,200 mg by mouth daily.      naproxen sodium (ANAPROX) 220 MG tablet Take 220 mg by mouth daily. TAKE TWO TABLETS ONCE DAILY     No facility-administered medications prior to visit.    Allergies  Allergen Reactions   Penicillins Rash   Tetracyclines & Related Palpitations    ROS Review of Systems  Constitutional:  Negative for chills, fatigue and fever.  Eyes:  Negative for visual disturbance.  Respiratory:  Negative for cough, chest tightness, shortness of breath and wheezing.   Cardiovascular:  Negative for chest pain, palpitations and leg swelling.  Endocrine: Negative for polydipsia and polyuria.  Genitourinary:  Negative for dysuria.  Musculoskeletal:  Positive for arthralgias.  Neurological:  Negative for dizziness, seizures,  syncope, weakness, light-headedness and headaches.     Objective:    Physical Exam Constitutional:      Appearance: She is well-developed.  Eyes:     Pupils: Pupils are equal, round, and reactive to light.  Neck:     Thyroid: No thyromegaly.     Vascular: No JVD.  Cardiovascular:     Rate and Rhythm: Normal rate and regular rhythm.     Heart sounds:    No  gallop.  Pulmonary:     Effort: Pulmonary effort is normal. No respiratory distress.     Breath sounds: Normal breath sounds. No wheezing or rales.  Musculoskeletal:     Cervical back: Neck supple.     Right lower leg: No edema.     Left lower leg: No edema.  Neurological:     Mental Status: She is alert.    BP 134/80 (BP Location: Left Arm, Cuff Size: Normal)   Pulse 70   Temp 98 F (36.7 C)   Wt 177 lb 8 oz (80.5 kg)   SpO2 97%   BMI 27.39 kg/m  Wt Readings from Last 3 Encounters:  06/12/21 177 lb 8 oz (80.5 kg)  04/13/21 184 lb 11.2 oz (83.8 kg)  03/30/21 181 lb 12.8 oz (82.5 kg)     Health Maintenance Due  Topic Date Due   COVID-19 Vaccine (4 - Booster) 11/15/2020   INFLUENZA VACCINE  05/14/2021    There are no preventive care reminders to display for this patient.  Lab Results  Component Value Date   TSH 2.150 11/18/2019   Lab Results  Component Value Date   WBC 4.0 11/18/2019   HGB 14.6 11/18/2019   HCT 43.4 11/18/2019   MCV 95 11/18/2019   PLT 262 11/18/2019   Lab Results  Component Value Date   NA 140 12/15/2020   K 5.0 12/15/2020   CO2 29 12/15/2020   GLUCOSE 98 12/15/2020   BUN 19 12/15/2020   CREATININE 0.58 12/15/2020   BILITOT 0.7 12/15/2020   ALKPHOS 66 12/15/2020   AST 18 12/15/2020   ALT 19 12/15/2020   PROT 6.6 12/15/2020   ALBUMIN 4.2 12/15/2020   CALCIUM 9.7 12/15/2020   GFR 87.83 12/15/2020   Lab Results  Component Value Date   CHOL 172 12/15/2020   Lab Results  Component Value Date   HDL 79.30 12/15/2020   Lab Results  Component Value Date   LDLCALC 82  12/15/2020   Lab Results  Component Value Date   TRIG 53.0 12/15/2020   Lab Results  Component Value Date   CHOLHDL 2 12/15/2020   No results found for: HGBA1C    Assessment & Plan:   #1 history of breast cancer.  She had history of right breast cancer with current anastrozole therapy.  Followed closely by oncology.  Forms completed for her program through local [employer] which is geared at increasing her overall fitness  #2 hyperlipidemia treated with simvastatin.  Recent lipids at goal Continue current dose of simvastatin  #3 history of recurrent depression stable on sertraline -Continue sertraline 50 mg daily  Reminder for flu vaccine this fall.   No orders of the defined types were placed in this encounter.   Follow-up: No follow-ups on file.    Carolann Littler, MD

## 2021-07-09 ENCOUNTER — Ambulatory Visit: Payer: Medicare Other | Attending: General Surgery

## 2021-07-09 ENCOUNTER — Other Ambulatory Visit: Payer: Self-pay

## 2021-07-09 VITALS — Wt 173.3 lb

## 2021-07-09 DIAGNOSIS — Z483 Aftercare following surgery for neoplasm: Secondary | ICD-10-CM | POA: Insufficient documentation

## 2021-07-09 NOTE — Therapy (Signed)
[place], [place], [address] Phone: [phone]   Fax:  [phone]  Physical Therapy Treatment  Patient Details  Name: Kelly George MRN: [id number] Date of Birth: 10/04/1946 Referring Provider (PT): Dr. Donne Hazel   Encounter Date: 07/09/2021   PT End of Session - 07/09/21 0929     Visit Number 9   # unchanged due to screen only   PT Start Time 0922    PT Stop Time 0929    PT Time Calculation (min) 7 min    Activity Tolerance Patient tolerated treatment well    Behavior During Therapy [facility] for tasks assessed/performed             Past Medical History:  Diagnosis Date   Anxiety    Depression    Heart murmur    Hyperlipidemia    RBBB    Seasonal allergies     Past Surgical History:  Procedure Laterality Date   BREAST EXCISIONAL BIOPSY Left    COLONOSCOPY  07/07/01   MASTECTOMY W/ SENTINEL NODE BIOPSY Right 11/28/2020   Procedure: RIGHT MASTECTOMY WITH RIGHT AXILLARY SENTINEL LYMPH NODE BIOPSY;  Surgeon: Rolm Bookbinder, MD;  Location: [facility];  Service: General;  Laterality: Right;  RNFA, PEC BLOCK   SKIN SURGERY     DERM - nasal    TOOTH EXTRACTION     TUBAL LIGATION      Vitals:   07/09/21 0924  Weight: 173 lb 5 oz (78.6 kg)     Subjective Assessment - 07/09/21 0925     Subjective Pt returns for her 3 month L-Dex screen. "I've noticed some fullness at my lateral trunk area recently."    Pertinent History Pt was diagnosed on December 23,2021 with Right breast CA.  She had  Right mastectomy with SLNB on 11/28/2020 with Dr. Donne Hazel.  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  She had 2 LN removed, both negative. She is here for post surgical screening.  She lives on [address] and rides horses 4 days/week.                    L-DEX FLOWSHEETS - 07/09/21 0900       L-DEX  LYMPHEDEMA SCREENING   Measurement Type Unilateral    L-DEX MEASUREMENT EXTREMITY Upper Extremity    POSITION  Standing    DOMINANT SIDE Right    At Risk Side Right    BASELINE SCORE (UNILATERAL) 4.5    L-DEX SCORE (UNILATERAL) 5.7    VALUE CHANGE (UNILAT) 1.2                                  PT Short Term Goals - 02/12/21 0856       PT SHORT TERM GOAL #1   Title Right shoulder Abd will improve to 120 degrees for improved reaching abilty    Baseline 156    Period Weeks    Status Achieved               PT Long Term Goals - 02/12/21 5465       PT LONG TERM GOAL #1   Title Pt will restore shoulder Right ROM to pre-op levels for improved ability to perform home and recreational activities    Time 6    Period Weeks  Status Achieved      PT LONG TERM GOAL #2   Title Pt will be independent in HEP to improve shoulder ROM/strength    Time 4    Period Weeks      PT LONG TERM GOAL #3   Title Pt will be independent in self MLD to right axillary/chest swelling    Time 3    Period Weeks    Status Achieved      PT LONG TERM GOAL #4   Title pt will have a good understanding of skin care and risk reduction practices    Time 4    Period Weeks    Status Achieved      PT LONG TERM GOAL #5   Title Pt will be fit for compression sleeve/bra prn and will be independent in donning/doffing and wear    Time 6    Period Weeks    Status Achieved                   Plan - 07/09/21 0930     Clinical Impression Statement Pt returns for her 3 month L-Dex screen. Her change from baseline of 1.2 is WNLs so no further treatment is required at this time except to cont every 3 month L-Dex screens which pt is agreeable to. Advised her to be more proactive with her self MLD at focusing on her lateral trunk when she does her second round of MLD for the day. Also to resume wearing her chip pack at her lateral trunk in her compression bra as she reports she  stopped doing this. Pt verbalized understanding all and knows she can call our clinic if she needs further assistance.    PT Next Visit Plan Cont every 3 month L-Dex screens for up to 2 years from her SLNB (11/28/2022)    Consulted and Agree with Plan of Care Patient             Patient will benefit from skilled therapeutic intervention in order to improve the following deficits and impairments:     Visit Diagnosis: Aftercare following surgery for neoplasm     Problem List Patient Active Problem List   Diagnosis Date Noted   Breast cancer, right ([place]) 11/28/2020   Malignant neoplasm of upper-outer quadrant of right breast in female, estrogen receptor positive ([place]) 11/10/2020   GERD (gastroesophageal reflux disease) 04/03/2020   Osteoarthritis of multiple joints 04/03/2020   Viral warts 07/13/2019   Aortic atherosclerosis ([place]) 12/03/2018   Dyslipidemia 12/03/2018   Precordial chest pain 07/07/2015   Pain in joint of right foot 05/16/2014   Vitamin D deficiency 07/08/2013   Mitral valve prolapse syndrome, history of mild 01/05/2013   Seasonal allergies    Anxiety    Depression, recurrent ([place])    Hyperlipidemia     Kelly George, PTA 07/09/2021, 9:33 AM  [place], [place], [address] Phone: [phone]   Fax:  [phone]  Name: Kelly George MRN: [id number] Date of Birth: [birth year]

## 2021-07-25 DIAGNOSIS — N950 Postmenopausal bleeding: Secondary | ICD-10-CM | POA: Diagnosis not present

## 2021-07-31 ENCOUNTER — Other Ambulatory Visit: Payer: Self-pay | Admitting: Obstetrics and Gynecology

## 2021-07-31 DIAGNOSIS — R1900 Intra-abdominal and pelvic swelling, mass and lump, unspecified site: Secondary | ICD-10-CM

## 2021-08-06 DIAGNOSIS — Z23 Encounter for immunization: Secondary | ICD-10-CM | POA: Diagnosis not present

## 2021-08-16 ENCOUNTER — Other Ambulatory Visit: Payer: Self-pay

## 2021-08-16 ENCOUNTER — Ambulatory Visit
Admission: RE | Admit: 2021-08-16 | Discharge: 2021-08-16 | Disposition: A | Discharge status: Home / Self Care | Payer: Medicare Other | Source: Ambulatory Visit | Attending: Obstetrics and Gynecology | Admitting: Obstetrics and Gynecology

## 2021-08-16 DIAGNOSIS — D259 Leiomyoma of uterus, unspecified: Secondary | ICD-10-CM | POA: Diagnosis not present

## 2021-08-16 DIAGNOSIS — R1900 Intra-abdominal and pelvic swelling, mass and lump, unspecified site: Secondary | ICD-10-CM

## 2021-08-16 MED ORDER — GADOBENATE DIMEGLUMINE 529 MG/ML IV SOLN
15.0000 mL | Freq: Once | INTRAVENOUS | Status: AC | PRN
  Administered 2021-08-16: 15 mL via INTRAVENOUS

## 2021-08-17 ENCOUNTER — Other Ambulatory Visit: Payer: Medicare Other

## 2021-08-21 ENCOUNTER — Ambulatory Visit: Payer: Medicare Other

## 2021-09-04 ENCOUNTER — Ambulatory Visit: Payer: Medicare Other

## 2021-09-11 ENCOUNTER — Ambulatory Visit (INDEPENDENT_AMBULATORY_CARE_PROVIDER_SITE_OTHER): Payer: Medicare Other

## 2021-09-11 VITALS — BP 136/70 | HR 70 | Temp 98.2°F | Ht 67.5 in | Wt 176.7 lb

## 2021-09-11 DIAGNOSIS — Z0000 Encounter for general adult medical examination without abnormal findings: Secondary | ICD-10-CM

## 2021-09-11 DIAGNOSIS — H40033 Anatomical narrow angle, bilateral: Secondary | ICD-10-CM | POA: Diagnosis not present

## 2021-09-11 NOTE — Progress Notes (Signed)
This visit occurred during the SARS-CoV-2 public health emergency.  Safety protocols were in place, including screening questions prior to the visit, additional usage of staff PPE, and extensive cleaning of exam room while observing appropriate contact time as indicated for disinfecting solutions.  Subjective:   Teah Votaw is a 75 y.o. female who presents for Medicare Annual (Subsequent) preventive examination.  Review of Systems     Cardiac Risk Factors include: advanced age (>52men, >30 women);dyslipidemia     Objective:    Today's Vitals   09/11/21 1447  BP: 136/70  Pulse: 70  Temp: 98.2 F (36.8 C)  TempSrc: Oral  SpO2: 98%  Weight: 176 lb 11.2 oz (80.2 kg)  Height: 5' 7.5" (1.715 m)   Body mass index is 27.27 kg/m.  Advanced Directives 09/11/2021 04/13/2021 11/28/2020 11/21/2020 11/13/2020 08/16/2020 08/16/2019  Does Patient Have a Medical Advance Directive? Yes Yes Yes Yes Yes Yes No  Type of Paramedic of [place];Living will [place];Living will [place];Living will [place];Living will [place];Living will [place];Living will -  Does patient want to make changes to medical advance directive? - - No - Patient declined No - Patient declined No - Patient declined No - Patient declined -  Copy of [place] in Chart? No - copy requested No - copy requested No - copy requested - - No - copy requested -  Would patient like information on creating a medical advance directive? - - No - Patient declined - - - No - Patient declined    Current Medications (verified) Outpatient Encounter Medications as of 09/11/2021  Medication Sig   anastrozole (ARIMIDEX) 1 MG tablet Take 1 tablet (1 mg total) by mouth daily.   Ascorbic Acid (VITAMIN C) 1000 MG tablet Take 1,000 mg by mouth daily.   CALCIUM CARBONATE-VITAMIN D PO Take 2 tablets by mouth  daily.   Cholecalciferol (VITAMIN D3) 2000 UNITS TABS Take 5,000 Units by mouth daily.    glucosamine-chondroitin 500-400 MG tablet Take 1 tablet by mouth 2 (two) times daily.    naproxen sodium (ALEVE) 220 MG tablet Take 220 mg by mouth daily as needed.   Omega-3 Fatty Acids (EQL OMEGA 3 FISH OIL) 1000 MG CPDR Take 2 capsules by mouth daily.   sertraline (ZOLOFT) 50 MG tablet TAKE 1 TABLET BY MOUTH EVERY DAY   simvastatin (ZOCOR) 10 MG tablet TAKE 1 TABLET BY MOUTH EVERYDAY AT BEDTIME   TURMERIC PO Take by mouth.   No facility-administered encounter medications on file as of 09/11/2021.    Allergies (verified) Penicillins and Tetracyclines & related   History: Past Medical History:  Diagnosis Date   Anxiety    Depression    Heart murmur    Hyperlipidemia    RBBB    Seasonal allergies    Past Surgical History:  Procedure Laterality Date   BREAST EXCISIONAL BIOPSY Left    COLONOSCOPY  07/07/01   MASTECTOMY W/ SENTINEL NODE BIOPSY Right 11/28/2020   Procedure: RIGHT MASTECTOMY WITH RIGHT AXILLARY SENTINEL LYMPH NODE BIOPSY;  Surgeon: Rolm Bookbinder, MD;  Location: [facility];  Service: General;  Laterality: Right;  RNFA, PEC BLOCK   SKIN SURGERY     DERM - nasal    TOOTH EXTRACTION     TUBAL LIGATION     Family History  Problem Relation Age of Onset   Breast cancer Mother 79   COPD  Mother    Diabetes Father    Cancer Father        Brain   Heart attack Father 34   Alcohol abuse Father    Depression Father    Uterine cancer Maternal Aunt    Hearing loss Maternal Grandmother    Hypertension Maternal Grandfather    Kidney disease Maternal Grandfather    Stroke Maternal Grandfather    High Cholesterol Brother    Hypertension Brother    Hyperlipidemia Brother    Autoimmune disease Daughter    Ankylosing spondylitis Daughter    Healthy Son    Depression Son    Social History   Socioeconomic History   Marital status: Divorced    Spouse name: Not  on file   Number of children: 2   Years of education: Not on file   Highest education level: Associate degree: academic program  Occupational History   Occupation: Retired  Tobacco Use   Smoking status: Never   Smokeless tobacco: Never  Scientific laboratory technician Use: Never used  Substance and Sexual Activity   Alcohol use: Not Currently    Comment: occasionally   Drug use: No   Sexual activity: Not Currently  Other Topics Concern   Not on file  Social History Narrative   Live alone.     Patient is right-handed. She lives alone in a 1 story house, 3-4 steps to enter.    Social Determinants of Health   Financial Resource Strain: Low Risk    Difficulty of Paying Living Expenses: Not hard at all  Food Insecurity: No Food Insecurity   Worried About Charity fundraiser in the Last Year: Never true   [place] in the Last Year: Never true  Transportation Needs: No Transportation Needs   Lack of Transportation (Medical): No   Lack of Transportation (Non-Medical): No  Physical Activity: Inactive   Days of Exercise per Week: 0 days   Minutes of Exercise per Session: 0 min  Stress: No Stress Concern Present   Feeling of Stress : Only a little  Social Connections: Not on file    Tobacco Counseling Counseling given: Not Answered   Clinical Intake:  Pre-visit preparation completed: Yes  Pain : No/denies pain     Nutritional Status: BMI 25 -29 Overweight Nutritional Risks: None Diabetes: No  How often do you need to have someone help you when you read instructions, pamphlets, or other written materials from your doctor or pharmacy?: 1 - Never What is the last grade level you completed in school?: 14 yrs  Diabetic? no  Interpreter Needed?: No  Information entered by :: NAllen LPN   Activities of Daily Living In your present state of health, do you have any difficulty performing the following activities: 09/11/2021 11/28/2020  Hearing? N N  Vision? Y N  Difficulty  concentrating or making decisions? Y N  Comment some memory, not sure about some decisions -  Walking or climbing stairs? Y N  Comment due to knees and ankle -  Dressing or bathing? N N  Doing errands, shopping? N -  Preparing Food and eating ? N -  Using the Toilet? N -  In the past six months, have you accidently leaked urine? Y -  Do you have problems with loss of bowel control? N -  Managing your Medications? N -  Managing your Finances? N -  Some recent data might be hidden    Patient Care Team: Carolann Littler  W, MD as PCP - General (Family Medicine) Paula Compton, MD (Obstetrics and Gynecology) Pieter Partridge, DO as Consulting Physician (Neurology) Nicholas Lose, MD as Consulting Physician (Hematology and Oncology) Rolm Bookbinder, MD as Consulting Physician (General Surgery)  Indicate any recent [place] you may have received from other than [facility] providers in the past year (date may be approximate).     Assessment:   This is a routine wellness examination for [place].  Hearing/Vision screen Vision Screening - Comments:: Regular eye exams, Dr. Nicki Reaper  Dietary issues and exercise activities discussed: Current Exercise Habits: The patient does not participate in regular exercise at present   Goals Addressed             This Visit's Progress    Patient Stated       11/11/2020, no goals       Depression Screen PHQ 2/9 Scores 09/11/2021 08/16/2020 06/05/2020 11/18/2019 08/16/2019 07/16/2019 07/13/2019  PHQ - 2 Score 0 0 1 3 0 0 0  PHQ- 9 Score - 0 - 11 - - -    Fall Risk Fall Risk  09/11/2021 09/07/2021 08/16/2020 06/05/2020 11/18/2019  Falls in the past year? 1 1 0 0 0  Comment lost balance - - - -  Number falls in past yr: 0 0 0 0 -  Injury with Fall? 0 0 0 0 -  Risk Factor Category  - - - - -  Risk for fall due to : Medication side effect;Impaired balance/gait - No Fall Risks - -  Risk for fall due to: Comment - - - - -  Follow up Falls evaluation  completed;Education provided;Falls prevention discussed - Falls evaluation completed;Falls prevention discussed - -    FALL RISK PREVENTION PERTAINING TO THE HOME:  Any stairs in or around the home? Yes  If so, are there any without handrails? No  Home free of loose throw rugs in walkways, pet beds, electrical cords, etc? Yes  Adequate lighting in your home to reduce risk of falls? Yes   ASSISTIVE DEVICES UTILIZED TO PREVENT FALLS:  Life alert? No  Use of a cane, walker or w/c? No  Grab bars in the bathroom? Yes  Shower chair or bench in shower? No  Elevated toilet seat or a handicapped toilet? No   TIMED UP AND GO:  Was the test performed? No .    Gait steady and fast without use of assistive device  Cognitive Function: MMSE - Mini Mental State Exam 08/14/2018 05/20/2018 03/11/2018 06/25/2017  Orientation to time 5 5 5 5   Orientation to Place 5 5 5 5   Registration 3 3 3 3   Attention/ Calculation 5 5 5 5   Recall 3 2 3 1   Language- name 2 objects 2 2 2 2   Language- repeat 1 1 1 1   Language- follow 3 step command 3 3 3 3   Language- read & follow direction 1 1 1 1   Write a sentence 1 1 1 1   Copy design 1 0 1 1  Total score 30 28 30 28      6CIT Screen 09/11/2021 08/16/2019  What Year? 0 points 0 points  What month? 0 points 0 points  What time? 0 points 0 points  Count back from 20 0 points 0 points  Months in reverse 0 points 0 points  Repeat phrase 2 points 0 points  Total Score 2 0    Immunizations Immunization History  Administered Date(s) Administered   Fluad Quad(high Dose 65+) 07/13/2019  Influenza Whole 08/28/2012   Influenza, High Dose Seasonal PF 08/18/2015, 09/30/2016, 08/04/2017, 07/16/2018, 07/31/2020, 08/06/2021   Influenza,inj,Quad PF,6+ Mos 07/08/2013, 08/10/2014   Moderna Covid-19 Vaccine Bivalent Booster 33yrs & up 08/06/2021   Moderna Sars-Covid-2 Vaccination 01/13/2020, 02/02/2020   Pneumococcal Conjugate-13 01/05/2014   Pneumococcal  Polysaccharide-23 04/13/2010   Tdap 08/06/2011   Zoster Recombinat (Shingrix) 10/22/2020, 02/27/2021   Zoster, Live 01/13/2007    TDAP status: Due, Education has been provided regarding the importance of this vaccine. Advised may receive this vaccine at local pharmacy or Health Dept. Aware to provide a copy of the vaccination record if obtained from local pharmacy or Health Dept. Verbalized acceptance and understanding.  Flu Vaccine status: Up to date  Pneumococcal vaccine status: Up to date  Covid-19 vaccine status: Completed vaccines  Qualifies for Shingles Vaccine? Yes   Zostavax completed Yes   Shingrix Completed?: No.    Education has been provided regarding the importance of this vaccine. Patient has been advised to call insurance company to determine out of pocket expense if they have not yet received this vaccine. Advised may also receive vaccine at local pharmacy or Health Dept. Verbalized acceptance and understanding.  Screening Tests Health Maintenance  Topic Date Due   TETANUS/TDAP  08/05/2021   Hepatitis C Screening  02/19/2022 (Originally 06/02/1964)   Pneumonia Vaccine 88+ Years old  Completed   INFLUENZA VACCINE  Completed   DEXA SCAN  Completed   COVID-19 Vaccine  Completed   Zoster Vaccines- Shingrix  Completed   HPV VACCINES  Aged Out   COLONOSCOPY (Pts 45-1yrs Insurance coverage will need to be confirmed)  Discontinued    Health Maintenance  Health Maintenance Due  Topic Date Due   TETANUS/TDAP  08/05/2021    Colorectal cancer screening: No longer required.   Mammogram status: Completed 10/27/2020. Repeat every year  Bone Density status: Completed 04/05/2021. Results reflect: Bone density results: OSTEOPENIA. Repeat every 2 years.  Lung Cancer Screening: (Low Dose CT Chest recommended if Age 59-80 years, 30 pack-year currently smoking OR have quit w/in 15years.) does not qualify.   Lung Cancer Screening Referral: no  Additional  Screening:  Hepatitis C Screening: does qualify;   Vision Screening: Recommended annual ophthalmology exams for early detection of glaucoma and other disorders of the eye. Is the patient up to date with their annual eye exam?  Yes  Who is the provider or what is the name of the office in which the patient attends annual eye exams? Dr. Nicki Reaper If pt is not established with a provider, would they like to be referred to a provider to establish care? No .   Dental Screening: Recommended annual dental exams for proper oral hygiene  Community Resource Referral / Chronic Care Management: CRR required this visit?  No   CCM required this visit?  No      Plan:     I have personally reviewed and noted the following in the patient's chart:   Medical and social history Use of alcohol, tobacco or illicit drugs  Current medications and supplements including opioid prescriptions.  Functional ability and status Nutritional status Physical activity Advanced directives List of other physicians Hospitalizations, surgeries, and ER visits in previous 12 months Vitals Screenings to include cognitive, depression, and falls Referrals and appointments  In addition, I have reviewed and discussed with patient certain preventive protocols, quality metrics, and best practice recommendations. A written personalized care plan for preventive services as well as general preventive health recommendations were provided to  patient.     Kellie Simmering, LPN   25/05/7198   Nurse Notes: none

## 2021-09-11 NOTE — Patient Instructions (Signed)
Kelly George , Thank you for taking time to come for your Medicare Wellness Visit. I appreciate your ongoing commitment to your health goals. Please review the following plan we discussed and let me know if I can assist you in the future.   Screening recommendations/referrals: Colonoscopy: not required Mammogram: completed 10/27/2020 Bone Density: completed 04/05/2021 Recommended yearly ophthalmology/optometry visit for glaucoma screening and checkup Recommended yearly dental visit for hygiene and checkup  Vaccinations: Influenza vaccine: completed 08/06/2021 Pneumococcal vaccine: completed 01/05/2014 Tdap vaccine: due Shingles vaccine: completed   Covid-19: 08/06/2021, 08/15/2020, 02/02/2020, 01/13/2020  Advanced directives: Please bring a copy of your POA (Power of Attorney) and/or Living Will to your next appointment.   Conditions/risks identified: none  Next appointment: Follow up in one year for your annual wellness visit    Preventive Care 65 Years and Older, Female Preventive care refers to lifestyle choices and visits with your health care provider that can promote health and wellness. What does preventive care include? A yearly physical exam. This is also called an annual well check. Dental exams once or twice a year. Routine eye exams. Ask your health care provider how often you should have your eyes checked. Personal lifestyle choices, including: Daily care of your teeth and gums. Regular physical activity. Eating a healthy diet. Avoiding tobacco and drug use. Limiting alcohol use. Practicing safe sex. Taking low-dose aspirin every day. Taking vitamin and mineral supplements as recommended by your health care provider. What happens during an annual well check? The services and screenings done by your health care provider during your annual well check will depend on your age, overall health, lifestyle risk factors, and family history of disease. Counseling  Your health care  provider may ask you questions about your: Alcohol use. Tobacco use. Drug use. Emotional well-being. Home and relationship well-being. Sexual activity. Eating habits. History of falls. Memory and ability to understand (cognition). Work and work Statistician. Reproductive health. Screening  You may have the following tests or measurements: Height, weight, and BMI. Blood pressure. Lipid and cholesterol levels. These may be checked every 5 years, or more frequently if you are over 52 years old. Skin check. Lung cancer screening. You may have this screening every year starting at age 55 if you have a 30-pack-year history of smoking and currently smoke or have quit within the past 15 years. Fecal occult blood test (FOBT) of the stool. You may have this test every year starting at age 57. Flexible sigmoidoscopy or colonoscopy. You may have a sigmoidoscopy every 5 years or a colonoscopy every 10 years starting at age 68. Hepatitis C blood test. Hepatitis B blood test. Sexually transmitted disease (STD) testing. Diabetes screening. This is done by checking your blood sugar (glucose) after you have not eaten for a while (fasting). You may have this done every 1-3 years. Bone density scan. This is done to screen for osteoporosis. You may have this done starting at age 66. Mammogram. This may be done every 1-2 years. Talk to your health care provider about how often you should have regular mammograms. Talk with your health care provider about your test results, treatment options, and if necessary, the need for more tests. Vaccines  Your health care provider may recommend certain vaccines, such as: Influenza vaccine. This is recommended every year. Tetanus, diphtheria, and acellular pertussis (Tdap, Td) vaccine. You may need a Td booster every 10 years. Zoster vaccine. You may need this after age 86. Pneumococcal 13-valent conjugate (PCV13) vaccine. One dose is recommended  after age  61. Pneumococcal polysaccharide (PPSV23) vaccine. One dose is recommended after age 62. Talk to your health care provider about which screenings and vaccines you need and how often you need them. This information is not intended to replace advice given to you by your health care provider. Make sure you discuss any questions you have with your health care provider. Document Released: 10/27/2015 Document Revised: 06/19/2016 Document Reviewed: 08/01/2015 Elsevier Interactive Patient Education  2017 [place] Prevention in the Home Falls can cause injuries. They can happen to people of all ages. There are many things you can do to make your home safe and to help prevent falls. What can I do on the outside of my home? Regularly fix the edges of walkways and driveways and fix any cracks. Remove anything that might make you trip as you walk through a door, such as a raised step or threshold. Trim any bushes or trees on the path to your home. Use bright outdoor lighting. Clear any walking paths of anything that might make someone trip, such as rocks or tools. Regularly check to see if handrails are loose or broken. Make sure that both sides of any steps have handrails. Any raised decks and porches should have guardrails on the edges. Have any leaves, snow, or ice cleared regularly. Use sand or salt on walking paths during winter. Clean up any spills in your garage right away. This includes oil or grease spills. What can I do in the bathroom? Use night lights. Install grab bars by the toilet and in the tub and shower. Do not use towel bars as grab bars. Use non-skid mats or decals in the tub or shower. If you need to sit down in the shower, use a plastic, non-slip stool. Keep the floor dry. Clean up any water that spills on the floor as soon as it happens. Remove soap buildup in the tub or shower regularly. Attach bath mats securely with double-sided non-slip rug tape. Do not have throw  rugs and other things on the floor that can make you trip. What can I do in the bedroom? Use night lights. Make sure that you have a light by your bed that is easy to reach. Do not use any sheets or blankets that are too big for your bed. They should not hang down onto the floor. Have a firm chair that has side arms. You can use this for support while you get dressed. Do not have throw rugs and other things on the floor that can make you trip. What can I do in the kitchen? Clean up any spills right away. Avoid walking on wet floors. Keep items that you use a lot in easy-to-reach places. If you need to reach something above you, use a strong step stool that has a grab bar. Keep electrical cords out of the way. Do not use floor polish or wax that makes floors slippery. If you must use wax, use non-skid floor wax. Do not have throw rugs and other things on the floor that can make you trip. What can I do with my stairs? Do not leave any items on the stairs. Make sure that there are handrails on both sides of the stairs and use them. Fix handrails that are broken or loose. Make sure that handrails are as long as the stairways. Check any carpeting to make sure that it is firmly attached to the stairs. Fix any carpet that is loose or worn. Avoid having throw  rugs at the top or bottom of the stairs. If you do have throw rugs, attach them to the floor with carpet tape. Make sure that you have a light switch at the top of the stairs and the bottom of the stairs. If you do not have them, ask someone to add them for you. What else can I do to help prevent falls? Wear shoes that: Do not have high heels. Have rubber bottoms. Are comfortable and fit you well. Are closed at the toe. Do not wear sandals. If you use a stepladder: Make sure that it is fully opened. Do not climb a closed stepladder. Make sure that both sides of the stepladder are locked into place. Ask someone to hold it for you, if  possible. Clearly mark and make sure that you can see: Any grab bars or handrails. First and last steps. Where the edge of each step is. Use tools that help you move around (mobility aids) if they are needed. These include: Canes. Walkers. Scooters. Crutches. Turn on the lights when you go into a dark area. Replace any light bulbs as soon as they burn out. Set up your furniture so you have a clear path. Avoid moving your furniture around. If any of your floors are uneven, fix them. If there are any pets around you, be aware of where they are. Review your medicines with your doctor. Some medicines can make you feel dizzy. This can increase your chance of falling. Ask your doctor what other things that you can do to help prevent falls. This information is not intended to replace advice given to you by your health care provider. Make sure you discuss any questions you have with your health care provider. Document Released: 07/27/2009 Document Revised: 03/07/2016 Document Reviewed: 11/04/2014 Elsevier Interactive Patient Education  2017 [employer].

## 2021-09-24 ENCOUNTER — Other Ambulatory Visit: Payer: Self-pay | Admitting: Family Medicine

## 2021-09-24 DIAGNOSIS — Z1231 Encounter for screening mammogram for malignant neoplasm of breast: Secondary | ICD-10-CM

## 2021-10-19 ENCOUNTER — Inpatient Hospital Stay: Payer: Medicare Other | Admitting: Hematology and Oncology

## 2021-10-22 ENCOUNTER — Ambulatory Visit: Payer: Medicare Other

## 2021-10-30 ENCOUNTER — Ambulatory Visit: Payer: Medicare Other

## 2021-11-19 ENCOUNTER — Telehealth: Payer: Self-pay | Admitting: Hematology and Oncology

## 2021-11-19 NOTE — Telephone Encounter (Signed)
Sch per 2/6 inbasket, pt aware

## 2021-11-24 ENCOUNTER — Other Ambulatory Visit: Payer: Self-pay | Admitting: Hematology and Oncology

## 2021-12-14 NOTE — Progress Notes (Signed)
? ?Patient Care Team: ?Eulas Post, MD as PCP - General (Family Medicine) ?Paula Compton, MD (Obstetrics and Gynecology) ?Pieter Partridge, DO as Consulting Physician (Neurology) ?Nicholas Lose, MD as Consulting Physician (Hematology and Oncology) ?Rolm Bookbinder, MD as Consulting Physician (General Surgery) ? ?DIAGNOSIS:  ?  ICD-10-CM   ?1. Malignant neoplasm of upper-outer quadrant of right breast in female, estrogen receptor positive ([place])  C50.411   ? Z17.0   ?  ? ? ?SUMMARY OF ONCOLOGIC HISTORY: ?Oncology History  ?Malignant neoplasm of upper-outer quadrant of right breast in female, estrogen receptor positive ([place])  ?11/10/2020 Initial Diagnosis  ? Screening mammogram showed calcifications and a possible mass in the right breast. Diagnostic mammogram and US showed in the right breast, a 0.9cm cyst at the 8 o'clock position, a 1.0cm lesion at the 12 o'clock position, and no right axillary adenopathy. 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 ?  ?11/10/2020 Cancer Staging  ? Staging form: Breast, AJCC 8th Edition ?- Clinical stage from 11/10/2020: Stage IA (cT1b, cN0, cM0, G2, ER+, PR+, HER2-) - Signed by Gardenia Phlegm, NP on 04/10/2021 ?Histologic grading system: 3 grade system ? ?  ?11/28/2020 Surgery  ? 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 ?  ?11/28/2020 Cancer Staging  ? Staging form: Breast, AJCC 8th Edition ?- Pathologic stage from 11/28/2020: Stage IA (pT1c, pN0, cM0, G2, ER+, PR+, HER2-) - Signed by Gardenia Phlegm, NP on 04/10/2021 ?Stage prefix: Initial diagnosis ?Histologic grading system: 3 grade system ? ?  ?12/2020 -  Anti-estrogen oral therapy  ? Anastrozole daily ?  ? ? ?CHIEF COMPLIANT: Follow-up of right breast cancer  ? ?INTERVAL HISTORY:  Kelly George is a 76 y.o. with above-mentioned history of right breast cancer having undergone mastectomy, currently on antiestrogen therapy with anastrozole. She presents to the clinic today for follow-up.   ? ?ALLERGIES:  is allergic to penicillins and tetracyclines & related. ? ?MEDICATIONS:  ?Current Outpatient Medications  ?Medication Sig Dispense Refill  ? anastrozole (ARIMIDEX) 1 MG tablet TAKE 1 TABLET BY MOUTH EVERY DAY 90 tablet 3  ? Ascorbic Acid (VITAMIN C) 1000 MG tablet Take 1,000 mg by mouth daily.    ? CALCIUM CARBONATE-VITAMIN D PO Take 2 tablets by mouth daily.    ? Cholecalciferol (VITAMIN D3) 2000 UNITS TABS Take 5,000 Units by mouth daily.     ? glucosamine-chondroitin 500-400 MG tablet Take 1 tablet by mouth 2 (two) times daily.     ? naproxen sodium (ALEVE) 220 MG tablet Take 220 mg by mouth daily as needed.    ? Omega-3 Fatty Acids (EQL OMEGA 3 FISH OIL) 1000 MG CPDR Take 2 capsules by mouth daily.    ? sertraline (ZOLOFT) 50 MG tablet TAKE 1 TABLET BY MOUTH EVERY DAY 90 tablet 3  ? simvastatin (ZOCOR) 10 MG tablet TAKE 1 TABLET BY MOUTH EVERYDAY AT BEDTIME 90 tablet 3  ? TURMERIC PO Take by mouth.    ? ?No current facility-administered medications for this visit.  ? ? ?PHYSICAL EXAMINATION: ?ECOG PERFORMANCE STATUS: 1 - Symptomatic but completely ambulatory ? ?Vitals:  ? 12/17/21 1031  ?BP: (!) 156/78  ?Pulse: 64  ?Resp: 18  ?Temp: 97.7 ?F (36.5 ?C)  ?SpO2: 99%  ? ?Filed Weights  ? 12/17/21 1031  ?  Weight: 172 lb 1.6 oz (78.1 kg)  ? ? ?BREAST: No palpable masses or nodules in either right or left breasts. No palpable axillary supraclavicular or infraclavicular adenopathy no breast tenderness or nipple discharge. (exam performed in the presence of a chaperone) ? ?LABORATORY DATA:  ?I have reviewed the data as listed ?CMP Latest Ref Rng & Units 12/15/2020 11/18/2019 07/13/2019  ?Glucose 70 - 99 mg/dL 98 93 105(H)  ?BUN 6 - 23 mg/dL _0 ?Creatinine 0.40 - 1.20 mg/dL 0.58 0.66 0.73   ?Sodium 135 - 145 mEq/L 140 140 139  ?Potassium 3.5 - 5.1 mEq/L 5.0 4.2 4.3  ?Chloride 96 - 112 mEq/L 104 100 101  ?CO2 19 - 32 mEq/L _1 ?Calcium 8.4 - 10.5 mg/dL 9.7 10.0 9.9  ?Total Protein 6.0 - 8.3 g/dL 6.6 7.0 7.0  ?Total Bilirubin 0.2 - 1.2 mg/dL 0.7 0.7 0.7  ?Alkaline Phos 39 - 117 U/L 66 76 81  ?AST 0 - 37 U/L _2 ?ALT 0 - 35 U/L 19 24 33(H)  ? ? ?Lab Results  ?Component Value Date  ? WBC 4.0 11/18/2019  ? HGB 14.6 11/18/2019  ? HCT 43.4 11/18/2019  ? MCV 95 11/18/2019  ? PLT 262 11/18/2019  ? NEUTROABS 2.1 11/18/2019  ? ? ?ASSESSMENT & PLAN:  ?Malignant neoplasm of upper-outer quadrant of right breast in female, estrogen receptor positive ([place]) ?11/10/2020: Screening mammogram showed calcifications and a possible mass in the right breast. Diagnostic mammogram and US showed in the right breast, a 0.9cm cyst at the 8 o'clock position, a 1.0cm lesion at the 12 o'clock position, and no right axillary adenopathy. 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 ?  ?11/28/20: Rt mastectomy:Grade 2 ILC 1.1 cm and Grade 2 IDC 0.2 cm, 0/2 LN ?03/2021: Bone density: T score -1.2 (was -0.7:) Mild osteopenia ?  ?Plan: Adjuvant antiestrogen therapy with anastrozole 1 mg daily x5 to 7 years starting 12/12/2020 ?  ?Anastrozole Toxicities: Tolerating it extremely well without any problems or concerns.  Occasional joint stiffness could be related to aging and not related to the treatment. ? ?Breast Cancer Surveillance: ?1. Mammogram  Left breast: 01/03/22 ?2. Breast Exam: Benign ? ?Return to clinic in 1 year for follow-up ? ?No orders of the defined types were placed in this encounter. ? ?The patient has a good understanding of the overall plan. she agrees with it. she will call with any problems that may develop before the next visit here. ? ?Total time spent: 20 mins including face to face time and time spent for planning, charting and coordination of  care ? ?Rulon Eisenmenger, MD, MPH ?12/17/2021 ? ?I, Thana Ates, am acting as scribe for Dr. Nicholas Lose. ? ?I have reviewed the above documentation for accuracy and completeness, and I agree with the above. ? ? ? ? ? ? ?

## 2021-12-16 NOTE — Assessment & Plan Note (Signed)
11/10/2020:?Screening mammogram showed calcifications and a possible mass in the right breast. Diagnostic mammogram and US showed in the right breast, a 0.9cm cyst at the 8 o'clock position, a 1.0cm lesion at the 12 o'clock position, and no right axillary adenopathy. 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 ?? ?11/28/20: Rt mastectomy:Grade 2 ILC 1.1 cm and Grade 2 IDC 0.2 cm, 0/2 LN ?2019: Bone density: T score -0.7: Normal ?She has a bone density appointment in June 2022. ?? ?Plan: Adjuvant antiestrogen therapy?with anastrozole 1 mg daily x5 to 7 years starting 12/12/2020 ?? ?Anastrozole Toxicities: ? ?Breast Cancer Surveillance: ?1. Mammogram  Left breast: 01/03/22 ?2. Breast Exam: Benign ?

## 2021-12-17 ENCOUNTER — Other Ambulatory Visit: Payer: Self-pay

## 2021-12-17 ENCOUNTER — Inpatient Hospital Stay: Payer: Medicare Other | Attending: Hematology and Oncology | Admitting: Hematology and Oncology

## 2021-12-17 DIAGNOSIS — Z9011 Acquired absence of right breast and nipple: Secondary | ICD-10-CM | POA: Insufficient documentation

## 2021-12-17 DIAGNOSIS — Z79811 Long term (current) use of aromatase inhibitors: Secondary | ICD-10-CM | POA: Insufficient documentation

## 2021-12-17 DIAGNOSIS — M2560 Stiffness of unspecified joint, not elsewhere classified: Secondary | ICD-10-CM | POA: Diagnosis not present

## 2021-12-17 DIAGNOSIS — M8580 Other specified disorders of bone density and structure, unspecified site: Secondary | ICD-10-CM | POA: Diagnosis not present

## 2021-12-17 DIAGNOSIS — Z170 Estrogen receptor positive status [ER+]: Secondary | ICD-10-CM | POA: Diagnosis not present

## 2021-12-17 DIAGNOSIS — C50411 Malignant neoplasm of upper-outer quadrant of right female breast: Secondary | ICD-10-CM | POA: Diagnosis not present

## 2021-12-17 DIAGNOSIS — Z79899 Other long term (current) drug therapy: Secondary | ICD-10-CM | POA: Diagnosis not present

## 2021-12-19 ENCOUNTER — Ambulatory Visit
Admission: RE | Admit: 2021-12-19 | Discharge: 2021-12-19 | Disposition: A | Discharge status: Home / Self Care | Payer: Medicare Other | Source: Ambulatory Visit | Attending: Family Medicine | Admitting: Family Medicine

## 2021-12-19 DIAGNOSIS — Z1231 Encounter for screening mammogram for malignant neoplasm of breast: Secondary | ICD-10-CM

## 2022-02-26 DIAGNOSIS — Z872 Personal history of diseases of the skin and subcutaneous tissue: Secondary | ICD-10-CM | POA: Diagnosis not present

## 2022-02-26 DIAGNOSIS — L814 Other melanin hyperpigmentation: Secondary | ICD-10-CM | POA: Diagnosis not present

## 2022-02-26 DIAGNOSIS — Z09 Encounter for follow-up examination after completed treatment for conditions other than malignant neoplasm: Secondary | ICD-10-CM | POA: Diagnosis not present

## 2022-02-26 DIAGNOSIS — L570 Actinic keratosis: Secondary | ICD-10-CM | POA: Diagnosis not present

## 2022-02-26 DIAGNOSIS — L821 Other seborrheic keratosis: Secondary | ICD-10-CM | POA: Diagnosis not present

## 2022-02-26 DIAGNOSIS — D225 Melanocytic nevi of trunk: Secondary | ICD-10-CM | POA: Diagnosis not present

## 2022-02-26 DIAGNOSIS — Z08 Encounter for follow-up examination after completed treatment for malignant neoplasm: Secondary | ICD-10-CM | POA: Diagnosis not present

## 2022-02-26 DIAGNOSIS — Z85828 Personal history of other malignant neoplasm of skin: Secondary | ICD-10-CM | POA: Diagnosis not present

## 2022-03-14 ENCOUNTER — Emergency Department (HOSPITAL_BASED_OUTPATIENT_CLINIC_OR_DEPARTMENT_OTHER): Payer: Medicare Other

## 2022-03-14 ENCOUNTER — Emergency Department (HOSPITAL_BASED_OUTPATIENT_CLINIC_OR_DEPARTMENT_OTHER)
Admission: EM | Admit: 2022-03-14 | Discharge: 2022-03-14 | Disposition: A | Discharge status: Home / Self Care | Payer: Medicare Other | Attending: Emergency Medicine | Admitting: Emergency Medicine

## 2022-03-14 ENCOUNTER — Other Ambulatory Visit: Payer: Self-pay

## 2022-03-14 ENCOUNTER — Emergency Department (HOSPITAL_BASED_OUTPATIENT_CLINIC_OR_DEPARTMENT_OTHER): Payer: Medicare Other | Admitting: Radiology

## 2022-03-14 ENCOUNTER — Encounter (HOSPITAL_BASED_OUTPATIENT_CLINIC_OR_DEPARTMENT_OTHER): Payer: Self-pay | Admitting: Obstetrics and Gynecology

## 2022-03-14 DIAGNOSIS — S0990XA Unspecified injury of head, initial encounter: Secondary | ICD-10-CM | POA: Diagnosis not present

## 2022-03-14 DIAGNOSIS — M533 Sacrococcygeal disorders, not elsewhere classified: Secondary | ICD-10-CM | POA: Diagnosis not present

## 2022-03-14 DIAGNOSIS — M47812 Spondylosis without myelopathy or radiculopathy, cervical region: Secondary | ICD-10-CM | POA: Diagnosis not present

## 2022-03-14 DIAGNOSIS — M545 Low back pain, unspecified: Secondary | ICD-10-CM | POA: Diagnosis not present

## 2022-03-14 DIAGNOSIS — R519 Headache, unspecified: Secondary | ICD-10-CM | POA: Diagnosis not present

## 2022-03-14 DIAGNOSIS — R102 Pelvic and perineal pain: Secondary | ICD-10-CM | POA: Diagnosis not present

## 2022-03-14 MED ORDER — DICLOFENAC SODIUM 1 % EX GEL: 4.0000 g | Freq: Four times a day (QID) | CUTANEOUS | 0 refills | Status: DC

## 2022-03-14 MED ORDER — OXYCODONE HCL 5 MG PO TABS
5.0000 mg | ORAL_TABLET | Freq: Once | ORAL | Status: AC
  Administered 2022-03-14: 5 mg via ORAL
  Filled 2022-03-14: qty 1

## 2022-03-14 MED ORDER — ACETAMINOPHEN 500 MG PO TABS
1000.0000 mg | ORAL_TABLET | Freq: Once | ORAL | Status: AC
  Administered 2022-03-14: 1000 mg via ORAL
  Filled 2022-03-14: qty 2

## 2022-03-14 NOTE — ED Triage Notes (Signed)
Patient reports to the ER for an injury after attempting to dismount her horse. Patient fell and was wearing a helmet and the helmet cracked. Patient reports significant pain to her tailbone. Denies LOC, denies confusion. Reports some mild dizziness and nausea. Patient is alert and oriented. Denies chest pain and shortness of breath.

## 2022-03-14 NOTE — ED Notes (Addendum)
Patient transported to CT/Xray. 

## 2022-03-14 NOTE — ED Provider Notes (Signed)
[facility] EMERGENCY DEPT Provider Note   CSN: [id number] Arrival date & time: 03/14/22  1236     History  Chief Complaint  Patient presents with   Jethro Bastos Rhem Blossom is a 76 y.o. female.  76 yo F with a chief complaint of a fall.  The patient was dismounting her horse and was getting onto a mounting block and she lost her balance and fell onto her tailbone and then fell backwards and hit the back of her head.  She was wearing a helmet and the helmet was significantly cracked.  She was a bit dizzy and had a headache and had some nausea.  No vomiting no confusion.  Does have some tailbone pain after the fall.  Has been able to ambulate.  Denies extremity pain denies chest pain denies back pain denies abdominal pain.   Fall      Home Medications Prior to Admission medications   Medication Sig Start Date End Date Taking? Authorizing Provider  diclofenac Sodium (VOLTAREN) 1 % GEL Apply 4 g topically 4 (four) times daily. 03/14/22  Yes Deno Etienne, DO  anastrozole (ARIMIDEX) 1 MG tablet TAKE 1 TABLET BY MOUTH EVERY DAY 11/26/21   Nicholas Lose, MD  Ascorbic Acid (VITAMIN C) 1000 MG tablet Take 1,000 mg by mouth daily.    [provider]  CALCIUM CARBONATE-VITAMIN D PO Take 2 tablets by mouth daily.    [provider]  Cholecalciferol (VITAMIN D3) 2000 UNITS TABS Take 5,000 Units by mouth daily.     [provider]  glucosamine-chondroitin 500-400 MG tablet Take 1 tablet by mouth 2 (two) times daily.     [provider]  naproxen sodium (ALEVE) 220 MG tablet Take 220 mg by mouth daily as needed.    [provider]  Omega-3 Fatty Acids (EQL OMEGA 3 FISH OIL) 1000 MG CPDR Take 2 capsules by mouth daily.    [provider]  sertraline (ZOLOFT) 50 MG tablet TAKE 1 TABLET BY MOUTH EVERY DAY 05/07/21   Burchette, Alinda Sierras, MD  simvastatin (ZOCOR) 10 MG tablet TAKE 1 TABLET BY MOUTH EVERYDAY AT BEDTIME 05/07/21   Burchette,  Alinda Sierras, MD  TURMERIC PO Take by mouth.    [provider]      Allergies    Penicillins and Tetracyclines & related    Review of Systems   Review of Systems  Physical Exam Updated Vital Signs BP 126/74 (BP Location: Left Arm)   Pulse 62   Temp 98 F (36.7 C) (Oral)   Resp 17   Ht '5\' 8"'$  (1.727 m)   Wt 73.5 kg   SpO2 98%   BMI 24.63 kg/m  Physical Exam Vitals and nursing note reviewed.  Constitutional:      General: She is not in acute distress.    Appearance: She is well-developed. She is not diaphoretic.  HENT:     Head: Normocephalic and atraumatic.     Comments: No obvious midline C-spine tenderness able to raise her head 45 degrees without pain.  Eyes:     Pupils: Pupils are equal, round, and reactive to light.  Cardiovascular:     Rate and Rhythm: Normal rate and regular rhythm.     Heart sounds: No murmur heard.   No friction rub. No gallop.  Pulmonary:     Effort: Pulmonary effort is normal.     Breath sounds: No wheezing or rales.  Abdominal:     General: There  is no distension.     Palpations: Abdomen is soft.     Tenderness: There is no abdominal tenderness.  Musculoskeletal:        General: No tenderness.     Cervical back: Normal range of motion and neck supple.     Comments: No midline spinal tenderness step-offs or deformities.  No obvious tenderness about the tailbone.  Pelvis stable.  Palpated from head to toe without any noted bony tenderness.  Skin:    General: Skin is warm and dry.  Neurological:     Mental Status: She is alert and oriented to person, place, and time.  Psychiatric:        Behavior: Behavior normal.    ED Results / Procedures / Treatments   Labs (all labs ordered are listed, but only abnormal results are displayed) Labs Reviewed - No data to display  EKG None  Radiology DG Pelvis 1-2 Views  Result Date: 03/14/2022 CLINICAL DATA:  Tail bone pain after fall. EXAM: PELVIS - 1-2 VIEW COMPARISON:  None Available.  FINDINGS: There is no evidence of pelvic fracture or diastasis. No pelvic bone lesions are seen. IMPRESSION: Negative. Electronically Signed   By: Marijo Conception M.D.   On: 03/14/2022 13:54   DG Sacrum/Coccyx  Result Date: 03/14/2022 CLINICAL DATA:  Tail bone pain after fall. EXAM: SACRUM AND COCCYX - 2+ VIEW COMPARISON:  None Available. FINDINGS: There is no evidence of fracture or other focal bone lesions. IMPRESSION: Negative. Electronically Signed   By: Marijo Conception M.D.   On: 03/14/2022 13:53   CT Head Wo Contrast  Result Date: 03/14/2022 CLINICAL DATA:  Trauma, fall EXAM: CT HEAD WITHOUT CONTRAST TECHNIQUE: Contiguous axial images were obtained from the base of the skull through the vertex without intravenous contrast. RADIATION DOSE REDUCTION: This exam was performed according to the departmental dose-optimization program which includes automated exposure control, adjustment of the mA and/or kV according to patient size and/or use of iterative reconstruction technique. COMPARISON:  None Available. FINDINGS: Brain: No acute intracranial findings are seen. There are no signs of bleeding within the cranium. There is no focal edema or mass effect. There is decreased density in the periventricular white matter. Vascular: Unremarkable. Skull: No fracture is seen in the calvarium. Sinuses/Orbits: There is mild mucosal thickening in the ethmoid sinus. Other: None. IMPRESSION: No acute intracranial findings are seen in noncontrast CT brain. Small-vessel disease. Mild chronic sinusitis. Electronically Signed   By: Elmer Picker M.D.   On: 03/14/2022 13:50   CT Cervical Spine Wo Contrast  Result Date: 03/14/2022 CLINICAL DATA:  Provided history: Neck trauma. Additional history provided: Fall off of horse, head and neck tenderness. EXAM: CT CERVICAL SPINE WITHOUT CONTRAST TECHNIQUE: Multidetector CT imaging of the cervical spine was performed without intravenous contrast. Multiplanar CT image  reconstructions were also generated. RADIATION DOSE REDUCTION: This exam was performed according to the departmental dose-optimization program which includes automated exposure control, adjustment of the mA and/or kV according to patient size and/or use of iterative reconstruction technique. COMPARISON:  None. FINDINGS: Alignment: Straightening of the expected cervical lordosis. Mild C7-T1 grade 1 anterolisthesis. Skull base and vertebrae: The basion-dental and atlanto-dental intervals are maintained.No evidence of acute fracture to the cervical spine. Soft tissues and spinal canal: No prevertebral fluid or swelling. No visible canal hematoma. Disc levels: Cervical spondylosis with multilevel disc space narrowing, disc bulges, posterior disc osteophytes, endplate spurring, uncovertebral hypertrophy and facet arthrosis. 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 At the  remaining levels, disc space narrowing is greatest at C5-C6 and C6-C7 (advanced at these levels). No appreciable high-grade spinal canal stenosis. Multilevel bony neural foraminal narrowing. Multilevel ventral osteophytes. Upper chest: No consolidation within the imaged lung apices. No visible pneumothorax. Biapical pleuroparenchymal scarring. IMPRESSION: No evidence of acute fracture to the cervical spine. Nonspecific straightening of the expected cervical lordosis. Mild grade 1 anterolisthesis at C7-T1. Cervical spondylosis, as described. 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 Electronically Signed   By: Kellie Simmering D.O.   On: 03/14/2022 13:53    Procedures Procedures    Medications Ordered in ED Medications  acetaminophen (TYLENOL) tablet 1,000 mg (1,000 mg Oral Given 03/14/22 1423)  oxyCODONE (Oxy IR/ROXICODONE) immediate release tablet 5 mg (5 mg Oral Given 03/14/22 1423)    ED Course/ Medical Decision Making/ A&P                           Medical Decision Making Amount and/or Complexity of Data Reviewed Radiology:  ordered.  Risk OTC drugs. Prescription drug management.   76 yo F with a chief complaint of a fall off of a horse.  This was after it was stopped and she was trying to get onto a mounting block.  Lost her balance and fell backwards.  Landed on her bottom and then fell backwards and struck her head.  Complaining mostly of a headache and tailbone pain.  We will obtain a CT of the head and C-spine plain film of the coccyx.  Reassess.  Plain film of the coccyx and pelvis independently interpreted by me without fracture.  CT of the head and C-spine are negative for intracranial pathology.  No obvious fracture on C-spine is independently interpreted by me.  We will discharge the patient home.  PCP follow-up.  2:32 PM:  I have discussed the diagnosis/risks/treatment options with the patient and family.  Evaluation and diagnostic testing in the emergency department does not suggest an emergent condition requiring admission or immediate intervention beyond what has been performed at this time.  They will follow up with  PCP. We also discussed returning to the ED immediately if new or worsening sx occur. We discussed the sx which are most concerning (e.g., sudden worsening pain, fever, inability to tolerate by mouth) that necessitate immediate return. Medications administered to the patient during their visit and any new prescriptions provided to the patient are listed below.  Medications given during this visit Medications  acetaminophen (TYLENOL) tablet 1,000 mg (1,000 mg Oral Given 03/14/22 1423)  oxyCODONE (Oxy IR/ROXICODONE) immediate release tablet 5 mg (5 mg Oral Given 03/14/22 1423)     The patient appears reasonably screen and/or stabilized for discharge and I doubt any other medical condition or other [facility] requiring further screening, evaluation, or treatment in the ED at this time prior to discharge.          Final Clinical Impression(s) / ED Diagnoses Final diagnoses:  Fall from horse,  initial encounter    Rx / DC Orders ED Discharge Orders          Ordered    diclofenac Sodium (VOLTAREN) 1 % GEL  4 times daily        03/14/22 Finzel, Trashaun Streight, DO 03/14/22 1433

## 2022-03-14 NOTE — Discharge Instructions (Signed)
Use the gel as prescribed.  Take tylenol 1000mg(2 extra strength) four times a day.      

## 2022-03-26 ENCOUNTER — Ambulatory Visit (INDEPENDENT_AMBULATORY_CARE_PROVIDER_SITE_OTHER): Payer: Medicare Other | Admitting: Family Medicine

## 2022-03-26 ENCOUNTER — Encounter: Payer: Self-pay | Admitting: Family Medicine

## 2022-03-26 ENCOUNTER — Telehealth: Payer: Self-pay | Admitting: *Deleted

## 2022-03-26 VITALS — BP 120/70 | HR 70 | Temp 98.4°F | Ht 68.0 in | Wt 163.8 lb

## 2022-03-26 DIAGNOSIS — S300XXD Contusion of lower back and pelvis, subsequent encounter: Secondary | ICD-10-CM

## 2022-03-26 NOTE — Progress Notes (Signed)
Established Patient Office Visit  Subjective   Patient ID: Kelly George, female    DOB: April 06, 1946  Age: 76 y.o. MRN: [id number]  Chief Complaint  Patient presents with   Fall   Back Pain   Flank Pain    Patient complains of flank pain, x2 weeks, Tried Tylenol and Ibuprofen with little relief    HPI   Kelly George is seen following injuries which occurred June 1 when she was dismounting her horse.  Apparently the horse moved a bit and she fell backwards.  When she fell backwards she hit her head posteriorly.  This occurred after she had landed on her sacrum.  Her helmet was cracked in the process.  There was no loss of consciousness.  She did have some lightheadedness and nausea without vomiting and headache immediately afterwards but this had resolved before her ER visit was over.  She had pelvic films and films of the coccyx as well as CT cervical spine and CT head with no acute fracture or other acute abnormality.  Her main issue now is pain around the coccyx region with sitting and changing positions and bending over.  She has been alternating Tylenol and ibuprofen.  No major problems with ambulation.  No headaches.  No dizziness.  No hip pain.  Past Medical History:  Diagnosis Date   Anxiety    Depression    Heart murmur    Hyperlipidemia    RBBB    Seasonal allergies    Past Surgical History:  Procedure Laterality Date   BREAST BIOPSY Right 10/2020   x2   BREAST EXCISIONAL BIOPSY Left    COLONOSCOPY  07/07/2001   MASTECTOMY Right 11/2020   MASTECTOMY W/ SENTINEL NODE BIOPSY Right 11/28/2020   Procedure: RIGHT MASTECTOMY WITH RIGHT AXILLARY SENTINEL LYMPH NODE BIOPSY;  Surgeon: Rolm Bookbinder, MD;  Location: [facility];  Service: General;  Laterality: Right;  RNFA, PEC BLOCK   SKIN SURGERY     DERM - nasal    TOOTH EXTRACTION     TUBAL LIGATION      reports that she has never smoked. She has been exposed to tobacco smoke. She has never used  smokeless tobacco. She reports current alcohol use. She reports that she does not use drugs. family history includes Alcohol abuse in her father; Ankylosing spondylitis in her daughter; Autoimmune disease in her daughter; Breast cancer (age of onset: 62) in her mother; COPD in her mother; Cancer in her father; Depression in her father and son; Diabetes in her father; Healthy in her son; Hearing loss in her maternal grandmother; Heart attack (age of onset: 19) in her father; High Cholesterol in her brother; Hyperlipidemia in her brother; Hypertension in her brother and maternal grandfather; Kidney disease in her maternal grandfather; Stroke in her maternal grandfather; Uterine cancer in her maternal aunt. Allergies  Allergen Reactions   Penicillins Rash   Tetracyclines & Related Palpitations    Review of Systems  Constitutional:  Negative for chills and fever.  Respiratory:  Negative for cough.   Gastrointestinal:  Negative for nausea and vomiting.  Genitourinary:  Negative for hematuria.  Musculoskeletal:  Negative for neck pain.  Neurological:  Negative for dizziness, focal weakness and headaches.      Objective:     BP 120/70 (BP Location: Left Arm, Patient Position: Sitting, Cuff Size: Normal)   Pulse 70   Temp 98.4 F (36.9 C) (Oral)   Ht '5\' 8"'$  (1.727 m)   Wt 163  lb 12.8 oz (74.3 kg)   SpO2 98%   BMI 24.91 kg/m    Physical Exam Vitals reviewed.  Constitutional:      Appearance: Normal appearance.  HENT:     Head: Normocephalic and atraumatic.  Cardiovascular:     Rate and Rhythm: Normal rate and regular rhythm.  Pulmonary:     Effort: Pulmonary effort is normal.     Breath sounds: Normal breath sounds.  Musculoskeletal:     Comments: No lumbar or sacral tenderness.  Straight leg raise negative bilaterally.  Full strength lower extremities.  Excellent range of motion both hips.  Neurological:     General: No focal deficit present.     Mental Status: She is alert.      Motor: No weakness.      No results found for any visits on 03/26/22.    The 10-year ASCVD risk score (Arnett DK, et al., 2019) is: 18.2%    Assessment & Plan:   Status post recent fall now with coccyx pain predominantly.  Imaging did not reveal any fracture.  -She wishes to avoid pain medicines as she has had negative symptoms with those in the past- especially dizziness. -Avoid regular use of ibuprofen -Continue Tylenol. -Consider trial of topical Voltaren gel 3-4 times daily -Increased ambulation and activities as tolerated  No follow-ups on file.    Carolann Littler, MD

## 2022-03-26 NOTE — Telephone Encounter (Signed)
Received VM from pt.  RN attempt x 1 to return call to pt.  No answer, LVM for pt to return call to the office.

## 2022-03-26 NOTE — Patient Instructions (Addendum)
Try the topical Voltaren gel 3-4 times daily  Continue the Tylenol as needed and avoid Ibuprofen as much as possible.

## 2022-04-17 ENCOUNTER — Ambulatory Visit: Payer: Medicare Other | Admitting: Family Medicine

## 2022-04-17 ENCOUNTER — Telehealth: Payer: Self-pay | Admitting: *Deleted

## 2022-04-17 NOTE — Telephone Encounter (Signed)
Received call from pt with complaint of hemorrhoids and requesting advice from MD. Per MD pt needing to use OTC Preparation H cream and f/u with PCP if symptoms do not improved.  Pt verbalized understanding.

## 2022-05-24 ENCOUNTER — Other Ambulatory Visit: Payer: Self-pay | Admitting: Family Medicine

## 2022-05-24 DIAGNOSIS — F419 Anxiety disorder, unspecified: Secondary | ICD-10-CM

## 2022-05-24 DIAGNOSIS — F339 Major depressive disorder, recurrent, unspecified: Secondary | ICD-10-CM

## 2022-06-21 ENCOUNTER — Other Ambulatory Visit: Payer: Self-pay | Admitting: Family Medicine

## 2022-06-21 DIAGNOSIS — E7800 Pure hypercholesterolemia, unspecified: Secondary | ICD-10-CM

## 2022-07-03 ENCOUNTER — Telehealth: Payer: Self-pay

## 2022-07-03 DIAGNOSIS — Z170 Estrogen receptor positive status [ER+]: Secondary | ICD-10-CM

## 2022-07-03 NOTE — Telephone Encounter (Signed)
Pt called and LVM voicing interest in "research a friend told me about" Attempted to return pt's call. LVM for call back.

## 2022-07-03 NOTE — Telephone Encounter (Signed)
Pt returned call and asks if she is a candidate for [employer]. Educated pt on [employer] and its testing. She is interested. Per MD, orders entered and all supporting documents faxed to [employer] with confirmation.

## 2022-07-16 ENCOUNTER — Other Ambulatory Visit: Payer: Self-pay | Admitting: Family Medicine

## 2022-07-16 DIAGNOSIS — E7800 Pure hypercholesterolemia, unspecified: Secondary | ICD-10-CM

## 2022-07-24 ENCOUNTER — Encounter: Payer: Self-pay | Admitting: Hematology and Oncology

## 2022-07-25 ENCOUNTER — Other Ambulatory Visit: Payer: Self-pay | Admitting: Family Medicine

## 2022-07-25 DIAGNOSIS — F419 Anxiety disorder, unspecified: Secondary | ICD-10-CM

## 2022-07-25 DIAGNOSIS — F339 Major depressive disorder, recurrent, unspecified: Secondary | ICD-10-CM

## 2022-07-26 ENCOUNTER — Other Ambulatory Visit: Payer: Self-pay | Admitting: Family Medicine

## 2022-07-26 DIAGNOSIS — E7800 Pure hypercholesterolemia, unspecified: Secondary | ICD-10-CM

## 2022-07-30 ENCOUNTER — Ambulatory Visit (INDEPENDENT_AMBULATORY_CARE_PROVIDER_SITE_OTHER): Payer: Medicare Other | Admitting: Family Medicine

## 2022-07-30 VITALS — BP 134/72 | HR 63 | Temp 97.8°F | Ht 68.0 in | Wt 163.5 lb

## 2022-07-30 DIAGNOSIS — E7800 Pure hypercholesterolemia, unspecified: Secondary | ICD-10-CM | POA: Diagnosis not present

## 2022-07-30 DIAGNOSIS — R42 Dizziness and giddiness: Secondary | ICD-10-CM

## 2022-07-30 DIAGNOSIS — Z23 Encounter for immunization: Secondary | ICD-10-CM

## 2022-07-30 DIAGNOSIS — F339 Major depressive disorder, recurrent, unspecified: Secondary | ICD-10-CM | POA: Diagnosis not present

## 2022-07-30 LAB — BASIC METABOLIC PANEL
BUN: 18 mg/dL (ref 6–23)
CO2: 32 mEq/L (ref 19–32)
Calcium: 10.3 mg/dL (ref 8.4–10.5)
Chloride: 102 mEq/L (ref 96–112)
Creatinine, Ser: 0.56 mg/dL (ref 0.40–1.20)
GFR: 87.58 mL/min (ref >60.00)
Glucose, Bld: 97 mg/dL (ref 70–99)
Potassium: 4.5 mEq/L (ref 3.5–5.1)
Sodium: 139 mEq/L (ref 135–145)

## 2022-07-30 LAB — LIPID PANEL
Cholesterol: 172 mg/dL (ref 0–200)
HDL: 81.1 mg/dL (ref >39.00)
LDL Cholesterol: 80 mg/dL (ref 0–99)
NonHDL: 90.85
Total CHOL/HDL Ratio: 2
Triglycerides: 52 mg/dL (ref 0.0–149.0)
VLDL: 10.4 mg/dL (ref 0.0–40.0)

## 2022-07-30 LAB — HEPATIC FUNCTION PANEL
ALT: 30 U/L (ref 0–35)
AST: 28 U/L (ref 0–37)
Albumin: 4.7 g/dL (ref 3.5–5.2)
Alkaline Phosphatase: 66 U/L (ref 39–117)
Bilirubin, Direct: 0.2 mg/dL (ref 0.0–0.3)
Total Bilirubin: 0.8 mg/dL (ref 0.2–1.2)
Total Protein: 7.6 g/dL (ref 6.0–8.3)

## 2022-07-30 MED ORDER — SIMVASTATIN 10 MG PO TABS: ORAL_TABLET | ORAL | 3 refills | Status: DC

## 2022-07-30 NOTE — Progress Notes (Signed)
Established Patient Office Visit  Subjective   Patient ID: Kelly George, female    DOB: [birth year]  Age: 76 y.o. MRN: [id number]  Chief Complaint  Patient presents with   Medication Refill   Health Maintenance    HPI   Kelly George is seen for medical follow-up.  She has history of osteoarthritis, recurrent depression, hyperlipidemia, breast cancer.  Generally doing well.  She has occasional fleeting dizziness when she turns suddenly and this sounds more like a vertigo than any, orthostatic symptoms.  Symptoms are usually very transient lasting seconds.  Sometimes has associated nausea.  She is not sure if there is a consistent directional component.  She has been doing [employer] and with exercise there such as bending she notices triggers her dizziness occasionally occasional symptoms at night of "skipped beat ".  No chest pain.  Previous echo showed mild mitral valve regurgitation.  No dyspnea with activity.  History of recurrent depression.  Currently stable on sertraline 50 mg daily.  She takes low-dose simvastatin for hyperlipidemia and is overdue for labs.  No significant myalgias other than recent bilateral shoulder pains.  She does remain on anastrozole for her breast cancer.  Followed closely by oncology.  Still needs flu shot  Past Medical History:  Diagnosis Date   Anxiety    Depression    Heart murmur    Hyperlipidemia    RBBB    Seasonal allergies    Past Surgical History:  Procedure Laterality Date   BREAST BIOPSY Right 10/2020   x2   BREAST EXCISIONAL BIOPSY Left    COLONOSCOPY  07/07/2001   MASTECTOMY Right 11/2020   MASTECTOMY W/ SENTINEL NODE BIOPSY Right 11/28/2020   Procedure: RIGHT MASTECTOMY WITH RIGHT AXILLARY SENTINEL LYMPH NODE BIOPSY;  Surgeon: Rolm Bookbinder, MD;  Location: [facility];  Service: General;  Laterality: Right;  RNFA, PEC BLOCK   SKIN SURGERY     DERM - nasal    TOOTH EXTRACTION     TUBAL LIGATION       reports that she has never smoked. She has been exposed to tobacco smoke. She has never used smokeless tobacco. She reports current alcohol use. She reports that she does not use drugs. family history includes Alcohol abuse in her father; Ankylosing spondylitis in her daughter; Autoimmune disease in her daughter; Breast cancer (age of onset: 1) in her mother; COPD in her mother; Cancer in her father; Depression in her father and son; Diabetes in her father; Healthy in her son; Hearing loss in her maternal grandmother; Heart attack (age of onset: 64) in her father; High Cholesterol in her brother; Hyperlipidemia in her brother; Hypertension in her brother and maternal grandfather; Kidney disease in her maternal grandfather; Stroke in her maternal grandfather; Uterine cancer in her maternal aunt. Allergies  Allergen Reactions   Penicillins Rash   Tetracyclines & Related Palpitations    Review of Systems  Constitutional:  Negative for malaise/fatigue and weight loss.  Eyes:  Negative for blurred vision.  Respiratory:  Negative for shortness of breath.   Cardiovascular:  Negative for chest pain.  Neurological:  Positive for dizziness. Negative for speech change, focal weakness, seizures, loss of consciousness, weakness and headaches.       See HPI  Psychiatric/Behavioral:  Negative for depression and suicidal ideas.       Objective:     BP 134/72 (BP Location: Left Arm, Patient Position: Sitting, Cuff Size: Normal)   Pulse 63   Temp  97.8 F (36.6 C) (Oral)   Ht '5\' 8"'$  (1.727 m)   Wt 163 lb 8 oz (74.2 kg)   SpO2 99%   BMI 24.86 kg/m  BP Readings from Last 3 Encounters:  07/30/22 134/72  03/26/22 120/70  03/14/22 126/74   Wt Readings from Last 3 Encounters:  07/30/22 163 lb 8 oz (74.2 kg)  03/26/22 163 lb 12.8 oz (74.3 kg)  03/14/22 162 lb (73.5 kg)      Physical Exam Vitals reviewed.  Constitutional:      Appearance: She is well-developed.  Eyes:     Pupils: Pupils are  equal, round, and reactive to light.  Neck:     Thyroid: No thyromegaly.     Vascular: No JVD.  Cardiovascular:     Rate and Rhythm: Normal rate and regular rhythm.     Heart sounds:     No gallop.  Pulmonary:     Effort: Pulmonary effort is normal. No respiratory distress.     Breath sounds: Normal breath sounds. No wheezing or rales.  Musculoskeletal:     Cervical back: Neck supple.  Neurological:     Mental Status: She is alert.      No results found for any visits on 07/30/22.    The 10-year ASCVD risk score (Arnett DK, et al., 2019) is: 24.8%    Assessment & Plan:   #1 hyperlipidemia.  Patient on low-dose simvastatin.  Recheck lipids today along with hepatic panel.  Refill simvastatin for 1 year  #2 history of recurrent depression.  Currently stable on sertraline 50 mg daily.  Continue current dose.  #3 transient dizziness.  This sounds like transient vertigo symptoms.  The usually very brief.  Reviewed signs and symptoms of more worrisome vertigo  Flu vaccine given today   No follow-ups on file.    Carolann Littler, MD

## 2022-08-05 DIAGNOSIS — Z23 Encounter for immunization: Secondary | ICD-10-CM | POA: Diagnosis not present

## 2022-08-09 ENCOUNTER — Encounter: Payer: Self-pay | Admitting: Internal Medicine

## 2022-08-09 LAB — SIGNATERA ONLY (NATERA MANAGED)
SIGNATERA MTM READOUT: 0 MTM/ml
SIGNATERA TEST RESULT: NEGATIVE

## 2022-08-12 ENCOUNTER — Telehealth: Payer: Self-pay | Admitting: *Deleted

## 2022-08-12 NOTE — Telephone Encounter (Signed)
Per MD request, RN placed call to pt regarding negative (Not Detected) recent [employer] testing.  Pt appreciative of call and verbalized understanding.  

## 2022-08-13 ENCOUNTER — Encounter: Payer: Self-pay | Admitting: Hematology and Oncology

## 2022-08-20 ENCOUNTER — Encounter (HOSPITAL_COMMUNITY): Payer: Self-pay

## 2022-09-11 DIAGNOSIS — H2513 Age-related nuclear cataract, bilateral: Secondary | ICD-10-CM | POA: Diagnosis not present

## 2022-09-24 ENCOUNTER — Ambulatory Visit: Payer: Medicare Other

## 2022-09-27 ENCOUNTER — Ambulatory Visit (INDEPENDENT_AMBULATORY_CARE_PROVIDER_SITE_OTHER): Payer: Medicare Other

## 2022-09-27 VITALS — BP 120/60 | HR 58 | Temp 98.1°F | Ht 69.6 in | Wt 166.3 lb

## 2022-09-27 DIAGNOSIS — Z0000 Encounter for general adult medical examination without abnormal findings: Secondary | ICD-10-CM

## 2022-09-27 NOTE — Patient Instructions (Addendum)
Kelly George , Thank you for taking time to come for your Medicare Wellness Visit. I appreciate your ongoing commitment to your health goals. Please review the following plan we discussed and let me know if I can assist you in the future.   These are the goals we discussed:  Goals       DIET - INCREASE WATER INTAKE      Try to drink 6-8 glasses of water daily      Exercise 150 min/wk Moderate Activity      No currnt goals. (pt-stated)      Stay healthy.      Patient Stated      11/11/2020, no goals        This is a list of the screening recommended for you and due dates:  Health Maintenance  Topic Date Due   DTaP/Tdap/Td vaccine (2 - Td or Tdap) 08/05/2021   COVID-19 Vaccine (5 - 2023-24 season) 10/13/2022*   Hepatitis C Screening: USPSTF Recommendation to screen - Ages 18-79 yo.  09/28/2023*   Medicare Annual Wellness Visit  09/28/2023   Pneumonia Vaccine  Completed   Flu Shot  Completed   DEXA scan (bone density measurement)  Completed   Zoster (Shingles) Vaccine  Completed   HPV Vaccine  Aged Out   Colon Cancer Screening  Discontinued  *Topic was postponed. The date shown is not the original due date.    Advanced directives: Please bring a copy of your health care power of attorney and living will to the office to be added to your chart at your convenience.   Conditions/risks identified: None  Next appointment: Follow up in one year for your annual wellness visit     Preventive Care 65 Years and Older, Female Preventive care refers to lifestyle choices and visits with your health care provider that can promote health and wellness. What does preventive care include? A yearly physical exam. This is also called an annual well check. Dental exams once or twice a year. Routine eye exams. Ask your health care provider how often you should have your eyes checked. Personal lifestyle choices, including: Daily care of your teeth and gums. Regular physical activity. Eating a  healthy diet. Avoiding tobacco and drug use. Limiting alcohol use. Practicing safe sex. Taking low-dose aspirin every day. Taking vitamin and mineral supplements as recommended by your health care provider. What happens during an annual well check? The services and screenings done by your health care provider during your annual well check will depend on your age, overall health, lifestyle risk factors, and family history of disease. Counseling  Your health care provider may ask you questions about your: Alcohol use. Tobacco use. Drug use. Emotional well-being. Home and relationship well-being. Sexual activity. Eating habits. History of falls. Memory and ability to understand (cognition). Work and work Statistician. Reproductive health. Screening  You may have the following tests or measurements: Height, weight, and BMI. Blood pressure. Lipid and cholesterol levels. These may be checked every 5 years, or more frequently if you are over 56 years old. Skin check. Lung cancer screening. You may have this screening every year starting at age 48 if you have a 30-pack-year history of smoking and currently smoke or have quit within the past 15 years. Fecal occult blood test (FOBT) of the stool. You may have this test every year starting at age 84. Flexible sigmoidoscopy or colonoscopy. You may have a sigmoidoscopy every 5 years or a colonoscopy every 10 years starting at age  50. Hepatitis C blood test. Hepatitis B blood test. Sexually transmitted disease (STD) testing. Diabetes screening. This is done by checking your blood sugar (glucose) after you have not eaten for a while (fasting). You may have this done every 1-3 years. Bone density scan. This is done to screen for osteoporosis. You may have this done starting at age 49. Mammogram. This may be done every 1-2 years. Talk to your health care provider about how often you should have regular mammograms. Talk with your health care  provider about your test results, treatment options, and if necessary, the need for more tests. Vaccines  Your health care provider may recommend certain vaccines, such as: Influenza vaccine. This is recommended every year. Tetanus, diphtheria, and acellular pertussis (Tdap, Td) vaccine. You may need a Td booster every 10 years. Zoster vaccine. You may need this after age 64. Pneumococcal 13-valent conjugate (PCV13) vaccine. One dose is recommended after age 2. Pneumococcal polysaccharide (PPSV23) vaccine. One dose is recommended after age 14. Talk to your health care provider about which screenings and vaccines you need and how often you need them. This information is not intended to replace advice given to you by your health care provider. Make sure you discuss any questions you have with your health care provider. Document Released: 10/27/2015 Document Revised: 06/19/2016 Document Reviewed: 08/01/2015 Elsevier Interactive Patient Education  2017 [employer] Prevention in the Home Falls can cause injuries. They can happen to people of all ages. There are many things you can do to make your home safe and to help prevent falls. What can I do on the outside of my home? Regularly fix the edges of walkways and driveways and fix any cracks. Remove anything that might make you trip as you walk through a door, such as a raised step or threshold. Trim any bushes or trees on the path to your home. Use bright outdoor lighting. Clear any walking paths of anything that might make someone trip, such as rocks or tools. Regularly check to see if handrails are loose or broken. Make sure that both sides of any steps have handrails. Any raised decks and porches should have guardrails on the edges. Have any leaves, snow, or ice cleared regularly. Use sand or salt on walking paths during winter. Clean up any spills in your garage right away. This includes oil or grease spills. What can I do in the  bathroom? Use night lights. Install grab bars by the toilet and in the tub and shower. Do not use towel bars as grab bars. Use non-skid mats or decals in the tub or shower. If you need to sit down in the shower, use a plastic, non-slip stool. Keep the floor dry. Clean up any water that spills on the floor as soon as it happens. Remove soap buildup in the tub or shower regularly. Attach bath mats securely with double-sided non-slip rug tape. Do not have throw rugs and other things on the floor that can make you trip. What can I do in the bedroom? Use night lights. Make sure that you have a light by your bed that is easy to reach. Do not use any sheets or blankets that are too big for your bed. They should not hang down onto the floor. Have a firm chair that has side arms. You can use this for support while you get dressed. Do not have throw rugs and other things on the floor that can make you trip. What can I do  in the kitchen? Clean up any spills right away. Avoid walking on wet floors. Keep items that you use a lot in easy-to-reach places. If you need to reach something above you, use a strong step stool that has a grab bar. Keep electrical cords out of the way. Do not use floor polish or wax that makes floors slippery. If you must use wax, use non-skid floor wax. Do not have throw rugs and other things on the floor that can make you trip. What can I do with my stairs? Do not leave any items on the stairs. Make sure that there are handrails on both sides of the stairs and use them. Fix handrails that are broken or loose. Make sure that handrails are as long as the stairways. Check any carpeting to make sure that it is firmly attached to the stairs. Fix any carpet that is loose or worn. Avoid having throw rugs at the top or bottom of the stairs. If you do have throw rugs, attach them to the floor with carpet tape. Make sure that you have a light switch at the top of the stairs and the  bottom of the stairs. If you do not have them, ask someone to add them for you. What else can I do to help prevent falls? Wear shoes that: Do not have high heels. Have rubber bottoms. Are comfortable and fit you well. Are closed at the toe. Do not wear sandals. If you use a stepladder: Make sure that it is fully opened. Do not climb a closed stepladder. Make sure that both sides of the stepladder are locked into place. Ask someone to hold it for you, if possible. Clearly mark and make sure that you can see: Any grab bars or handrails. First and last steps. Where the edge of each step is. Use tools that help you move around (mobility aids) if they are needed. These include: Canes. Walkers. Scooters. Crutches. Turn on the lights when you go into a dark area. Replace any light bulbs as soon as they burn out. Set up your furniture so you have a clear path. Avoid moving your furniture around. If any of your floors are uneven, fix them. If there are any pets around you, be aware of where they are. Review your medicines with your doctor. Some medicines can make you feel dizzy. This can increase your chance of falling. Ask your doctor what other things that you can do to help prevent falls. This information is not intended to replace advice given to you by your health care provider. Make sure you discuss any questions you have with your health care provider. Document Released: 07/27/2009 Document Revised: 03/07/2016 Document Reviewed: 11/04/2014 Elsevier Interactive Patient Education  2017 [employer].

## 2022-09-27 NOTE — Progress Notes (Signed)
Subjective:   Kelly George is a 76 y.o. female who presents for Medicare Annual (Subsequent) preventive examination.  Review of Systems      Cardiac Risk Factors include: advanced age (>26mn, >>59women)     Objective:    Today's Vitals   09/27/22 1121  BP: 120/60  Pulse: (!) 58  Temp: 98.1 F (36.7 C)  TempSrc: Oral  SpO2: 97%  Weight: 166 lb 4.8 oz (75.4 kg)  Height: 5' 9.6" (1.768 m)   Body mass index is 24.14 kg/m.     09/27/2022   11:33 AM 03/14/2022   12:52 PM 09/11/2021    3:01 PM 04/13/2021   11:52 AM 11/28/2020   10:24 AM 11/21/2020    1:41 PM 11/13/2020    8:20 AM  Advanced Directives  Does Patient Have a Medical Advance Directive? Yes Yes Yes Yes Yes Yes Yes  Type of AParamedicof AHard RockLiving will HMila DoceLiving will HBrewsterLiving will HYampaLiving will HPenelopeLiving will HSuperiorLiving will HCayeyLiving will  Does patient want to make changes to medical advance directive?     No - Patient declined No - Patient declined No - Patient declined  Copy of [name]? No - copy requested No - copy requested No - copy requested No - copy requested No - copy requested    Would patient like information on creating a medical advance directive?     No - Patient declined      Current Medications (verified) Outpatient Encounter Medications as of 09/27/2022  Medication Sig   anastrozole (ARIMIDEX) 1 MG tablet TAKE 1 TABLET BY MOUTH EVERY DAY   Ascorbic Acid (VITAMIN C) 1000 MG tablet Take 1,000 mg by mouth daily.   CALCIUM CARBONATE-VITAMIN D PO Take 2 tablets by mouth daily.   Cholecalciferol (VITAMIN D3) 2000 UNITS TABS Take 5,000 Units by mouth daily.    glucosamine-chondroitin 500-400 MG tablet Take 1 tablet by mouth 2 (two) times daily.    Omega-3 Fatty Acids (EQL OMEGA 3 FISH  OIL) 1000 MG CPDR Take 2 capsules by mouth daily.   sertraline (ZOLOFT) 50 MG tablet TAKE 1 TABLET BY MOUTH EVERY DAY   simvastatin (ZOCOR) 10 MG tablet Take one tablet at night daily.   TURMERIC PO Take by mouth.   No facility-administered encounter medications on file as of 09/27/2022.    Allergies (verified) Penicillins and Tetracyclines & related   History: Past Medical History:  Diagnosis Date   Anxiety    Depression    Heart murmur    Hyperlipidemia    RBBB    Seasonal allergies    Past Surgical History:  Procedure Laterality Date   BREAST BIOPSY Right 10/2020   x2   BREAST EXCISIONAL BIOPSY Left    COLONOSCOPY  07/07/2001   MASTECTOMY Right 11/2020   MASTECTOMY W/ SENTINEL NODE BIOPSY Right 11/28/2020   Procedure: RIGHT MASTECTOMY WITH RIGHT AXILLARY SENTINEL LYMPH NODE BIOPSY;  Surgeon: WRolm Bookbinder MD;  [facility]  Service: General;  Laterality: Right;  RNFA, PEC BLOCK   SKIN SURGERY     DERM - nasal    TOOTH EXTRACTION     TUBAL LIGATION     Family History  Problem Relation Age of Onset   Breast cancer Mother 335  COPD Mother    Diabetes Father    Cancer Father  Brain   Heart attack Father 68   Alcohol abuse Father    Depression Father    Uterine cancer Maternal Aunt    Hearing loss Maternal Grandmother    Hypertension Maternal Grandfather    Kidney disease Maternal Grandfather    Stroke Maternal Grandfather    High Cholesterol Brother    Hypertension Brother    Hyperlipidemia Brother    Autoimmune disease Daughter    Ankylosing spondylitis Daughter    Healthy Son    Depression Son    Social History   Socioeconomic History   Marital status: Divorced    Spouse name: Not on file   Number of children: 2   Years of education: Not on file   Highest education level: Associate degree: occupational, Hotel manager, or vocational program  Occupational History   Occupation: Retired  Tobacco Use   Smoking status:  Never    Passive exposure: Past   Smokeless tobacco: Never  Vaping Use   Vaping Use: Never used  Substance and Sexual Activity   Alcohol use: Yes    Comment: occasionally   Drug use: No   Sexual activity: Not Currently  Other Topics Concern   Not on file  Social History Narrative   Live alone.     Patient is right-handed. She lives alone in a 1 story house, 3-4 steps to enter.    Social Determinants of Health   Financial Resource Strain: Low Risk  (09/27/2022)   Overall Financial Resource Strain (CARDIA)    Difficulty of Paying Living Expenses: Not hard at all  Food Insecurity: No Food Insecurity (09/27/2022)   Hunger Vital Sign    Worried About Running Out of Food in the Last Year: Never true    Ran Out of Food in the Last Year: Never true  Transportation Needs: No Transportation Needs (09/27/2022)   PRAPARE - Hydrologist (Medical): No    Lack of Transportation (Non-Medical): No  Physical Activity: Insufficiently Active (09/27/2022)   Exercise Vital Sign    Days of Exercise per Week: 2 days    Minutes of Exercise per Session: 60 min  Stress: No Stress Concern Present (09/27/2022)   [place]    Feeling of Stress : Not at all  Social Connections: Socially Isolated (09/27/2022)   Social Connection and Isolation Panel [NHANES]    Frequency of Communication with Friends and Family: More than three times a week    Frequency of Social Gatherings with Friends and Family: More than three times a week    Attends Religious Services: Never    Marine scientist or Organizations: No    Attends Music therapist: Never    Marital Status: Divorced    Tobacco Counseling Counseling given: Not Answered   Clinical Intake:  Pre-visit preparation completed: No  Pain : No/denies pain     BMI - recorded: 24.14 Nutritional Status: BMI of 19-24  Normal Nutritional  Risks: None Diabetes: No  How often do you need to have someone help you when you read instructions, pamphlets, or other written materials from your doctor or pharmacy?: 1 - Never  Diabetic?  No  Interpreter Needed?: No  Information entered by :: Rolene Arbour LPN   Activities of Daily Living    09/27/2022   11:29 AM  In your present state of health, do you have any difficulty performing the following activities:  Hearing? 0  Vision? 0  Difficulty concentrating or making decisions? 0  Walking or climbing stairs? 0  Dressing or bathing? 0  Doing errands, shopping? 0  Preparing Food and eating ? N  Using the Toilet? N  In the past six months, have you accidently leaked urine? Y  Comment Wears pads. Followed by PCP  Do you have problems with loss of bowel control? N  Managing your Medications? N  Managing your Finances? N  Housekeeping or managing your Housekeeping? N    Patient Care Team: Eulas Post, MD as PCP - General (Family Medicine) Paula Compton, MD (Obstetrics and Gynecology) Pieter Partridge, DO as Consulting Physician (Neurology) Nicholas Lose, MD as Consulting Physician (Hematology and Oncology) Rolm Bookbinder, MD as Consulting Physician (General Surgery)  Indicate any recent [place] you may have received from other than [facility] providers in the past year (date may be approximate).     Assessment:   This is a routine wellness examination for [place].  Hearing/Vision screen Hearing Screening - Comments:: Denies hearing difficulties   Vision Screening - Comments:: Wears rx glasses - up to date with routine eye exams with  Dr Nicki Reaper  Dietary issues and exercise activities discussed: Current Exercise Habits: Home exercise routine, Type of exercise: walking, Time (Minutes): 60, Frequency (Times/Week): 2, Weekly Exercise (Minutes/Week): 120, Intensity: Moderate, Exercise limited by: None identified   Goals Addressed               This  Visit's Progress     No currnt goals. (pt-stated)        Stay healthy.       Depression Screen    09/27/2022   11:28 AM 07/30/2022    9:38 AM 03/26/2022   10:36 AM 09/11/2021    3:02 PM 08/16/2020   11:35 AM 06/05/2020    9:26 AM 11/18/2019   10:50 AM  PHQ 2/9 Scores  PHQ - 2 Score 0 0 0 0 0 1 3  PHQ- 9 Score 0 3 1  0  11    Fall Risk    09/27/2022   11:31 AM 09/23/2022    1:23 PM 07/30/2022    9:39 AM 09/11/2021    3:01 PM 09/07/2021    9:10 AM  Fall Risk   Falls in the past year? '1 1 1 1 1  '$ Comment    lost balance   Number falls in past yr: 1 1 0 0 0  Injury with Fall? 0 1 0 0 0  Comment No injury followed by medical attention      Risk for fall due to : Impaired balance/gait  No Fall Risks Medication side effect;Impaired balance/gait   Follow up Falls prevention discussed  Falls evaluation completed Falls evaluation completed;Education provided;Falls prevention discussed     FALL RISK PREVENTION PERTAINING TO THE HOME:  Any stairs in or around the home? Yes  If so, are there any without handrails? No  Home free of loose throw rugs in walkways, pet beds, electrical cords, etc? Yes  Adequate lighting in your home to reduce risk of falls? Yes   ASSISTIVE DEVICES UTILIZED TO PREVENT FALLS:  Life alert? No  Use of a cane, walker or w/c? No  Grab bars in the bathroom? No  Shower chair or bench in shower? Yes  Elevated toilet seat or a handicapped toilet? Yes   TIMED UP AND GO:  Was the test performed? Yes .  Length of time to ambulate 10 feet: 10 sec.   Gait steady  and fast without use of assistive device  Cognitive Function:    08/14/2018    3:51 PM 05/20/2018    3:08 PM 03/11/2018   11:25 AM 06/25/2017   10:25 AM  MMSE - Mini Mental State Exam  Orientation to time '5 5 5 5  '$ Orientation to Place '5 5 5 5  '$ Registration '3 3 3 3  '$ Attention/ Calculation '5 5 5 5  '$ Recall '3 2 3 1  '$ Language- name 2 objects '2 2 2 2  '$ Language- repeat '1 1 1 1  '$ Language- follow 3  step command '3 3 3 3  '$ Language- read & follow direction '1 1 1 1  '$ Write a sentence '1 1 1 1  '$ Copy design 1 0 1 1  Total score '30 28 30 28        '$ 09/27/2022   11:34 AM 09/11/2021    3:04 PM 08/16/2019    9:49 AM  6CIT Screen  What Year? 0 points 0 points 0 points  What month? 0 points 0 points 0 points  What time? 0 points 0 points 0 points  Count back from 20 0 points 0 points 0 points  Months in reverse 0 points 0 points 0 points  Repeat phrase 0 points 2 points 0 points  Total Score 0 points 2 points 0 points    Immunizations Immunization History  Administered Date(s) Administered   Fluad Quad(high Dose 65+) 07/13/2019, 07/30/2022   Influenza Whole 08/28/2012   Influenza, High Dose Seasonal PF 08/18/2015, 09/30/2016, 08/04/2017, 07/16/2018, 07/31/2020, 08/06/2021   Influenza,inj,Quad PF,6+ Mos 07/08/2013, 08/10/2014   Moderna Covid-19 Vaccine Bivalent Booster 68yr & up 08/06/2021   Moderna Sars-Covid-2 Vaccination 01/13/2020, 02/02/2020   Pneumococcal Conjugate-13 01/05/2014   Pneumococcal Polysaccharide-23 04/13/2010   Tdap 08/06/2011   Zoster Recombinat (Shingrix) 10/22/2020, 02/27/2021   Zoster, Live 01/13/2007    TDAP status: Due, Education has been provided regarding the importance of this vaccine. Advised may receive this vaccine at local pharmacy or Health Dept. Aware to provide a copy of the vaccination record if obtained from local pharmacy or Health Dept. Verbalized acceptance and understanding.  Flu Vaccine status: Up to date  Pneumococcal vaccine status: Up to date  Covid-19 vaccine status: Completed vaccines  Qualifies for Shingles Vaccine? Yes   Zostavax completed Yes   Shingrix Completed?: Yes  Screening Tests Health Maintenance  Topic Date Due   DTaP/Tdap/Td (2 - Td or Tdap) 08/05/2021   COVID-19 Vaccine (5 - 2023-24 season) 10/13/2022 (Originally 06/14/2022)   Hepatitis C Screening  09/28/2023 (Originally 06/02/1964)   Medicare Annual Wellness  (AWV)  09/28/2023   Pneumonia Vaccine 76 Years old  Completed   INFLUENZA VACCINE  Completed   DEXA SCAN  Completed   Zoster Vaccines- Shingrix  Completed   HPV VACCINES  Aged Out   COLONOSCOPY (Pts [phone] coverage will need to be confirmed)  Discontinued    Health Maintenance  Health Maintenance Due  Topic Date Due   DTaP/Tdap/Td (2 - Td or Tdap) 08/05/2021    Colorectal cancer screening: No longer required.   Mammogram status: No longer required due to Age.  Bone Density status: Completed 04/05/21. Results reflect: Bone density results: OSTEOPOROSIS. Repeat every   years.  Lung Cancer Screening: (Low Dose CT Chest recommended if Age 78-80ears, 30 pack-year currently smoking OR have quit w/in 15years.) does not qualify.    Additional Screening:  Hepatitis C Screening: does qualify; Completed Deferred  Vision Screening: Recommended annual ophthalmology exams for  early detection of glaucoma and other disorders of the eye. Is the patient up to date with their annual eye exam?  Yes  Who is the provider or what is the name of the office in which the patient attends annual eye exams? Dr Nicki Reaper If pt is not established with a provider, would they like to be referred to a provider to establish care? No .   Dental Screening: Recommended annual dental exams for proper oral hygiene  Community Resource Referral / Chronic Care Management:  CRR required this visit?  No   CCM required this visit?  No      Plan:     I have personally reviewed and noted the following in the patient's chart:   Medical and social history Use of alcohol, tobacco or illicit drugs  Current medications and supplements including opioid prescriptions. Patient is not currently taking opioid prescriptions. Functional ability and status Nutritional status Physical activity Advanced directives List of other physicians Hospitalizations, surgeries, and ER visits in previous 12  months Vitals Screenings to include cognitive, depression, and falls Referrals and appointments  In addition, I have reviewed and discussed with patient certain preventive protocols, quality metrics, and best practice recommendations. A written personalized care plan for preventive services as well as general preventive health recommendations were provided to patient.     Criselda Peaches, LPN   [id number]   Nurse Notes: None

## 2022-10-03 LAB — SIGNATERA
SIGNATERA MTM READOUT: 0 MTM/ml
SIGNATERA TEST RESULT: NEGATIVE

## 2022-10-10 ENCOUNTER — Telehealth: Payer: Self-pay

## 2022-10-10 NOTE — Telephone Encounter (Signed)
Called pt per MD to advise [facility] testing was negative/not detected. Pt verbalized understanding of results and knows [facility] will be in touch to schedule 3 mo repeat lab.   

## 2022-10-22 ENCOUNTER — Other Ambulatory Visit: Payer: Self-pay | Admitting: Family Medicine

## 2022-10-22 DIAGNOSIS — F419 Anxiety disorder, unspecified: Secondary | ICD-10-CM

## 2022-10-22 DIAGNOSIS — F339 Major depressive disorder, recurrent, unspecified: Secondary | ICD-10-CM

## 2022-10-26 ENCOUNTER — Other Ambulatory Visit: Payer: Self-pay | Admitting: Hematology and Oncology

## 2022-10-28 NOTE — Telephone Encounter (Signed)
Refilled per most recent Dr. Lindi Adie OV note: antiestrogen therapy with anastrozole 1 mg daily x5 to 7 years starting 12/12/2020

## 2022-11-12 DIAGNOSIS — H25812 Combined forms of age-related cataract, left eye: Secondary | ICD-10-CM | POA: Diagnosis not present

## 2022-11-12 DIAGNOSIS — H25813 Combined forms of age-related cataract, bilateral: Secondary | ICD-10-CM | POA: Diagnosis not present

## 2022-11-12 DIAGNOSIS — H25811 Combined forms of age-related cataract, right eye: Secondary | ICD-10-CM | POA: Diagnosis not present

## 2022-12-12 NOTE — Progress Notes (Signed)
Patient Care Team: Eulas Post, MD as PCP - General (Family Medicine) Paula Compton, MD (Obstetrics and Gynecology) Pieter Partridge, DO as Consulting Physician (Neurology) Nicholas Lose, MD as Consulting Physician (Hematology and Oncology) Rolm Bookbinder, MD as Consulting Physician (General Surgery)  DIAGNOSIS: No diagnosis found.  SUMMARY OF ONCOLOGIC HISTORY: Oncology History  Malignant neoplasm of upper-outer quadrant of right breast in female, estrogen receptor positive ([place])  11/10/2020 Initial Diagnosis   Screening mammogram showed calcifications and a possible mass in the right breast. Diagnostic mammogram and US showed in the right breast, a 0.9cm cyst at the 8 o'clock position, a 1.0cm lesion at the 12 o'clock position, and no right axillary adenopathy. 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   11/28/2020 Cancer Staging   Staging form: Breast, AJCC 8th Edition - Pathologic stage from 11/28/2020: Stage IA (pT1c, pN0, cM0, G2, ER+, PR+, HER2-) - Signed by Gardenia Phlegm, NP on 04/10/2021 Stage prefix: Initial diagnosis Histologic grading system: 3 grade system   12/2020 -  Anti-estrogen oral therapy   Anastrozole daily     CHIEF COMPLIANT: Follow-up of right breast cancer    INTERVAL HISTORY: Kelly George is a 77 y.o. with above-mentioned history of right breast cancer having undergone mastectomy, currently on  antiestrogen therapy with anastrozole. She presents to the clinic today for follow-up.      ALLERGIES:  is allergic to penicillins and tetracyclines & related.  MEDICATIONS:  Current Outpatient Medications  Medication Sig Dispense Refill   anastrozole (ARIMIDEX) 1 MG tablet TAKE 1 TABLET BY MOUTH EVERY DAY 90 tablet 2   Ascorbic Acid (VITAMIN C) 1000 MG tablet Take 1,000 mg by mouth daily.     CALCIUM CARBONATE-VITAMIN D PO Take 2 tablets by mouth daily.     Cholecalciferol (VITAMIN D3) 2000 UNITS TABS Take 5,000 Units by mouth daily.      glucosamine-chondroitin 500-400 MG tablet Take 1 tablet by mouth 2 (two) times daily.      Omega-3 Fatty Acids (EQL OMEGA 3 FISH OIL) 1000 MG CPDR Take 2 capsules by mouth daily.     sertraline (ZOLOFT) 50 MG tablet TAKE 1 TABLET BY MOUTH EVERY DAY 90 tablet 1   simvastatin (ZOCOR) 10 MG tablet Take one tablet at night daily. 90 tablet 3   TURMERIC PO Take by mouth.     No current facility-administered medications for this visit.    PHYSICAL EXAMINATION: ECOG PERFORMANCE STATUS: {CHL ONC ECOG PS:[id number]}  There were no vitals filed for this visit. There were no vitals filed for this visit.  BREAST:*** No palpable masses or nodules in either right or left breasts. No palpable axillary supraclavicular or infraclavicular adenopathy no breast tenderness or nipple discharge. (exam performed in the presence of a chaperone)  LABORATORY DATA:  I have reviewed the data as listed    Latest Ref Rng & Units 07/30/2022   10:12 AM 12/15/2020   11:13 AM 11/18/2019  11:06 AM  CMP  Glucose 70 - 99 mg/dL 97  98  93   BUN 6 - 23 mg/dL '18  19  18   '$ Creatinine 0.40 - 1.20 mg/dL 0.56  0.58  0.66   Sodium 135 - 145 mEq/L 139  140  140   Potassium 3.5 - 5.1 mEq/L 4.5  5.0  4.2   Chloride 96 - 112 mEq/L 102  104  100   CO2 19 - 32 mEq/L 32  29  25   Calcium 8.4 - 10.5 mg/dL 10.3  9.7  10.0   Total Protein 6.0 - 8.3 g/dL 7.6  6.6  7.0   Total Bilirubin 0.2 -  1.2 mg/dL 0.8  0.7  0.7   Alkaline Phos 39 - 117 U/L 66  66  76   AST 0 - 37 U/L '28  18  21   '$ ALT 0 - 35 U/L '30  19  24     '$ Lab Results  Component Value Date   WBC 4.0 11/18/2019   HGB 14.6 11/18/2019   HCT 43.4 11/18/2019   MCV 95 11/18/2019   PLT 262 11/18/2019   NEUTROABS 2.1 11/18/2019    ASSESSMENT & PLAN:  No problem-specific Assessment & Plan notes found for this encounter.    No orders of the defined types were placed in this encounter.  The patient has a good understanding of the overall plan. she agrees with it. she will call with any problems that may develop before the next visit here. Total time spent: 30 mins including face to face time and time spent for planning, charting and co-ordination of care   Suzzette Righter, [place] 12/12/22    I Gardiner Coins am acting as a Education administrator for [employer]  ***

## 2022-12-18 ENCOUNTER — Telehealth: Payer: Self-pay | Admitting: *Deleted

## 2022-12-18 ENCOUNTER — Ambulatory Visit (HOSPITAL_COMMUNITY)
Admission: RE | Admit: 2022-12-18 | Discharge: 2022-12-18 | Disposition: A | Discharge status: Home / Self Care | Payer: Medicare HMO | Source: Ambulatory Visit | Attending: Hematology and Oncology | Admitting: Hematology and Oncology

## 2022-12-18 ENCOUNTER — Inpatient Hospital Stay: Payer: Medicare HMO | Attending: Hematology and Oncology | Admitting: Hematology and Oncology

## 2022-12-18 VITALS — BP 148/83 | HR 73 | Temp 97.5°F | Resp 18 | Ht 69.6 in | Wt 170.5 lb

## 2022-12-18 DIAGNOSIS — C50411 Malignant neoplasm of upper-outer quadrant of right female breast: Secondary | ICD-10-CM | POA: Insufficient documentation

## 2022-12-18 DIAGNOSIS — Z170 Estrogen receptor positive status [ER+]: Secondary | ICD-10-CM | POA: Insufficient documentation

## 2022-12-18 DIAGNOSIS — Z79811 Long term (current) use of aromatase inhibitors: Secondary | ICD-10-CM | POA: Diagnosis not present

## 2022-12-18 DIAGNOSIS — Z9011 Acquired absence of right breast and nipple: Secondary | ICD-10-CM | POA: Diagnosis not present

## 2022-12-18 DIAGNOSIS — Z79899 Other long term (current) drug therapy: Secondary | ICD-10-CM | POA: Diagnosis not present

## 2022-12-18 DIAGNOSIS — M8580 Other specified disorders of bone density and structure, unspecified site: Secondary | ICD-10-CM | POA: Insufficient documentation

## 2022-12-18 DIAGNOSIS — R42 Dizziness and giddiness: Secondary | ICD-10-CM | POA: Diagnosis not present

## 2022-12-18 MED ORDER — GADOBUTROL 1 MMOL/ML IV SOLN
8.0000 mL | Freq: Once | INTRAVENOUS | Status: AC | PRN
  Administered 2022-12-18: 8 mL via INTRAVENOUS

## 2022-12-18 NOTE — Telephone Encounter (Signed)
Per MD request RN placed call to pt regarding recent Brain MRI results being negative.  Per MD pt needing to stop Anastrozole x 2 weeks to see if dizziness and headaches resolve.  Pt educated, f/u appt scheduled, and pt verbalized understanding.

## 2022-12-18 NOTE — Assessment & Plan Note (Addendum)
11/10/2020: Screening mammogram showed calcifications and a possible mass in the right breast. Diagnostic mammogram and US showed in the right breast, a 0.9cm cyst at the 8 o'clock position, a 1.0cm lesion at the 12 o'clock position, and no right axillary adenopathy. 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   11/28/20: Rt mastectomy:Grade 2 ILC 1.1 cm and Grade 2 IDC 0.2 cm, 0/2 LN 03/2021: Bone density: T score -1.2 (was -0.7:) Mild osteopenia   Plan: Adjuvant antiestrogen therapy with anastrozole 1 mg daily x5 to 7 years starting 12/12/2020   Anastrozole Toxicities: Tolerating it extremely well without any problems or concerns.  Occasional joint stiffness could be related to aging and not related to the treatment.   Breast Cancer Surveillance: 1. Mammogram  Left breast: 12/19/21: Benign breast density category C 2. Breast Exam 12/18/2022: Benign   Dizziness, disoriented intermittently: Patient Doximity but does not last sometimes entire day.  This is not associated with vertigo.  I recommended that we obtain a brain MRI for further evaluation today.  If the MRI is normal then we can discuss discontinuation of anastrozole, then 2 weeks later reassessing her symptoms and figuring out if switching treatment could be made.  Return to clinic based upon brain MRI result

## 2022-12-19 ENCOUNTER — Other Ambulatory Visit: Payer: Self-pay | Admitting: Hematology and Oncology

## 2022-12-19 DIAGNOSIS — Z1231 Encounter for screening mammogram for malignant neoplasm of breast: Secondary | ICD-10-CM

## 2022-12-24 ENCOUNTER — Encounter: Payer: Self-pay | Admitting: Adult Health

## 2022-12-24 ENCOUNTER — Ambulatory Visit (INDEPENDENT_AMBULATORY_CARE_PROVIDER_SITE_OTHER): Payer: Medicare HMO | Admitting: Adult Health

## 2022-12-24 VITALS — BP 108/72 | HR 71 | Temp 98.9°F | Ht 69.5 in | Wt 168.0 lb

## 2022-12-24 DIAGNOSIS — J988 Other specified respiratory disorders: Secondary | ICD-10-CM

## 2022-12-24 MED ORDER — PREDNISONE 10 MG PO TABS: 10.0000 mg | ORAL_TABLET | Freq: Every day | ORAL | 0 refills | Status: AC

## 2022-12-24 MED ORDER — ALBUTEROL SULFATE HFA 108 (90 BASE) MCG/ACT IN AERS: 2.0000 | INHALATION_SPRAY | Freq: Four times a day (QID) | RESPIRATORY_TRACT | 0 refills | Status: DC | PRN

## 2022-12-24 MED ORDER — IPRATROPIUM-ALBUTEROL 0.5-2.5 (3) MG/3ML IN SOLN
3.0000 mL | Freq: Once | RESPIRATORY_TRACT | Status: AC
  Administered 2022-12-24: 3 mL via RESPIRATORY_TRACT

## 2022-12-24 NOTE — Progress Notes (Signed)
Subjective:    Patient ID: Kelly George, female    DOB: 18-Aug-1946, 77 y.o.   MRN: [id number]  Cough  Chest Pain    77 year old female who  has a past medical history of Anxiety, Depression, Heart murmur, Hyperlipidemia, RBBB, and Seasonal allergies.  She presents to the office today for an acute issue. Her symptoms started about 4 days ago. Symptoms include that of post nasal drip, cough that is mostly dry, when she does get phelgm that is yellow, chest tightness, wheezing, and shortness of breath.   She denies fevers, chills, or sinus pain/pressure.   She has not been using anything over the counter   No history of smoking or asthma    Review of Systems See HPI   Past Medical History:  Diagnosis Date   Anxiety    Depression    Heart murmur    Hyperlipidemia    RBBB    Seasonal allergies     Social History   Socioeconomic History   Marital status: Divorced    Spouse name: Not on file   Number of children: 2   Years of education: Not on file   Highest education level: Associate degree: occupational, Hotel manager, or vocational program  Occupational History   Occupation: Retired  Tobacco Use   Smoking status: Never    Passive exposure: Past   Smokeless tobacco: Never  Vaping Use   Vaping Use: Never used  Substance and Sexual Activity   Alcohol use: Yes    Comment: occasionally   Drug use: No   Sexual activity: Not Currently  Other Topics Concern   Not on file  Social History Narrative   Live alone.     Patient is right-handed. She lives alone in a 1 story house, 3-4 steps to enter.    Social Determinants of Health   Financial Resource Strain: Low Risk  (09/27/2022)   Overall Financial Resource Strain (CARDIA)    Difficulty of Paying Living Expenses: Not hard at all  Food Insecurity: No Food Insecurity (09/27/2022)   Hunger Vital Sign    Worried About Running Out of Food in the Last Year: Never true    Ran Out of Food in the Last Year: Never  true  Transportation Needs: No Transportation Needs (09/27/2022)   PRAPARE - Hydrologist (Medical): No    Lack of Transportation (Non-Medical): No  Physical Activity: Insufficiently Active (09/27/2022)   Exercise Vital Sign    Days of Exercise per Week: 2 days    Minutes of Exercise per Session: 60 min  Stress: No Stress Concern Present (09/27/2022)   [place]    Feeling of Stress : Not at all  Social Connections: Socially Isolated (09/27/2022)   Social Connection and Isolation Panel [NHANES]    Frequency of Communication with Friends and Family: More than three times a week    Frequency of Social Gatherings with Friends and Family: More than three times a week    Attends Religious Services: Never    Marine scientist or Organizations: No    Attends Archivist Meetings: Never    Marital Status: Divorced  Human resources officer Violence: Not At Risk (09/27/2022)   Humiliation, Afraid, Rape, and Kick questionnaire    Fear of Current or Ex-Partner: No    Emotionally Abused: No    Physically Abused: No    Sexually Abused: No    Past  Surgical History:  Procedure Laterality Date   BREAST BIOPSY Right 10/2020   x2   BREAST EXCISIONAL BIOPSY Left    COLONOSCOPY  07/07/2001   MASTECTOMY Right 11/2020   MASTECTOMY W/ SENTINEL NODE BIOPSY Right 11/28/2020   Procedure: RIGHT MASTECTOMY WITH RIGHT AXILLARY SENTINEL LYMPH NODE BIOPSY;  Surgeon: Rolm Bookbinder, MD;  Location: [facility];  Service: General;  Laterality: Right;  RNFA, PEC BLOCK   SKIN SURGERY     DERM - nasal    TOOTH EXTRACTION     TUBAL LIGATION      Family History  Problem Relation Age of Onset   Breast cancer Mother 80   COPD Mother    Diabetes Father    Cancer Father        Brain   Heart attack Father 4   Alcohol abuse Father    Depression Father    Uterine cancer Maternal Aunt     Hearing loss Maternal Grandmother    Hypertension Maternal Grandfather    Kidney disease Maternal Grandfather    Stroke Maternal Grandfather    High Cholesterol Brother    Hypertension Brother    Hyperlipidemia Brother    Autoimmune disease Daughter    Ankylosing spondylitis Daughter    Healthy Son    Depression Son     Allergies  Allergen Reactions   Penicillins Rash   Tetracyclines & Related Palpitations    Current Outpatient Medications on File Prior to Visit  Medication Sig Dispense Refill   anastrozole (ARIMIDEX) 1 MG tablet TAKE 1 TABLET BY MOUTH EVERY DAY 90 tablet 2   Ascorbic Acid (VITAMIN C) 1000 MG tablet Take 1,000 mg by mouth daily.     CALCIUM CARBONATE-VITAMIN D PO Take 2 tablets by mouth daily.     Cholecalciferol (VITAMIN D3) 2000 UNITS TABS Take 5,000 Units by mouth daily.      glucosamine-chondroitin 500-400 MG tablet Take 1 tablet by mouth 2 (two) times daily.      ibuprofen (ADVIL) 200 MG tablet Take 200 mg by mouth every 6 (six) hours as needed. 2 tabs daily prn     Omega-3 Fatty Acids (EQL OMEGA 3 FISH OIL) 1000 MG CPDR Take 2 capsules by mouth daily.     sertraline (ZOLOFT) 50 MG tablet TAKE 1 TABLET BY MOUTH EVERY DAY 90 tablet 1   simvastatin (ZOCOR) 10 MG tablet Take one tablet at night daily. 90 tablet 3   TURMERIC PO Take by mouth.     No current facility-administered medications on file prior to visit.    BP 108/72   Pulse 71   Temp 98.9 F (37.2 C) (Oral)   Ht 5' 9.5" (1.765 m)   Wt 168 lb (76.2 kg)   SpO2 96%   BMI 24.45 kg/m       Objective:   Physical Exam Vitals and nursing note reviewed.  Constitutional:      Appearance: Normal appearance.  Cardiovascular:     Rate and Rhythm: Normal rate and regular rhythm.     Pulses: Normal pulses.     Heart sounds: Normal heart sounds.  Pulmonary:     Effort: Pulmonary effort is normal.     Breath sounds: Decreased air movement present. No stridor. Examination of the right-upper field  reveals wheezing and rhonchi. Examination of the left-upper field reveals wheezing and rhonchi. Examination of the right-middle field reveals wheezing and rhonchi. Examination of the left-middle field reveals wheezing and rhonchi. Examination of the right-lower  field reveals wheezing and rhonchi. Examination of the left-lower field reveals wheezing and rhonchi. Wheezing and rhonchi present. No rales.  Abdominal:     General: Abdomen is flat.     Palpations: Abdomen is soft.  Musculoskeletal:        General: Normal range of motion.  Skin:    General: Skin is warm and dry.  Neurological:     General: No focal deficit present.     Mental Status: She is alert and oriented to person, place, and time.  Psychiatric:        Mood and Affect: Mood normal.        Behavior: Behavior normal.        Thought Content: Thought content normal.        Judgment: Judgment normal.        Assessment & Plan:  1. Respiratory infection - Likely viral or allergy mediated.  - ipratropium-albuterol (DUONEB) 0.5-2.5 (3) MG/3ML nebulizer solution 3 mL - predniSONE (DELTASONE) 10 MG tablet; Take 1 tablet (10 mg total) by mouth daily with breakfast for 5 days.  Dispense: 5 tablet; Refill: 0 - albuterol (VENTOLIN HFA) 108 (90 Base) MCG/ACT inhaler; Inhale 2 puffs into the lungs every 6 (six) hours as needed for wheezing or shortness of breath.  Dispense: 8 g; Refill: 0   Wheezing and rhonchi were resolved after duoneb and she reported being able to breath easier.   Dorothyann Peng, NP

## 2022-12-26 ENCOUNTER — Telehealth: Payer: Self-pay

## 2022-12-26 NOTE — Telephone Encounter (Signed)
Pt called and states she stopped anastrozole 12/19/22 per MD recommendation after brain MRI was negative for METs. She states her joint pain and dizziness has resolved since stopping anastrozole. Advised pt to keep appt 3/21 as scheduled to discuss alternative therapy with MD. She verbalized agreement and understanding.

## 2023-01-02 ENCOUNTER — Inpatient Hospital Stay (HOSPITAL_BASED_OUTPATIENT_CLINIC_OR_DEPARTMENT_OTHER): Payer: Medicare HMO | Admitting: Hematology and Oncology

## 2023-01-02 VITALS — BP 148/79 | HR 70 | Temp 97.8°F | Resp 18 | Ht 69.5 in | Wt 171.2 lb

## 2023-01-02 DIAGNOSIS — C50411 Malignant neoplasm of upper-outer quadrant of right female breast: Secondary | ICD-10-CM | POA: Diagnosis not present

## 2023-01-02 DIAGNOSIS — M8580 Other specified disorders of bone density and structure, unspecified site: Secondary | ICD-10-CM | POA: Diagnosis not present

## 2023-01-02 DIAGNOSIS — Z170 Estrogen receptor positive status [ER+]: Secondary | ICD-10-CM | POA: Diagnosis not present

## 2023-01-02 DIAGNOSIS — Z79899 Other long term (current) drug therapy: Secondary | ICD-10-CM | POA: Diagnosis not present

## 2023-01-02 DIAGNOSIS — Z79811 Long term (current) use of aromatase inhibitors: Secondary | ICD-10-CM | POA: Diagnosis not present

## 2023-01-02 DIAGNOSIS — Z9011 Acquired absence of right breast and nipple: Secondary | ICD-10-CM | POA: Diagnosis not present

## 2023-01-02 NOTE — Progress Notes (Signed)
Patient Care Team: Eulas Post, MD as PCP - General (Family Medicine) Paula Compton, MD (Obstetrics and Gynecology) Pieter Partridge, DO as Consulting Physician (Neurology) Nicholas Lose, MD as Consulting Physician (Hematology and Oncology) Rolm Bookbinder, MD as Consulting Physician (General Surgery)  DIAGNOSIS:  Encounter Diagnosis  Name Primary?   Malignant neoplasm of upper-outer quadrant of right breast in female, estrogen receptor positive ([place]) Yes    SUMMARY OF ONCOLOGIC HISTORY: Oncology History  Malignant neoplasm of upper-outer quadrant of right breast in female, estrogen receptor positive ([place])  11/10/2020 Initial Diagnosis   Screening mammogram showed calcifications and a possible mass in the right breast. Diagnostic mammogram and US showed in the right breast, a 0.9cm cyst at the 8 o'clock position, a 1.0cm lesion at the 12 o'clock position, and no right axillary adenopathy. 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   11/28/2020 Cancer Staging   Staging form: Breast, AJCC 8th Edition - Pathologic stage from 11/28/2020: Stage IA (pT1c, pN0, cM0, G2, ER+, PR+, HER2-) - Signed by Gardenia Phlegm, NP on 04/10/2021 Stage prefix: Initial diagnosis Histologic grading system: 3 grade system   12/2020 -  Anti-estrogen oral therapy   Anastrozole daily     CHIEF COMPLIANT: Follow-up to discuss anastrozole side effects and review  scans  INTERVAL HISTORY: Kelly George is a  77 y.o. with above-mentioned history of right breast cancer having undergone mastectomy, currently on antiestrogen therapy with anastrozole. She presents to the clinic today for follow-up. She reports that the dizziness has subsided.   ALLERGIES:  is allergic to penicillins and tetracyclines & related.  MEDICATIONS:  Current Outpatient Medications  Medication Sig Dispense Refill   albuterol (VENTOLIN HFA) 108 (90 Base) MCG/ACT inhaler Inhale 2 puffs into the lungs every 6 (six) hours as needed for wheezing or shortness of breath. 8 g 0   anastrozole (ARIMIDEX) 1 MG tablet TAKE 1 TABLET BY MOUTH EVERY DAY 90 tablet 2   Ascorbic Acid (VITAMIN C) 1000 MG tablet Take 1,000 mg by mouth daily.     CALCIUM CARBONATE-VITAMIN D PO Take 2 tablets by mouth daily.     Cholecalciferol (VITAMIN D3) 2000 UNITS TABS Take 5,000 Units by mouth daily.      glucosamine-chondroitin 500-400 MG tablet Take 1 tablet by mouth 2 (two) times daily.      ibuprofen (ADVIL) 200 MG tablet Take 200 mg by mouth every 6 (six) hours as needed. 2 tabs daily prn     Omega-3 Fatty Acids (EQL OMEGA 3 FISH OIL) 1000 MG CPDR Take 2 capsules by mouth daily.     sertraline (ZOLOFT) 50 MG tablet TAKE 1 TABLET BY MOUTH EVERY DAY 90 tablet 1   simvastatin (ZOCOR) 10 MG tablet Take one tablet at night daily. 90 tablet 3   TURMERIC PO Take by mouth.     No current facility-administered medications for this visit.    PHYSICAL EXAMINATION: ECOG PERFORMANCE STATUS: 1 - Symptomatic  but completely ambulatory  Vitals:   01/02/23 1149  BP: (!) 148/79  Pulse: 70  Resp: 18  Temp: 97.8 F (36.6 C)  SpO2: 100%   Filed Weights   01/02/23 1149  Weight: 171 lb 3.2 oz (77.7 kg)     LABORATORY DATA:  I have reviewed the data as listed    Latest Ref Rng & Units 07/30/2022   10:12 AM 12/15/2020   11:13 AM 11/18/2019   11:06 AM  CMP  Glucose 70 - 99 mg/dL 97  98  93   BUN 6 - 23 mg/dL  18  19  18    Creatinine 0.40 - 1.20 mg/dL 0.56  0.58  0.66   Sodium 135 - 145 mEq/L 139  140  140   Potassium 3.5 - 5.1 mEq/L 4.5  5.0  4.2   Chloride 96 - 112 mEq/L 102  104  100   CO2 19 - 32 mEq/L 32  29  25   Calcium 8.4 - 10.5 mg/dL 10.3  9.7  10.0   Total Protein 6.0 - 8.3 g/dL 7.6  6.6  7.0   Total Bilirubin 0.2 - 1.2 mg/dL 0.8  0.7  0.7   Alkaline Phos 39 - 117 U/L 66  66  76   AST 0 - 37 U/L 28  18  21    ALT 0 - 35 U/L 30  19  24      Lab Results  Component Value Date   WBC 4.0 11/18/2019   HGB 14.6 11/18/2019   HCT 43.4 11/18/2019   MCV 95 11/18/2019   PLT 262 11/18/2019   NEUTROABS 2.1 11/18/2019    ASSESSMENT & PLAN:  Malignant neoplasm of upper-outer quadrant of right breast in female, estrogen receptor positive ([place]) 11/10/2020: Screening mammogram showed calcifications and a possible mass in the right breast. Diagnostic mammogram and US showed in the right breast, a 0.9cm cyst at the 8 o'clock position, a 1.0cm lesion at the 12 o'clock position, and no right axillary adenopathy. 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   11/28/20: Rt mastectomy:Grade 2 ILC 1.1 cm and Grade 2 IDC 0.2 cm, 0/2 LN 03/2021: Bone density: T score -1.2 (was -0.7:) Mild osteopenia   Plan: Adjuvant antiestrogen therapy with anastrozole 1 mg daily x5 to 7 years starting 12/12/2020   Anastrozole Toxicities: Tolerating it extremely well without any problems or concerns.  Occasional joint stiffness could be related to aging and not related to the treatment.   Breast Cancer Surveillance: 1. Mammogram  Left breast: 12/19/21: Benign breast density category C 2. Breast Exam 12/18/2022: Benign  Dizziness: Brain MRI 12/18/2022: Benign She will resume anastrozole therapy since the dizziness has disappeared.  She was recently placed on steroids for upper respiratory infection and it appears that the dizziness went away after she started the  steroids.    No orders of the defined types were placed in this encounter.  The patient has a good understanding of the overall plan. she agrees with it. she will call with any problems that may develop before the next visit here. Total time spent: 30 mins including face to face time and time spent for planning, charting and co-ordination of care   Harriette Ohara, MD 01/02/23    I Gardiner Coins am acting as a Education administrator for [employer]  I have reviewed the above documentation for accuracy and completeness, and I agree with the above.

## 2023-01-02 NOTE — Assessment & Plan Note (Addendum)
11/10/2020: Screening mammogram showed calcifications and a possible mass in the right breast. Diagnostic mammogram and US showed in the right breast, a 0.9cm cyst at the 8 o'clock position, a 1.0cm lesion at the 12 o'clock position, and no right axillary adenopathy. 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   11/28/20: Rt mastectomy:Grade 2 ILC 1.1 cm and Grade 2 IDC 0.2 cm, 0/2 LN 03/2021: Bone density: T score -1.2 (was -0.7:) Mild osteopenia   Plan: Adjuvant antiestrogen therapy with anastrozole 1 mg daily x5 to 7 years starting 12/12/2020   Anastrozole Toxicities: Tolerating it extremely well without any problems or concerns.  Occasional joint stiffness could be related to aging and not related to the treatment.   Breast Cancer Surveillance: 1. Mammogram  Left breast: 12/19/21: Benign breast density category C 2. Breast Exam 12/18/2022: Benign  Dizziness: Brain MRI 12/18/2022: Benign Kelly George will resume anastrozole therapy since the dizziness has disappeared.  Kelly George was recently placed on steroids for upper respiratory infection and it appears that the dizziness went away after Kelly George started the steroids.

## 2023-01-15 ENCOUNTER — Other Ambulatory Visit: Payer: Self-pay | Admitting: Adult Health

## 2023-01-15 DIAGNOSIS — J988 Other specified respiratory disorders: Secondary | ICD-10-CM

## 2023-01-16 ENCOUNTER — Telehealth: Payer: Self-pay

## 2023-01-16 LAB — SIGNATERA
SIGNATERA MTM READOUT: 0 MTM/ml
SIGNATERA TEST RESULT: NEGATIVE

## 2023-01-16 NOTE — Telephone Encounter (Signed)
Called pt per MD to advise [facility] testing was negative/not detected. Pt verbalized understanding of results and knows [facility] will be in touch to schedule 3 mo repeat lab.   

## 2023-02-05 ENCOUNTER — Ambulatory Visit
Admission: RE | Admit: 2023-02-05 | Discharge: 2023-02-05 | Disposition: A | Discharge status: Home / Self Care | Payer: Medicare HMO | Source: Ambulatory Visit | Attending: Hematology and Oncology | Admitting: Hematology and Oncology

## 2023-02-05 ENCOUNTER — Other Ambulatory Visit: Payer: Self-pay | Admitting: Hematology and Oncology

## 2023-02-05 DIAGNOSIS — Z1231 Encounter for screening mammogram for malignant neoplasm of breast: Secondary | ICD-10-CM

## 2023-02-27 DIAGNOSIS — Z85828 Personal history of other malignant neoplasm of skin: Secondary | ICD-10-CM | POA: Diagnosis not present

## 2023-02-27 DIAGNOSIS — Z872 Personal history of diseases of the skin and subcutaneous tissue: Secondary | ICD-10-CM | POA: Diagnosis not present

## 2023-02-27 DIAGNOSIS — D485 Neoplasm of uncertain behavior of skin: Secondary | ICD-10-CM | POA: Diagnosis not present

## 2023-02-27 DIAGNOSIS — L439 Lichen planus, unspecified: Secondary | ICD-10-CM | POA: Diagnosis not present

## 2023-02-27 DIAGNOSIS — L821 Other seborrheic keratosis: Secondary | ICD-10-CM | POA: Diagnosis not present

## 2023-02-27 DIAGNOSIS — L814 Other melanin hyperpigmentation: Secondary | ICD-10-CM | POA: Diagnosis not present

## 2023-02-27 DIAGNOSIS — Z08 Encounter for follow-up examination after completed treatment for malignant neoplasm: Secondary | ICD-10-CM | POA: Diagnosis not present

## 2023-02-27 DIAGNOSIS — D225 Melanocytic nevi of trunk: Secondary | ICD-10-CM | POA: Diagnosis not present

## 2023-02-27 DIAGNOSIS — Z09 Encounter for follow-up examination after completed treatment for conditions other than malignant neoplasm: Secondary | ICD-10-CM | POA: Diagnosis not present

## 2023-02-27 DIAGNOSIS — L570 Actinic keratosis: Secondary | ICD-10-CM | POA: Diagnosis not present

## 2023-03-11 ENCOUNTER — Encounter: Payer: Self-pay | Admitting: Adult Health

## 2023-03-11 ENCOUNTER — Ambulatory Visit (INDEPENDENT_AMBULATORY_CARE_PROVIDER_SITE_OTHER): Payer: Medicare HMO | Admitting: Adult Health

## 2023-03-11 VITALS — BP 138/82 | HR 71 | Temp 98.2°F | Ht 69.5 in | Wt 169.8 lb

## 2023-03-11 DIAGNOSIS — F419 Anxiety disorder, unspecified: Secondary | ICD-10-CM | POA: Diagnosis not present

## 2023-03-11 DIAGNOSIS — R030 Elevated blood-pressure reading, without diagnosis of hypertension: Secondary | ICD-10-CM | POA: Diagnosis not present

## 2023-03-11 DIAGNOSIS — F339 Major depressive disorder, recurrent, unspecified: Secondary | ICD-10-CM | POA: Diagnosis not present

## 2023-03-11 MED ORDER — SERTRALINE HCL 100 MG PO TABS: 100.0000 mg | ORAL_TABLET | Freq: Every day | ORAL | 0 refills | Status: DC

## 2023-03-11 NOTE — Patient Instructions (Signed)
It was great seeing you today   I am going to increase your Zoloft to 100 mg daily.   Follow up with Dr. Caryl Never in a few weeks to see how you are doing

## 2023-03-11 NOTE — Progress Notes (Signed)
Subjective:    Patient ID: Kelly George, female    DOB: Nov 16, 1945, 77 y.o.   MRN: [id number]  Anxiety     77 year old female who  has a past medical history of Anxiety, Depression, Heart murmur, Hyperlipidemia, RBBB, and Seasonal allergies.  She is a patient of Dr. Caryl Never who I am seeing today for anxiety  She is currently taking Zoloft 50 mg daily for depression that is prescribed by her PCP.  Over the last 3 to 4 weeks she is increased her dose to 75 mg daily.  She reports that her anxiety have been a little higher over the last couple months.  She reports that her son has moved back in with her in the hopes to get clean from methamphetamine.  She is trying to help her son as much as possible but with him moving back in this is increased her anxiety level.  She has noticed some slight improvement in her anxiety since increasing to 75 mg.  She also reports that her blood pressure has been slightly low at home over the last couple months.  She reports that when she checks her blood pressure is in the 120-140 systolic range.  Review of Systems See HPI   Past Medical History:  Diagnosis Date   Anxiety    Depression    Heart murmur    Hyperlipidemia    RBBB    Seasonal allergies     Social History   Socioeconomic History   Marital status: Divorced    Spouse name: Not on file   Number of children: 2   Years of education: Not on file   Highest education level: Associate degree: occupational, Scientist, product/process development, or vocational program  Occupational History   Occupation: Retired  Tobacco Use   Smoking status: Never    Passive exposure: Past   Smokeless tobacco: Never  Vaping Use   Vaping Use: Never used  Substance and Sexual Activity   Alcohol use: Yes    Comment: occasionally   Drug use: No   Sexual activity: Not Currently  Other Topics Concern   Not on file  Social History Narrative   Live alone.     Patient is right-handed. She lives alone in a 1 story house, 3-4  steps to enter.    Social Determinants of Health   Financial Resource Strain: Low Risk  (09/27/2022)   Overall Financial Resource Strain (CARDIA)    Difficulty of Paying Living Expenses: Not hard at all  Food Insecurity: No Food Insecurity (09/27/2022)   Hunger Vital Sign    Worried About Running Out of Food in the Last Year: Never true    Ran Out of Food in the Last Year: Never true  Transportation Needs: No Transportation Needs (09/27/2022)   PRAPARE - Administrator, Civil Service (Medical): No    Lack of Transportation (Non-Medical): No  Physical Activity: Insufficiently Active (09/27/2022)   Exercise Vital Sign    Days of Exercise per Week: 2 days    Minutes of Exercise per Session: 60 min  Stress: No Stress Concern Present (09/27/2022)   [employer] of Occupational Health - Occupational Stress Questionnaire    Feeling of Stress : Not at all  Social Connections: Socially Isolated (09/27/2022)   Social Connection and Isolation Panel [NHANES]    Frequency of Communication with Friends and Family: More than three times a week    Frequency of Social Gatherings with Friends and Family: More than three  times a week    Attends Religious Services: Never    Active Member of Clubs or Organizations: No    Attends Banker Meetings: Never    Marital Status: Divorced  Catering manager Violence: Not At Risk (09/27/2022)   Humiliation, Afraid, Rape, and Kick questionnaire    Fear of Current or Ex-Partner: No    Emotionally Abused: No    Physically Abused: No    Sexually Abused: No    Past Surgical History:  Procedure Laterality Date   BREAST BIOPSY Right 10/2020   x2   BREAST EXCISIONAL BIOPSY Left    COLONOSCOPY  07/07/2001   MASTECTOMY Right 11/2020   MASTECTOMY W/ SENTINEL NODE BIOPSY Right 11/28/2020   Procedure: RIGHT MASTECTOMY WITH RIGHT AXILLARY SENTINEL LYMPH NODE BIOPSY;  Surgeon: Emelia Loron, MD;  Location: [facility];   Service: General;  Laterality: Right;  RNFA, PEC BLOCK   SKIN SURGERY     DERM - nasal    TOOTH EXTRACTION     TUBAL LIGATION      Family History  Problem Relation Age of Onset   Breast cancer Mother 67   COPD Mother    Diabetes Father    Cancer Father        Brain   Heart attack Father 26   Alcohol abuse Father    Depression Father    Uterine cancer Maternal Aunt    Hearing loss Maternal Grandmother    Hypertension Maternal Grandfather    Kidney disease Maternal Grandfather    Stroke Maternal Grandfather    High Cholesterol Brother    Hypertension Brother    Hyperlipidemia Brother    Autoimmune disease Daughter    Ankylosing spondylitis Daughter    Healthy Son    Depression Son     Allergies  Allergen Reactions   Penicillins Rash   Tetracyclines & Related Palpitations    Current Outpatient Medications on File Prior to Visit  Medication Sig Dispense Refill   albuterol (VENTOLIN HFA) 108 (90 Base) MCG/ACT inhaler TAKE 2 PUFFS BY MOUTH EVERY 6 HOURS AS NEEDED FOR WHEEZE OR SHORTNESS OF BREATH 18 each 0   anastrozole (ARIMIDEX) 1 MG tablet TAKE 1 TABLET BY MOUTH EVERY DAY 90 tablet 2   Ascorbic Acid (VITAMIN C) 1000 MG tablet Take 1,000 mg by mouth daily.     CALCIUM CARBONATE-VITAMIN D PO Take 2 tablets by mouth daily.     Cholecalciferol (VITAMIN D3) 2000 UNITS TABS Take 5,000 Units by mouth daily.      glucosamine-chondroitin 500-400 MG tablet Take 1 tablet by mouth 2 (two) times daily.      ibuprofen (ADVIL) 200 MG tablet Take 200 mg by mouth every 6 (six) hours as needed. 2 tabs daily prn     Omega-3 Fatty Acids (EQL OMEGA 3 FISH OIL) 1000 MG CPDR Take 2 capsules by mouth daily.     sertraline (ZOLOFT) 50 MG tablet TAKE 1 TABLET BY MOUTH EVERY DAY (Patient taking differently: in the morning and at bedtime.) 90 tablet 1   simvastatin (ZOCOR) 10 MG tablet Take one tablet at night daily. 90 tablet 3   No current facility-administered medications on file prior to  visit.    BP 138/82 (BP Location: Left Arm, Patient Position: Sitting, Cuff Size: Normal)   Pulse 71   Temp 98.2 F (36.8 C) (Oral)   Ht 5' 9.5" (1.765 m)   Wt 169 lb 12.8 oz (77 kg)   SpO2 96%  BMI 24.72 kg/m       Objective:   Physical Exam Vitals and nursing note reviewed.  Constitutional:      Appearance: Normal appearance.  Cardiovascular:     Rate and Rhythm: Normal rate and regular rhythm.     Pulses: Normal pulses.     Heart sounds: Normal heart sounds.  Pulmonary:     Effort: Pulmonary effort is normal.     Breath sounds: Normal breath sounds.  Musculoskeletal:        General: Normal range of motion.  Skin:    General: Skin is warm and dry.  Neurological:     General: No focal deficit present.     Mental Status: She is alert and oriented to person, place, and time.  Psychiatric:        Mood and Affect: Mood normal.        Behavior: Behavior normal.        Thought Content: Thought content normal.        Judgment: Judgment normal.       Assessment & Plan:   1. Anxiety    03/11/2023   10:47 AM 11/18/2019   10:47 AM 07/13/2019    9:37 AM  GAD 7 : Generalized Anxiety Score  Nervous, Anxious, on Edge 3 1 0  Control/stop worrying 3 3 0  Worry too much - different things 3 3 1   Trouble relaxing 1 0 0  Restless 1 0 0  Easily annoyed or irritable 1 0 0  Afraid - awful might happen 1 0 0  Total GAD 7 Score 13 7 1   Anxiety Difficulty Somewhat difficult Somewhat difficult   - Will increase Zoloft to 100 mg daily.  - Follow up with PCP in 3-4 weeks to see how she is doing  - sertraline (ZOLOFT) 100 MG tablet; Take 1 tablet (100 mg total) by mouth daily.  Dispense: 90 tablet; Refill: 0  2. Depression, recurrent (HCC) Flowsheet Row Office Visit from 03/11/2023 in [facility] Total Score 10       - sertraline (ZOLOFT) 100 MG tablet; Take 1 tablet (100 mg total) by mouth daily.  Dispense: 90 tablet; Refill: 0 et (100  mg total) by mouth daily.  Dispense: 90 tablet; Refill: 0  3. Elevated blood pressure reading - at goal in office today. Continue to monitor at home  - Hopefully once anxiety improves so will blood pressure      Shirline Frees, NP  Time spent with patient today was 32 minutes which consisted of chart review, discussing anxiety/depression and elevated blood pressure readings,  work up, treatment answering questions and documentation.

## 2023-03-24 ENCOUNTER — Other Ambulatory Visit: Payer: Self-pay | Admitting: *Deleted

## 2023-03-24 DIAGNOSIS — Z170 Estrogen receptor positive status [ER+]: Secondary | ICD-10-CM

## 2023-03-24 NOTE — Progress Notes (Signed)
Signatera renewal orders placed and faxed to [phone].

## 2023-03-26 DIAGNOSIS — H524 Presbyopia: Secondary | ICD-10-CM | POA: Diagnosis not present

## 2023-03-26 DIAGNOSIS — H25813 Combined forms of age-related cataract, bilateral: Secondary | ICD-10-CM | POA: Diagnosis not present

## 2023-03-26 DIAGNOSIS — H53041 Amblyopia suspect, right eye: Secondary | ICD-10-CM | POA: Diagnosis not present

## 2023-03-26 DIAGNOSIS — H5203 Hypermetropia, bilateral: Secondary | ICD-10-CM | POA: Diagnosis not present

## 2023-03-26 DIAGNOSIS — H52223 Regular astigmatism, bilateral: Secondary | ICD-10-CM | POA: Diagnosis not present

## 2023-03-26 DIAGNOSIS — H35033 Hypertensive retinopathy, bilateral: Secondary | ICD-10-CM | POA: Diagnosis not present

## 2023-04-17 ENCOUNTER — Other Ambulatory Visit: Payer: Self-pay | Admitting: Family Medicine

## 2023-04-17 DIAGNOSIS — F339 Major depressive disorder, recurrent, unspecified: Secondary | ICD-10-CM

## 2023-04-17 DIAGNOSIS — F419 Anxiety disorder, unspecified: Secondary | ICD-10-CM

## 2023-04-17 LAB — SIGNATERA
SIGNATERA MTM READOUT: 0 MTM/ml
SIGNATERA TEST RESULT: NEGATIVE

## 2023-04-21 ENCOUNTER — Other Ambulatory Visit: Payer: Self-pay | Admitting: Adult Health

## 2023-04-21 DIAGNOSIS — F339 Major depressive disorder, recurrent, unspecified: Secondary | ICD-10-CM

## 2023-04-21 DIAGNOSIS — F419 Anxiety disorder, unspecified: Secondary | ICD-10-CM

## 2023-04-23 ENCOUNTER — Telehealth: Payer: Self-pay

## 2023-04-23 NOTE — Telephone Encounter (Signed)
Called pt per MD to advise [facility] testing was negative/not detected. Pt verbalized understanding of results and knows [facility] will be in touch to schedule 3 mo repeat lab.   

## 2023-04-29 DIAGNOSIS — H25811 Combined forms of age-related cataract, right eye: Secondary | ICD-10-CM | POA: Diagnosis not present

## 2023-05-05 ENCOUNTER — Telehealth: Payer: Self-pay

## 2023-05-05 NOTE — Telephone Encounter (Signed)
Returned Pt's call regarding hot flashes. Pt states she is now experiencing hot flashes. Pt has been on anastrozole since 2022. Advised Pt to hold rx for 2 weeks and follow up appt will be made with NP. Pt verbalized understanding.

## 2023-05-19 ENCOUNTER — Inpatient Hospital Stay: Payer: Medicare HMO | Attending: Adult Health | Admitting: Adult Health

## 2023-05-19 DIAGNOSIS — Z170 Estrogen receptor positive status [ER+]: Secondary | ICD-10-CM | POA: Diagnosis not present

## 2023-05-19 DIAGNOSIS — C50411 Malignant neoplasm of upper-outer quadrant of right female breast: Secondary | ICD-10-CM | POA: Diagnosis not present

## 2023-05-19 NOTE — Progress Notes (Signed)
[facility] Cancer Follow up:    Kristian Covey, MD [address] [place] [address]   DIAGNOSIS:  Cancer Staging  Malignant neoplasm of upper-outer quadrant of right breast in female, estrogen receptor positive (HCC) Staging form: Breast, AJCC 8th Edition - Clinical stage from 11/10/2020: Stage IA (cT1b, cN0, cM0, G2, ER+, PR+, HER2-) - Signed by Loa Socks, NP on 04/10/2021 Histologic grading system: 3 grade system - Pathologic stage from 11/28/2020: Stage IA (pT1c, pN0, cM0, G2, ER+, PR+, HER2-) - Signed by Loa Socks, NP on 04/10/2021 Stage prefix: Initial diagnosis Histologic grading system: 3 grade system  I connected with Shelbie Hutching on 05/23/23 at  3:45 PM EDT by telephone and verified that I am speaking with the correct person using two identifiers.  I discussed the limitations, risks, security and privacy concerns of performing an evaluation and management service by telephone and the availability of in person appointments.  I also discussed with the patient that there may be a patient responsible charge related to this service. The patient expressed understanding and agreed to proceed.    SUMMARY OF ONCOLOGIC HISTORY: Oncology History  Malignant neoplasm of upper-outer quadrant of right breast in female, estrogen receptor positive (HCC)  11/10/2020 Initial Diagnosis   Screening mammogram showed calcifications and a possible mass in the right breast. Diagnostic mammogram and US showed in the right breast, a 0.9cm cyst at the 8 o'clock position, a 1.0cm lesion at the 12 o'clock position, and no right axillary adenopathy. 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   11/10/2020 Cancer Staging   Staging form: Breast, AJCC 8th Edition - Clinical stage from 11/10/2020: Stage IA (cT1b, cN0, cM0, G2, ER+, PR+, HER2-) - Signed by Loa Socks, NP on  04/10/2021 Histologic grading system: 3 grade system   11/28/2020 Surgery   Right mastectomy Dwain Sarna): invasive and in situ lobular carcinoma, grade 2, 1.1cm, invasive and in situ ductal carcinoma, grade 1, 0.2cm, clear margins, 2 right axillary lymph nodes negative for carcinoma.   11/28/2020 Cancer Staging   Staging form: Breast, AJCC 8th Edition - Pathologic stage from 11/28/2020: Stage IA (pT1c, pN0, cM0, G2, ER+, PR+, HER2-) - Signed by Loa Socks, NP on 04/10/2021 Stage prefix: Initial diagnosis Histologic grading system: 3 grade system   12/2020 -  Anti-estrogen oral therapy   Anastrozole daily     CURRENT THERAPY:anastrozole  INTERVAL HISTORY: Damisha Heiden 77 y.o. female returns for f/u to discuss hot flashes.    Follow up to discuss hot flashes.  These are worse in the morning, occasional throughout the day, then they occur at night.  These started about 1-2 months ago.  They haven't stopped and since she has been on the anastrozole since 12/2020.  She stopped anastrozole x 2 weeks, and the hot flashes have begun to improve slightly.    Patient Active Problem List   Diagnosis Date Noted   Breast cancer, right (HCC) 11/28/2020   Malignant neoplasm of upper-outer quadrant of right breast in female, estrogen receptor positive (HCC) 11/10/2020   GERD (gastroesophageal reflux disease) 04/03/2020   Osteoarthritis of multiple joints 04/03/2020   Viral warts 07/13/2019   Aortic atherosclerosis (HCC) 12/03/2018   Dyslipidemia 12/03/2018   Precordial chest pain 07/07/2015   Pain in joint of right foot 05/16/2014   Vitamin D deficiency 07/08/2013   Mitral valve prolapse syndrome, history of mild  01/05/2013   Seasonal allergies    Anxiety    Depression, recurrent (HCC)    Hyperlipidemia     is allergic to penicillins and tetracyclines & related.  MEDICAL HISTORY: Past Medical History:  Diagnosis Date   Anxiety    Depression    Heart murmur     Hyperlipidemia    RBBB    Seasonal allergies     SURGICAL HISTORY: Past Surgical History:  Procedure Laterality Date   BREAST BIOPSY Right 10/2020   x2   BREAST EXCISIONAL BIOPSY Left    COLONOSCOPY  07/07/2001   MASTECTOMY Right 11/2020   MASTECTOMY W/ SENTINEL NODE BIOPSY Right 11/28/2020   Procedure: RIGHT MASTECTOMY WITH RIGHT AXILLARY SENTINEL LYMPH NODE BIOPSY;  Surgeon: Emelia Loron, MD;  Location: [facility];  Service: General;  Laterality: Right;  RNFA, PEC BLOCK   SKIN SURGERY     DERM - nasal    TOOTH EXTRACTION     TUBAL LIGATION      SOCIAL HISTORY: Social History   Socioeconomic History   Marital status: Divorced    Spouse name: Not on file   Number of children: 2   Years of education: Not on file   Highest education level: Associate degree: occupational, Scientist, product/process development, or vocational program  Occupational History   Occupation: Retired  Tobacco Use   Smoking status: Never    Passive exposure: Past   Smokeless tobacco: Never  Vaping Use   Vaping status: Never Used  Substance and Sexual Activity   Alcohol use: Yes    Comment: occasionally   Drug use: No   Sexual activity: Not Currently  Other Topics Concern   Not on file  Social History Narrative   Live alone.     Patient is right-handed. She lives alone in a 1 story house, 3-4 steps to enter.    Social Determinants of Health   Financial Resource Strain: Low Risk  (09/27/2022)   Overall Financial Resource Strain (CARDIA)    Difficulty of Paying Living Expenses: Not hard at all  Food Insecurity: No Food Insecurity (09/27/2022)   Hunger Vital Sign    Worried About Running Out of Food in the Last Year: Never true    Ran Out of Food in the Last Year: Never true  Transportation Needs: No Transportation Needs (09/27/2022)   PRAPARE - Administrator, Civil Service (Medical): No    Lack of Transportation (Non-Medical): No  Physical Activity: Insufficiently Active  (09/27/2022)   Exercise Vital Sign    Days of Exercise per Week: 2 days    Minutes of Exercise per Session: 60 min  Stress: No Stress Concern Present (09/27/2022)   [employer] of Occupational Health - Occupational Stress Questionnaire    Feeling of Stress : Not at all  Social Connections: Socially Isolated (09/27/2022)   Social Connection and Isolation Panel [NHANES]    Frequency of Communication with Friends and Family: More than three times a week    Frequency of Social Gatherings with Friends and Family: More than three times a week    Attends Religious Services: Never    Database administrator or Organizations: No    Attends Banker Meetings: Never    Marital Status: Divorced  Catering manager Violence: Not At Risk (09/27/2022)   Humiliation, Afraid, Rape, and Kick questionnaire    Fear of Current or Ex-Partner: No    Emotionally Abused: No    Physically Abused: No  Sexually Abused: No    FAMILY HISTORY: Family History  Problem Relation Age of Onset   Breast cancer Mother 32   COPD Mother    Diabetes Father    Cancer Father        Brain   Heart attack Father 83   Alcohol abuse Father    Depression Father    Uterine cancer Maternal Aunt    Hearing loss Maternal Grandmother    Hypertension Maternal Grandfather    Kidney disease Maternal Grandfather    Stroke Maternal Grandfather    High Cholesterol Brother    Hypertension Brother    Hyperlipidemia Brother    Autoimmune disease Daughter    Ankylosing spondylitis Daughter    Healthy Son    Depression Son     Review of Systems  Constitutional:  Negative for appetite change, chills, fatigue, fever and unexpected weight change.  HENT:   Negative for hearing loss, lump/mass and trouble swallowing.   Eyes:  Negative for eye problems and icterus.  Respiratory:  Negative for chest tightness, cough and shortness of breath.   Cardiovascular:  Negative for chest pain, leg swelling and palpitations.   Gastrointestinal:  Negative for abdominal distention, abdominal pain, constipation, diarrhea, nausea and vomiting.  Endocrine: Positive for hot flashes.  Genitourinary:  Negative for difficulty urinating.   Musculoskeletal:  Negative for arthralgias.  Skin:  Negative for itching and rash.  Neurological:  Negative for dizziness, extremity weakness, headaches and numbness.  Hematological:  Negative for adenopathy. Does not bruise/bleed easily.  Psychiatric/Behavioral:  Negative for depression. The patient is not nervous/anxious.       PHYSICAL EXAMINATION  Patient sounds well.  She is in no apparent distress.    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  Margie and I discussed her hot flashes.  It is somewhat unusual that these are happening more so over the past few weeks and none prior.  I suggested she consider changing the time of day that she takes the anastrozole to see if that helps.  We can also consider switching therapies.  It also could be related to the heat.  She is going to try that and let me know how it goes.  RTC in 12/2023 with Dr. Pamelia Hoit.   Follow up instructions:    -Return to cancer center 12/2023 for f/u with Dr. Pamelia Hoit   The patient was provided an opportunity to ask questions and all were answered. The patient agreed with the plan and demonstrated an understanding of the instructions.   The patient was advised to call back or seek an in-person evaluation if the symptoms worsen or if the condition fails to improve as anticipated.   I provided 10 minutes of non face-to-face telephone visit time during this encounter, and > 50% was spent  counseling as documented under my assessment & plan.   Lillard Anes, NP 05/23/23 9:07 AM Medical Oncology and Hematology [facility] [address] [place], [place] [address] Tel. [phone]    Fax. [phone]  *Total Encounter Time as defined by the Centers for Medicare and Medicaid Services includes, in addition to the face-to-face time of a patient visit (documented in the note above) non-face-to-face time: obtaining and reviewing  outside history, ordering and reviewing medications, tests or procedures, care coordination (communications with other health care professionals or caregivers) and documentation in the medical record.

## 2023-05-23 NOTE — Assessment & Plan Note (Signed)
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  Kelly George and I discussed her hot flashes.  It is somewhat unusual that these are happening more so over the past few weeks and none prior.  I suggested she consider changing the time of day that she takes the anastrozole to see if that helps.  We can also consider switching therapies.  It also could be related to the heat.  She is going to try that and let me know how it goes.  RTC in 12/2023 with Dr. Pamelia Hoit.

## 2023-05-27 DIAGNOSIS — H268 Other specified cataract: Secondary | ICD-10-CM | POA: Diagnosis not present

## 2023-05-27 DIAGNOSIS — H25811 Combined forms of age-related cataract, right eye: Secondary | ICD-10-CM | POA: Diagnosis not present

## 2023-06-06 ENCOUNTER — Telehealth: Payer: Self-pay

## 2023-06-06 NOTE — Telephone Encounter (Signed)
Pt reports she is having sx on her eyes in [place] and asked for advice holding her AI therapy. Advised per MD hold 2 weeks prior to sx then resume one week after. She verbalized thanks and understanding.

## 2023-06-06 NOTE — Telephone Encounter (Signed)
Pt called and LVM asking for advice on restarting anastrozole after using eye drops. Attempted to call pt and LVM for call back.

## 2023-06-29 ENCOUNTER — Other Ambulatory Visit: Payer: Self-pay | Admitting: Adult Health

## 2023-06-29 DIAGNOSIS — F339 Major depressive disorder, recurrent, unspecified: Secondary | ICD-10-CM

## 2023-06-29 DIAGNOSIS — F419 Anxiety disorder, unspecified: Secondary | ICD-10-CM

## 2023-07-01 DIAGNOSIS — H25812 Combined forms of age-related cataract, left eye: Secondary | ICD-10-CM | POA: Diagnosis not present

## 2023-07-02 ENCOUNTER — Other Ambulatory Visit: Payer: Self-pay | Admitting: Family Medicine

## 2023-07-02 ENCOUNTER — Other Ambulatory Visit: Payer: Self-pay | Admitting: Adult Health

## 2023-07-02 DIAGNOSIS — E7800 Pure hypercholesterolemia, unspecified: Secondary | ICD-10-CM

## 2023-07-02 DIAGNOSIS — F339 Major depressive disorder, recurrent, unspecified: Secondary | ICD-10-CM

## 2023-07-02 DIAGNOSIS — F419 Anxiety disorder, unspecified: Secondary | ICD-10-CM

## 2023-07-03 NOTE — Telephone Encounter (Signed)
Pt called to say she tried the Expired - sertraline (ZOLOFT) 100 MG tablets, and says it has been working very well for her and she would like to keep taking it.   Please send refill to: [id number] - [place], [place] - [address] AT [place] [address] Phone: [phone]  Fax: [phone]

## 2023-07-04 MED ORDER — SERTRALINE HCL 100 MG PO TABS: 100.0000 mg | ORAL_TABLET | Freq: Every day | ORAL | 1 refills | Status: DC

## 2023-07-04 NOTE — Telephone Encounter (Signed)
Rx sent 

## 2023-07-04 NOTE — Addendum Note (Signed)
Addended by: Christy Sartorius on: 07/04/2023 01:05 PM   Modules accepted: Orders

## 2023-07-04 NOTE — Telephone Encounter (Signed)
Patient informed of message below and voiced understanding

## 2023-07-11 DIAGNOSIS — Z170 Estrogen receptor positive status [ER+]: Secondary | ICD-10-CM | POA: Diagnosis not present

## 2023-07-11 DIAGNOSIS — C50411 Malignant neoplasm of upper-outer quadrant of right female breast: Secondary | ICD-10-CM | POA: Diagnosis not present

## 2023-07-15 DIAGNOSIS — H25812 Combined forms of age-related cataract, left eye: Secondary | ICD-10-CM | POA: Diagnosis not present

## 2023-07-15 DIAGNOSIS — H268 Other specified cataract: Secondary | ICD-10-CM | POA: Diagnosis not present

## 2023-07-19 LAB — SIGNATERA ONLY (NATERA MANAGED)
SIGNATERA MTM READOUT: 0 MTM/ml
SIGNATERA TEST RESULT: NEGATIVE

## 2023-07-30 ENCOUNTER — Telehealth: Payer: Self-pay

## 2023-07-30 NOTE — Telephone Encounter (Signed)
Called pt per MD to advise Signatera testing was negative/not detected. Pt verbalized understanding of results and knows [facility] will be in touch to schedule 3 mo repeat lab.

## 2023-08-11 ENCOUNTER — Encounter: Payer: Self-pay | Admitting: Family Medicine

## 2023-08-11 ENCOUNTER — Ambulatory Visit (INDEPENDENT_AMBULATORY_CARE_PROVIDER_SITE_OTHER): Payer: Medicare HMO | Admitting: Family Medicine

## 2023-08-11 VITALS — BP 140/80 | HR 62 | Temp 98.4°F | Ht 66.93 in | Wt 169.9 lb

## 2023-08-11 DIAGNOSIS — Z853 Personal history of malignant neoplasm of breast: Secondary | ICD-10-CM | POA: Diagnosis not present

## 2023-08-11 DIAGNOSIS — Z0000 Encounter for general adult medical examination without abnormal findings: Secondary | ICD-10-CM | POA: Diagnosis not present

## 2023-08-11 DIAGNOSIS — E785 Hyperlipidemia, unspecified: Secondary | ICD-10-CM

## 2023-08-11 DIAGNOSIS — F339 Major depressive disorder, recurrent, unspecified: Secondary | ICD-10-CM

## 2023-08-11 LAB — COMPREHENSIVE METABOLIC PANEL
ALT: 25 U/L (ref 0–35)
AST: 26 U/L (ref 0–37)
Albumin: 4.7 g/dL (ref 3.5–5.2)
Alkaline Phosphatase: 78 U/L (ref 39–117)
BUN: 20 mg/dL (ref 6–23)
CO2: 30 meq/L (ref 19–32)
Calcium: 10.4 mg/dL (ref 8.4–10.5)
Chloride: 100 meq/L (ref 96–112)
Creatinine, Ser: 0.63 mg/dL (ref 0.40–1.20)
GFR: 84.51 mL/min (ref >60.00)
Glucose, Bld: 91 mg/dL (ref 70–99)
Potassium: 4.1 meq/L (ref 3.5–5.1)
Sodium: 139 meq/L (ref 135–145)
Total Bilirubin: 1 mg/dL (ref 0.2–1.2)
Total Protein: 7.2 g/dL (ref 6.0–8.3)

## 2023-08-11 LAB — CBC WITH DIFFERENTIAL/PLATELET
Basophils Absolute: 0 10*3/uL (ref 0.0–0.1)
Basophils Relative: 0.7 % (ref 0.0–3.0)
Eosinophils Absolute: 0.1 10*3/uL (ref 0.0–0.7)
Eosinophils Relative: 2.4 % (ref 0.0–5.0)
HCT: 43.9 % (ref 36.0–46.0)
Hemoglobin: 14.2 g/dL (ref 12.0–15.0)
Lymphocytes Relative: 25.3 % (ref 12.0–46.0)
Lymphs Abs: 1.1 10*3/uL (ref 0.7–4.0)
MCHC: 32.3 g/dL (ref 30.0–36.0)
MCV: 96.9 fL (ref 78.0–100.0)
Monocytes Absolute: 0.5 10*3/uL (ref 0.1–1.0)
Monocytes Relative: 11.3 % (ref 3.0–12.0)
Neutro Abs: 2.6 10*3/uL (ref 1.4–7.7)
Neutrophils Relative %: 60.3 % (ref 43.0–77.0)
Platelets: 226 10*3/uL (ref 150.0–400.0)
RBC: 4.53 Mil/uL (ref 3.87–5.11)
RDW: 13.6 % (ref 11.5–15.5)
WBC: 4.2 10*3/uL (ref 4.0–10.5)

## 2023-08-11 LAB — LIPID PANEL
Cholesterol: 189 mg/dL (ref 0–200)
HDL: 87.3 mg/dL (ref >39.00)
LDL Cholesterol: 87 mg/dL (ref 0–99)
NonHDL: 102.1
Total CHOL/HDL Ratio: 2
Triglycerides: 74 mg/dL (ref 0.0–149.0)
VLDL: 14.8 mg/dL (ref 0.0–40.0)

## 2023-08-11 NOTE — Progress Notes (Signed)
Established Patient Office Visit  Subjective   Patient ID: Kelly George, female    DOB: Jun 04, 1946  Age: 77 y.o. MRN: [id number]  Chief Complaint  Patient presents with   Annual Exam    HPI   Kelly George is seen today for physical exam.  She has history of breast cancer and remains on Arimidex.  Other medical problems include history of mitral valve prolapse, GERD, osteoarthritis involving multiple joints, history of recurrent depression, dyslipidemia.  She has had some stress this past year dealing with son who is 48 who has recently come back to live with her.  Her son has history of drug addiction.  This is naturally stressed to her that she had difficulty sleeping recently.  She has had cataract surgeries in both eyes.  She is currently on sertraline 100 mg daily for depression and feels like that dose is working fairly well.  She takes simvastatin 10 mg daily for hyperlipidemia.  Denies any significant myalgias.  No recent chest pains.  Health maintenance reviewed  -She had DEXA scan recently that looked great -Pneumonia vaccines complete -Flu vaccine already received -Shingrix complete -Aged out of further colonoscopies -Mammogram up-to-date  Social history-she is divorced.  Has a son currently living with her.  She is non-smoker.  No regular alcohol.  Family history-reviewed without significant change  Past Medical History:  Diagnosis Date   Anxiety    Depression    Heart murmur    Hyperlipidemia    RBBB    Seasonal allergies    Past Surgical History:  Procedure Laterality Date   BREAST BIOPSY Right 10/2020   x2   BREAST EXCISIONAL BIOPSY Left    COLONOSCOPY  07/07/2001   MASTECTOMY Right 11/2020   MASTECTOMY W/ SENTINEL NODE BIOPSY Right 11/28/2020   Procedure: RIGHT MASTECTOMY WITH RIGHT AXILLARY SENTINEL LYMPH NODE BIOPSY;  Surgeon: Emelia Loron, George;  Location:  S[facility];  Service: General;  Laterality: Right;  RNFA, PEC BLOCK    SKIN SURGERY     DERM - nasal    TOOTH EXTRACTION     TUBAL LIGATION      reports that she has never smoked. She has been exposed to tobacco smoke. She has never used smokeless tobacco. She reports current alcohol use. She reports that she does not use drugs. family history includes Alcohol abuse in her father; Ankylosing spondylitis in her daughter; Autoimmune disease in her daughter; Breast cancer (age of onset: 43) in her mother; COPD in her mother; Cancer in her father; Depression in her father and son; Diabetes in her father; Healthy in her son; Hearing loss in her maternal grandmother; Heart attack (age of onset: 7) in her father; High Cholesterol in her brother; Hyperlipidemia in her brother; Hypertension in her brother and maternal grandfather; Kidney disease in her maternal grandfather; Stroke in her maternal grandfather; Uterine cancer in her maternal aunt. Allergies  Allergen Reactions   Penicillins Rash   Tetracyclines & Related Palpitations    Review of Systems  Constitutional:  Negative for malaise/fatigue.  Eyes:  Negative for blurred vision.  Respiratory:  Negative for shortness of breath.   Cardiovascular:  Negative for chest pain.  Neurological:  Negative for dizziness, weakness and headaches.  Psychiatric/Behavioral:  The patient has insomnia.       Objective:     BP (!) 140/80 (BP Location: Left Arm, Patient Position: Sitting, Cuff Size: Normal)   Pulse 62   Temp 98.4 F (36.9 C) (Oral)  Ht 5' 6.93" (1.7 m)   Wt 169 lb 14.4 oz (77.1 kg)   SpO2 98%   BMI 26.67 kg/m  BP Readings from Last 3 Encounters:  08/11/23 (!) 140/80  03/11/23 138/82  01/02/23 (!) 148/79   Wt Readings from Last 3 Encounters:  08/11/23 169 lb 14.4 oz (77.1 kg)  03/11/23 169 lb 12.8 oz (77 kg)  01/02/23 171 lb 3.2 oz (77.7 kg)      Physical Exam Vitals reviewed.  Constitutional:      Appearance: She is well-developed.  Eyes:     Pupils: Pupils are equal, round, and reactive  to light.  Neck:     Thyroid: No thyromegaly.     Vascular: No JVD.  Cardiovascular:     Rate and Rhythm: Normal rate and regular rhythm.     Heart sounds:     No gallop.  Pulmonary:     Effort: Pulmonary effort is normal. No respiratory distress.     Breath sounds: Normal breath sounds. No wheezing or rales.  Musculoskeletal:     Cervical back: Neck supple.     Right lower leg: No edema.     Left lower leg: No edema.  Neurological:     Mental Status: She is alert.      No results found for any visits on 08/11/23.  Last CBC Lab Results  Component Value Date   WBC 4.0 11/18/2019   HGB 14.6 11/18/2019   HCT 43.4 11/18/2019   MCV 95 11/18/2019   MCH 31.9 11/18/2019   RDW 12.2 11/18/2019   PLT 262 11/18/2019   Last metabolic panel Lab Results  Component Value Date   GLUCOSE 97 07/30/2022   NA 139 07/30/2022   K 4.5 07/30/2022   CL 102 07/30/2022   CO2 32 07/30/2022   BUN 18 07/30/2022   CREATININE 0.56 07/30/2022   GFR 87.58 07/30/2022   CALCIUM 10.3 07/30/2022   PROT 7.6 07/30/2022   ALBUMIN 4.7 07/30/2022   LABGLOB 2.5 11/18/2019   AGRATIO 1.8 11/18/2019   BILITOT 0.8 07/30/2022   ALKPHOS 66 07/30/2022   AST 28 07/30/2022   ALT 30 07/30/2022   Last lipids Lab Results  Component Value Date   CHOL 172 07/30/2022   HDL 81.10 07/30/2022   LDLCALC 80 07/30/2022   TRIG 52.0 07/30/2022   CHOLHDL 2 07/30/2022      The 32-GMWN ASCVD risk score (Arnett DK, et al., 2019) is: 30%    Assessment & Plan:   Physical exam.  77 year old female with medical history as above.  Generally doing well.  We discussed the following health maintenance items today  -Reviewed previous DEXA scan from 2022 which was excellent -Flu vaccine already given -Pneumonia vaccines complete -Mammogram up-to-date -Aged out of further colonoscopy -Discussed importance of regular exercise -Needs follow-up lipids and CMP with her simvastatin therapy.  This will be drawn today.   No  follow-ups on file.    Kelly George

## 2023-08-24 ENCOUNTER — Other Ambulatory Visit: Payer: Self-pay

## 2023-08-24 ENCOUNTER — Emergency Department (HOSPITAL_COMMUNITY)
Admission: EM | Admit: 2023-08-24 | Discharge: 2023-08-24 | Disposition: A | Discharge status: Home / Self Care | Payer: Medicare HMO | Attending: Emergency Medicine | Admitting: Emergency Medicine

## 2023-08-24 ENCOUNTER — Emergency Department (HOSPITAL_COMMUNITY): Payer: Medicare HMO

## 2023-08-24 ENCOUNTER — Encounter (HOSPITAL_COMMUNITY): Payer: Self-pay

## 2023-08-24 DIAGNOSIS — M47817 Spondylosis without myelopathy or radiculopathy, lumbosacral region: Secondary | ICD-10-CM | POA: Diagnosis not present

## 2023-08-24 DIAGNOSIS — M5442 Lumbago with sciatica, left side: Secondary | ICD-10-CM | POA: Diagnosis not present

## 2023-08-24 DIAGNOSIS — S3992XA Unspecified injury of lower back, initial encounter: Secondary | ICD-10-CM | POA: Diagnosis present

## 2023-08-24 DIAGNOSIS — M48061 Spinal stenosis, lumbar region without neurogenic claudication: Secondary | ICD-10-CM | POA: Diagnosis not present

## 2023-08-24 DIAGNOSIS — M438X6 Other specified deforming dorsopathies, lumbar region: Secondary | ICD-10-CM | POA: Diagnosis not present

## 2023-08-24 DIAGNOSIS — X58XXXA Exposure to other specified factors, initial encounter: Secondary | ICD-10-CM | POA: Diagnosis not present

## 2023-08-24 DIAGNOSIS — Z853 Personal history of malignant neoplasm of breast: Secondary | ICD-10-CM | POA: Diagnosis not present

## 2023-08-24 DIAGNOSIS — M5126 Other intervertebral disc displacement, lumbar region: Secondary | ICD-10-CM | POA: Diagnosis not present

## 2023-08-24 DIAGNOSIS — S32050A Wedge compression fracture of fifth lumbar vertebra, initial encounter for closed fracture: Secondary | ICD-10-CM | POA: Diagnosis not present

## 2023-08-24 DIAGNOSIS — M545 Low back pain, unspecified: Secondary | ICD-10-CM | POA: Diagnosis not present

## 2023-08-24 DIAGNOSIS — M5441 Lumbago with sciatica, right side: Secondary | ICD-10-CM | POA: Insufficient documentation

## 2023-08-24 DIAGNOSIS — M47815 Spondylosis without myelopathy or radiculopathy, thoracolumbar region: Secondary | ICD-10-CM | POA: Diagnosis not present

## 2023-08-24 DIAGNOSIS — M47816 Spondylosis without myelopathy or radiculopathy, lumbar region: Secondary | ICD-10-CM | POA: Diagnosis not present

## 2023-08-24 MED ORDER — OXYCODONE-ACETAMINOPHEN 5-325 MG PO TABS
1.0000 | ORAL_TABLET | Freq: Once | ORAL | Status: AC
  Administered 2023-08-24: 1 via ORAL
  Filled 2023-08-24: qty 1

## 2023-08-24 MED ORDER — OXYCODONE HCL 5 MG PO TABS: 5.0000 mg | ORAL_TABLET | Freq: Four times a day (QID) | ORAL | 0 refills | Status: DC | PRN

## 2023-08-24 NOTE — ED Notes (Addendum)
Pt returned from MRI. Ortho tech called for TLSO brace.

## 2023-08-24 NOTE — ED Triage Notes (Signed)
Reports lower back pain x 1 week and reports it radiates down bilateral legs with numbness and tingling to feet.  Patient reports she was in the shower and almost blacked out due to pain and then vomited.  Denies injury or falling.

## 2023-08-24 NOTE — Progress Notes (Signed)
Orthopedic Tech Progress Note Patient Details:  Kelly George May 06, 1946 [id number]  Ortho Devices Type of Ortho Device: Thoracolumbar corset (TLSO) Ortho Device/Splint Interventions: Ordered, Application, Adjustment   Post Interventions Patient Tolerated: Well Instructions Provided: Care of device, Adjustment of device  Kelly George 08/24/2023, 3:13 PM

## 2023-08-24 NOTE — ED Notes (Signed)
Pt to MRI

## 2023-08-24 NOTE — Discharge Instructions (Addendum)
Thank you for allowing [place] to be a part of your care today.  Your MRI of your lumbar spine showed a compression fracture to L5.  You were fitted for a TLSO brace to help give support.  You may take the brace off to shower or when you go to bed.    Please call [facility] to schedule a follow-up appointment.   You may continue taking ibuprofen as needed for mild to moderate pain.  I have prescribed you a narcotic pain medicine to use for severe or breakthrough pain.  This medication can be sedating.  Use caution as to prevent falls.  Return to the ED if you develop sudden worsening of your symptoms, have sudden weakness or numbness in your legs, or if you have any new concerns.

## 2023-08-24 NOTE — ED Provider Notes (Signed)
[place] EMERGENCY DEPARTMENT AT [facility] Provider Note   CSN: [id number] Arrival date & time: 08/24/23  1103     History  Chief Complaint  Patient presents with   Back Pain    Kelly George is a 77 y.o. female with past medical history significant for breast cancer, osteoarthritis, GERD, depression, anxiety presents to the ED complaining of lower back pain for the past week.  She states the pain radiates down both of her legs.  Endorses numbness and tingling of her feet.  Patient was in the shower this morning and had a near syncopal episode and vomited due to severe pain.  She reports the pain is worst in the mornings.  She has been taking 400 mg of ibuprofen daily for "a long time" as she has chronic back pain.  She states this pain is more severe and different from her chronic problems.  Patient has urinary incontinence that is not new.  She states she is not currently having discomfort as she is lying still and not moving around.  Denies loss of bladder control, recent falls or other injury, heavy lifting, fever, weakness, headache, neck pain or stiffness.  She has not had any prior back surgeries.      Home Medications Prior to Admission medications   Medication Sig Start Date End Date Taking? Authorizing Provider  oxyCODONE (ROXICODONE) 5 MG immediate release tablet Take 1 tablet (5 mg total) by mouth every 6 (six) hours as needed for severe pain (pain score 7-10). 08/24/23  Yes Haden Cavenaugh R, PA-C  anastrozole (ARIMIDEX) 1 MG tablet TAKE 1 TABLET BY MOUTH EVERY DAY 10/28/22   Serena Croissant, MD  Ascorbic Acid (VITAMIN C) 1000 MG tablet Take 1,000 mg by mouth daily.    [provider]  CALCIUM CARBONATE-VITAMIN D PO Take 2 tablets by mouth daily.    [provider]  Cholecalciferol (VITAMIN D3) 2000 UNITS TABS Take 5,000 Units by mouth daily.     [provider]  glucosamine-chondroitin 500-400 MG tablet Take 1 tablet by mouth 2  (two) times daily.     [provider]  ibuprofen (ADVIL) 200 MG tablet Take 200 mg by mouth every 6 (six) hours as needed. 2 tabs daily prn    [provider]  Omega-3 Fatty Acids (EQL OMEGA 3 FISH OIL) 1000 MG CPDR Take 2 capsules by mouth daily.    [provider]  sertraline (ZOLOFT) 100 MG tablet Take 1 tablet (100 mg total) by mouth daily. 07/04/23 10/02/23  Burchette, Elberta Fortis, MD  simvastatin (ZOCOR) 10 MG tablet TAKE 1 TABLET DAILY AT NIGHT 07/03/23   Burchette, Elberta Fortis, MD      Allergies    Penicillins and Tetracyclines & related    Review of Systems   Review of Systems  Constitutional:  Negative for fever.  Gastrointestinal:  Positive for vomiting.  Musculoskeletal:  Positive for back pain. Negative for neck pain and neck stiffness.  Neurological:  Positive for light-headedness (near syncope) and numbness. Negative for syncope, weakness and headaches.    Physical Exam Updated Vital Signs BP 133/80 (BP Location: Left Arm)   Pulse (!) 58   Temp 97.7 F (36.5 C) (Oral)   Resp 18   Ht 5\' 7"  (1.702 m)   Wt 76.7 kg   SpO2 100%   BMI 26.47 kg/m  Physical Exam Vitals and nursing note reviewed.  Constitutional:      General: She is not in acute distress.  Appearance: Normal appearance. She is not ill-appearing or diaphoretic.  Cardiovascular:     Rate and Rhythm: Normal rate and regular rhythm.  Pulmonary:     Effort: Pulmonary effort is normal.  Skin:    General: Skin is warm and dry.     Capillary Refill: Capillary refill takes less than 2 seconds.  Neurological:     Mental Status: She is alert. Mental status is at baseline.     Sensory: Sensation is intact. No sensory deficit.     Comments: 4/5 strength in bilateral lower extremities.  5/5 strength with dorsiflexion and plantar flexion. Sensation is intact.    Psychiatric:        Mood and Affect: Mood normal.        Behavior: Behavior normal.     ED Results / Procedures / Treatments    Labs (all labs ordered are listed, but only abnormal results are displayed) Labs Reviewed - No data to display  EKG None  Radiology MR LUMBAR SPINE WO CONTRAST  Result Date: 08/24/2023 CLINICAL DATA:  Compression fracture, lumbar Low back pain, spondyloarthropathy suspected, xray done EXAM: MRI LUMBAR SPINE WITHOUT CONTRAST TECHNIQUE: Multiplanar, multisequence MR imaging of the lumbar spine was performed. No intravenous contrast was administered. COMPARISON:  None Available. FINDINGS: Segmentation:  Standard. Alignment: Trace retrolisthesis of L2 on L3 and L3 on L4. Grade 1 anterolisthesis of L4 on L5 and L5 on S1. Vertebrae: There is an acute superior endplate compression deformity at L5. No evidence of discitis. There are multilevel degenerative endplate changes throughout the lumbar spine. Conus medullaris and cauda equina: Conus extends to the L2 level. Conus and cauda equina appear normal. Paraspinal and other soft tissues: There is atrophy of the psoas musculature bilaterally. Disc levels: T12-L1: Moderate left facet degenerative change. No spinal canal narrowing. Moderate left neural foraminal narrowing. L1-L2: Mild-to-moderate bilateral facet degenerative change. Eccentric left disc bulge. Mild overall spinal canal narrowing. Moderate left and mild right neural foraminal narrowing. L2-L3: Circumferential disc bulge. Short pedicles. Ligamentum flavum hypertrophy. Moderate bilateral facet degenerative change. Moderate to severe spinal canal narrowing. Moderate bilateral neural foraminal narrowing, right-greater-than-left. L3-L4: Short pedicles. Circumferential disc bulge. Mild bilateral facet degenerative change. Ligamentum flavum hypertrophy. Moderate to severe spinal canal narrowing. Mild left and moderate right neural foraminal narrowing. L4-L5: Severe bilateral facet degenerative change. Circumferential disc bulge. Ligamentum flavum hypertrophy. Severe spinal canal narrowing. Moderate to  severe right and mild left neural foraminal narrowing. L5-S1: Severe bilateral facet degenerative change. Circumferential disc bulge. Ligamentum flavum hypertrophy. Severe spinal canal narrowing. Moderate bilateral neural foraminal narrowing, right-greater-than-left. IMPRESSION: 1. Acute superior endplate compression deformity at L5. 2. Multilevel degenerative changes of the lumbar spine with severe spinal canal narrowing at L4-L5 and L5-S1 and moderate to severe spinal canal narrowing at L2-L3 and L3-L4. 3. Multilevel neural foraminal narrowing, moderate to severe on the right at L4-L5. Electronically Signed   By: Lorenza Cambridge M.D.   On: 08/24/2023 13:56   DG Lumbar Spine Complete  Result Date: 08/24/2023 CLINICAL DATA:  Low back pain for 1 week without trauma EXAM: LUMBAR SPINE - COMPLETE 4+ VIEW COMPARISON:  11/06/2018 MRI, report only. FINDINGS: Convex left lumbar spine curvature. Sacroiliac joints are symmetric. Advanced lumbosacral spondylosis. Loss of intervertebral disc height at all lumbar levels. Facet arthropathy is most significant in the lower lumbar spine and lumbosacral junction. There may be mild superior endplate compression deformity at L5. Other vertebral body height is grossly maintained. IMPRESSION: Advanced lumbosacral spondylosis. Possible mild superior endplate  compression deformity at L5. This is of indeterminate acuity Electronically Signed   By: Jeronimo Greaves M.D.   On: 08/24/2023 12:10    Procedures Procedures    Medications Ordered in ED Medications  oxyCODONE-acetaminophen (PERCOCET/ROXICET) 5-325 MG per tablet 1 tablet (1 tablet Oral Given 08/24/23 1250)    ED Course/ Medical Decision Making/ A&P                                 Medical Decision Making Amount and/or Complexity of Data Reviewed Radiology: ordered.  Risk Prescription drug management.   This patient presents to the ED with chief complaint(s) of severe back pain, paresthesias with pertinent past  medical history of breast cancer, osteopenia, osteoarthritis.  The complaint involves an extensive differential diagnosis and also carries with it a high risk of complications and morbidity.    The differential diagnosis includes compression fracture, cauda equina, lumbar radiculopathy, myelopathy, spinal stenosis   Initial Assessment:   Exam significant for overall well appearing patient who is not in acute distress.  Sensation is intact in bilateral lower extremities.  4/5 strength bilaterally in major muscles.  5/5 strength with dorsiflexion and plantar flexion.  Independent visualization and interpretation of imaging: Lumbar x-ray ordered during triage.  I independently visualized the following imaging with scope of interpretation limited to determining acute life threatening conditions related to emergency care: lumbar x-ray, which revealed advanced lumbosacral spondylosis and possible mild superior endplate compression deformity at L5.  Will order MR lumbar spine to further assess as patient may have compression fracture, sudden change in back pain symptoms, and history of breast cancer.     MRI lumbar spine demonstrates acute superior endplate compression deformity at L5.  There is also multilevel degenerative changes of the lumbar spine with severe spinal canal narrowing.  Multilevel neural foraminal narrowing, moderate to severe on the right.    Treatment and Reassessment: Patient given oxycodone prior to MRI.  Upon reassessment, patient reports she is not currently having any pain.  Patient with acute lumbar compression fracture.  TLSO brace ordered.  Ortho tech to apply.   Disposition:   Patient provided with follow up information for [facility].  Advised patient she will need to wear the brace when she up and moving around, but may remove for showering or bedtime.  Short course of pain medicine sent to pharmacy to treat severe or breakthrough pain.    The patient  has been appropriately medically screened and/or stabilized in the ED. I have low suspicion for any other emergent medical condition which would require further screening, evaluation or treatment in the ED or require inpatient management. At time of discharge the patient is hemodynamically stable and in no acute distress. I have discussed work-up results and diagnosis with patient and answered all questions. Patient is agreeable with discharge plan. We discussed strict return precautions for returning to the emergency department and they verbalized understanding.           Final Clinical Impression(s) / ED Diagnoses Final diagnoses:  Closed compression fracture of L5 lumbar vertebra, initial encounter (HCC)  Acute bilateral low back pain with bilateral sciatica    Rx / DC Orders ED Discharge Orders          Ordered    oxyCODONE (ROXICODONE) 5 MG immediate release tablet  Every 6 hours PRN        08/24/23 1458  Lenard Simmer, PA-C 08/24/23 1506    Maia Plan, MD 08/25/23 [phone]

## 2023-08-27 ENCOUNTER — Other Ambulatory Visit: Payer: Self-pay | Admitting: *Deleted

## 2023-08-27 DIAGNOSIS — Z170 Estrogen receptor positive status [ER+]: Secondary | ICD-10-CM

## 2023-08-27 DIAGNOSIS — Z79811 Long term (current) use of aromatase inhibitors: Secondary | ICD-10-CM

## 2023-08-27 DIAGNOSIS — M8580 Other specified disorders of bone density and structure, unspecified site: Secondary | ICD-10-CM

## 2023-08-27 NOTE — Progress Notes (Signed)
Received call from pt stating she has recently been diagnosed with back pain and a compression fracture in her back.  Pt requesting advice from MD if she needs to continue Anastrozole.  Per MD pt needing repeat bone density and f/u in office.  Orders placed, appt scheduled, pt verbalized understanding.

## 2023-09-02 DIAGNOSIS — S32050A Wedge compression fracture of fifth lumbar vertebra, initial encounter for closed fracture: Secondary | ICD-10-CM | POA: Diagnosis not present

## 2023-09-02 DIAGNOSIS — Z6826 Body mass index (BMI) 26.0-26.9, adult: Secondary | ICD-10-CM | POA: Diagnosis not present

## 2023-09-09 ENCOUNTER — Telehealth: Payer: Self-pay

## 2023-09-09 NOTE — Telephone Encounter (Signed)
Transition Care Management Follow-up Telephone Call Date of discharge and from where: 08/24/2023 [facility] How have you been since you were released from the hospital? Patient stated she is feeling better but still has some pain. Any questions or concerns? No  Items Reviewed: Did the pt receive and understand the discharge instructions provided? Yes  Medications obtained and verified? Yes  Other? No  Any new allergies since your discharge? No  Dietary orders reviewed? Yes Do you have support at home? Yes   Follow up appointments reviewed:  PCP Hospital f/u appt confirmed? Yes  Scheduled to see  on 10/01/2023 @ [facility] [place]. Specialist Hospital f/u appt confirmed? No  Scheduled to see  on  @ . Are transportation arrangements needed? No  If their condition worsens, is the pt aware to call PCP or go to the Emergency Dept.? Yes Was the patient provided with contact information for the PCP's office or ED? Yes Was to pt encouraged to call back with questions or concerns? Yes   Anis Degidio [facility]  [facility], [facility] Guide Direct Dial: [phone]  Website: [email or web address]

## 2023-09-27 ENCOUNTER — Other Ambulatory Visit: Payer: Self-pay | Admitting: Family Medicine

## 2023-09-27 DIAGNOSIS — E7800 Pure hypercholesterolemia, unspecified: Secondary | ICD-10-CM

## 2023-09-29 ENCOUNTER — Ambulatory Visit (HOSPITAL_BASED_OUTPATIENT_CLINIC_OR_DEPARTMENT_OTHER)
Admission: RE | Admit: 2023-09-29 | Discharge: 2023-09-29 | Disposition: A | Discharge status: Home / Self Care | Payer: Medicare HMO | Source: Ambulatory Visit | Attending: Hematology and Oncology | Admitting: Hematology and Oncology

## 2023-09-29 DIAGNOSIS — Z79811 Long term (current) use of aromatase inhibitors: Secondary | ICD-10-CM | POA: Insufficient documentation

## 2023-09-29 DIAGNOSIS — M8580 Other specified disorders of bone density and structure, unspecified site: Secondary | ICD-10-CM | POA: Diagnosis not present

## 2023-09-29 DIAGNOSIS — Z780 Asymptomatic menopausal state: Secondary | ICD-10-CM | POA: Diagnosis not present

## 2023-09-29 DIAGNOSIS — C50411 Malignant neoplasm of upper-outer quadrant of right female breast: Secondary | ICD-10-CM | POA: Diagnosis not present

## 2023-09-29 DIAGNOSIS — Z170 Estrogen receptor positive status [ER+]: Secondary | ICD-10-CM | POA: Diagnosis not present

## 2023-10-01 ENCOUNTER — Ambulatory Visit: Payer: Medicare HMO

## 2023-10-01 VITALS — BP 120/60 | HR 64 | Temp 98.1°F | Ht 66.5 in | Wt 173.3 lb

## 2023-10-01 DIAGNOSIS — Z0000 Encounter for general adult medical examination without abnormal findings: Secondary | ICD-10-CM

## 2023-10-01 NOTE — Progress Notes (Signed)
Subjective:   Kelly George is a 77 y.o. female who presents for Medicare Annual (Subsequent) preventive examination.  Visit Complete: In person    Cardiac Risk Factors include: advanced age (>81men, >79 women);dyslipidemia     Objective:    Today's Vitals   10/01/23 1847  BP: 120/60  Pulse: 64  Temp: 98.1 F (36.7 C)  TempSrc: Oral  SpO2: 95%  Weight: 173 lb 4.8 oz (78.6 kg)  Height: 5' 6.5" (1.689 m)   Body mass index is 27.55 kg/m.     10/01/2023    7:01 PM 08/24/2023   11:20 AM 09/27/2022   11:33 AM 03/14/2022   12:52 PM 09/11/2021    3:01 PM 04/13/2021   11:52 AM 11/28/2020   10:24 AM  Advanced Directives  Does Patient Have a Medical Advance Directive? Yes  Yes Yes Yes Yes Yes  Type of Estate agent of [place];Living will  Healthcare Power of [place];Living will Healthcare Power of [place];Living will Healthcare Power of [place];Living will Healthcare Power of [place];Living will Healthcare Power of [place];Living will  Does patient want to make changes to medical advance directive?       No - Patient declined  Copy of Healthcare Power of Attorney in Chart? No - copy requested  No - copy requested No - copy requested No - copy requested No - copy requested No - copy requested  Would patient like information on creating a medical advance directive?       No - Patient declined     Information is confidential and restricted. Go to Review Flowsheets to unlock data.    Current Medications (verified) Outpatient Encounter Medications as of 10/01/2023  Medication Sig   anastrozole (ARIMIDEX) 1 MG tablet TAKE 1 TABLET BY MOUTH EVERY DAY   Ascorbic Acid (VITAMIN C) 1000 MG tablet Take 1,000 mg by mouth daily.   CALCIUM CARBONATE-VITAMIN D PO Take 2 tablets by mouth daily.   Cholecalciferol (VITAMIN D3) 2000 UNITS TABS Take 5,000 Units by mouth daily.    glucosamine-chondroitin 500-400 MG tablet Take 1 tablet by mouth 2 (two) times daily.     ibuprofen (ADVIL) 200 MG tablet Take 200 mg by mouth every 6 (six) hours as needed. 2 tabs daily prn   Omega-3 Fatty Acids (EQL OMEGA 3 FISH OIL) 1000 MG CPDR Take 2 capsules by mouth daily.   oxyCODONE (ROXICODONE) 5 MG immediate release tablet Take 1 tablet (5 mg total) by mouth every 6 (six) hours as needed for severe pain (pain score 7-10).   sertraline (ZOLOFT) 100 MG tablet Take 1 tablet (100 mg total) by mouth daily.   simvastatin (ZOCOR) 10 MG tablet TAKE 1 TABLET BY MOUTH DAILY AT NIGHT   No facility-administered encounter medications on file as of 10/01/2023.    Allergies (verified) Penicillins and Tetracyclines & related   History: Past Medical History:  Diagnosis Date   Anxiety    Depression    Heart murmur    Hyperlipidemia    RBBB    Seasonal allergies    Past Surgical History:  Procedure Laterality Date   BREAST BIOPSY Right 10/2020   x2   BREAST EXCISIONAL BIOPSY Left    COLONOSCOPY  07/07/2001   MASTECTOMY Right 11/2020   MASTECTOMY W/ SENTINEL NODE BIOPSY Right 11/28/2020   Procedure: RIGHT MASTECTOMY WITH RIGHT AXILLARY SENTINEL LYMPH NODE BIOPSY;  Surgeon: Emelia Loron, MD;  Location: [facility];  Service: General;  Laterality: Right;  RNFA, PEC BLOCK  SKIN SURGERY     DERM - nasal    TOOTH EXTRACTION     TUBAL LIGATION     Family History  Problem Relation Age of Onset   Breast cancer Mother 75   COPD Mother    Diabetes Father    Cancer Father        Brain   Heart attack Father 35   Alcohol abuse Father    Depression Father    Uterine cancer Maternal Aunt    Hearing loss Maternal Grandmother    Hypertension Maternal Grandfather    Kidney disease Maternal Grandfather    Stroke Maternal Grandfather    High Cholesterol Brother    Hypertension Brother    Hyperlipidemia Brother    Autoimmune disease Daughter    Ankylosing spondylitis Daughter    Healthy Son    Depression Son    Social History   Socioeconomic History    Marital status: Divorced    Spouse name: Not on file   Number of children: 2   Years of education: Not on file   Highest education level: Associate degree: occupational, Scientist, product/process development, or vocational program  Occupational History   Occupation: Retired  Tobacco Use   Smoking status: Never    Passive exposure: Past   Smokeless tobacco: Never  Vaping Use   Vaping status: Never Used  Substance and Sexual Activity   Alcohol use: Yes    Comment: occasionally   Drug use: No   Sexual activity: Not Currently  Other Topics Concern   Not on file  Social History Narrative   Live alone.     Patient is right-handed. She lives alone in a 1 story house, 3-4 steps to enter.    Social Drivers of Corporate investment banker Strain: Low Risk  (10/01/2023)   Overall Financial Resource Strain (CARDIA)    Difficulty of Paying Living Expenses: Not hard at all  Food Insecurity: No Food Insecurity (10/01/2023)   Hunger Vital Sign    Worried About Running Out of Food in the Last Year: Never true    Ran Out of Food in the Last Year: Never true  Transportation Needs: No Transportation Needs (10/01/2023)   PRAPARE - Administrator, Civil Service (Medical): No    Lack of Transportation (Non-Medical): No  Physical Activity: Inactive (10/01/2023)   Exercise Vital Sign    Days of Exercise per Week: 0 days    Minutes of Exercise per Session: 0 min  Stress: No Stress Concern Present (10/01/2023)   [employer] of Occupational Health - Occupational Stress Questionnaire    Feeling of Stress : Not at all  Recent Concern: Stress - Stress Concern Present (08/08/2023)   [employer] of Occupational Health - Occupational Stress Questionnaire    Feeling of Stress : To some extent  Social Connections: Socially Isolated (10/01/2023)   Social Connection and Isolation Panel [NHANES]    Frequency of Communication with Friends and Family: More than three times a week    Frequency of Social  Gatherings with Friends and Family: More than three times a week    Attends Religious Services: Never    Database administrator or Organizations: No    Attends Engineer, structural: Never    Marital Status: Divorced    Tobacco Counseling Counseling given: Not Answered   Clinical Intake:  Pre-visit preparation completed: Yes  Pain : No/denies pain     Nutritional Risks: None Diabetes: No  How often do  you need to have someone help you when you read instructions, pamphlets, or other written materials from your doctor or pharmacy?: 1 - Never  Interpreter Needed?: No  Information entered by :: Theresa Mulligan LPN   Activities of Daily Living    10/01/2023    6:59 PM  In your present state of health, do you have any difficulty performing the following activities:  Hearing? 0  Vision? 0  Difficulty concentrating or making decisions? 0  Walking or climbing stairs? 0  Dressing or bathing? 0  Doing errands, shopping? 0  Preparing Food and eating ? N  Using the Toilet? N  In the past six months, have you accidently leaked urine? N  Do you have problems with loss of bowel control? N  Managing your Medications? N  Managing your Finances? N  Housekeeping or managing your Housekeeping? N    Patient Care Team: Kristian Covey, MD as PCP - General (Family Medicine) Huel Cote, MD (Obstetrics and Gynecology) Drema Dallas, DO as Consulting Physician (Neurology) Serena Croissant, MD as Consulting Physician (Hematology and Oncology) Emelia Loron, MD as Consulting Physician (General Surgery)  Indicate any recent Medical Services you may have received from other than [facility] providers in the past year (date may be approximate).     Assessment:   This is a routine wellness examination for [place].  Hearing/Vision screen Hearing Screening - Comments:: Denies hearing difficulties   Vision Screening - Comments:: Wears rx glasses - up to date with routine eye  exams with  Dr Sherrine Maples   Goals Addressed               This Visit's Progress     Increase physical activity (pt-stated)         Depression Screen    10/01/2023    6:57 PM 08/11/2023   11:19 AM 03/11/2023   10:47 AM 09/27/2022   11:28 AM 07/30/2022    9:38 AM 03/26/2022   10:36 AM 09/11/2021    3:02 PM  PHQ 2/9 Scores  PHQ - 2 Score 0 1 5 0 0 0 0  PHQ- 9 Score 0 7 10 0 3 1     Fall Risk    10/01/2023    7:00 PM 09/27/2022   11:31 AM 09/23/2022    1:23 PM 07/30/2022    9:39 AM 09/11/2021    3:01 PM  Fall Risk   Falls in the past year? 1 1 1 1 1   Comment     lost balance  Number falls in past yr: 0 1 1 0 0  Injury with Fall? 0 0 1 0 0  Comment  No injury followed by medical attention     Risk for fall due to : No Fall Risks Impaired balance/gait  No Fall Risks Medication side effect;Impaired balance/gait  Follow up Falls prevention discussed Falls prevention discussed  Falls evaluation completed Falls evaluation completed;Education provided;Falls prevention discussed    MEDICARE RISK AT HOME: Medicare Risk at Home Any stairs in or around the home?: Yes If so, are there any without handrails?: No Home free of loose throw rugs in walkways, pet beds, electrical cords, etc?: Yes Adequate lighting in your home to reduce risk of falls?: Yes Life alert?: No Use of a cane, walker or w/c?: No Grab bars in the bathroom?: Yes Shower chair or bench in shower?: Yes Elevated toilet seat or a handicapped toilet?: Yes  TIMED UP AND GO:  Was the test performed?  Yes  Length of time to ambulate 10 feet: 10 sec Gait steady and fast without use of assistive device    Cognitive Function:    08/14/2018    3:51 PM 05/20/2018    3:08 PM 03/11/2018   11:25 AM 06/25/2017   10:25 AM  MMSE - Mini Mental State Exam  Orientation to time 5 5 5 5   Orientation to Place 5 5 5 5   Registration 3 3 3 3   Attention/ Calculation 5 5 5 5   Recall 3 2 3 1   Language- name 2 objects 2 2 2 2    Language- repeat 1 1 1 1   Language- follow 3 step command 3 3 3 3   Language- read & follow direction 1 1 1 1   Write a sentence 1 1 1 1   Copy design 1 0 1 1  Total score 30 28 30 28         10/01/2023    7:01 PM 09/27/2022   11:34 AM 09/11/2021    3:04 PM 08/16/2019    9:49 AM  6CIT Screen  What Year? 0 points 0 points 0 points 0 points  What month? 0 points 0 points 0 points 0 points  What time? 0 points 0 points 0 points 0 points  Count back from 20 0 points 0 points 0 points 0 points  Months in reverse 0 points 0 points 0 points 0 points  Repeat phrase 0 points 0 points 2 points 0 points  Total Score 0 points 0 points 2 points 0 points    Immunizations Immunization History  Administered Date(s) Administered   Fluad Quad(high Dose 65+) 07/13/2019, 07/30/2022   Influenza Whole 08/28/2012   Influenza, High Dose Seasonal PF 08/18/2015, 09/30/2016, 08/04/2017, 07/16/2018, 07/31/2020, 08/06/2021, 07/07/2023   Influenza,inj,Quad PF,6+ Mos 07/08/2013, 08/10/2014   Moderna Covid-19 Vaccine Bivalent Booster 24yrs & up 08/06/2021   Moderna Sars-Covid-2 Vaccination 01/13/2020, 02/02/2020   Pneumococcal Conjugate-13 01/05/2014   Pneumococcal Polysaccharide-23 04/13/2010   Tdap 08/06/2011   Zoster Recombinant(Shingrix) 10/22/2020, 02/27/2021   Zoster, Live 01/13/2007    TDAP status: Due, Education has been provided regarding the importance of this vaccine. Advised may receive this vaccine at local pharmacy or Health Dept. Aware to provide a copy of the vaccination record if obtained from local pharmacy or Health Dept. Verbalized acceptance and understanding.  Flu Vaccine status: Up to date  Pneumococcal vaccine status: Up to date  Covid-19 vaccine status: Declined, Education has been provided regarding the importance of this vaccine but patient still declined. Advised may receive this vaccine at local pharmacy or Health Dept.or vaccine clinic. Aware to provide a copy of the  vaccination record if obtained from local pharmacy or Health Dept. Verbalized acceptance and understanding.  Qualifies for Shingles Vaccine? Yes   Zostavax completed Yes   Shingrix Completed?: Yes  Screening Tests Health Maintenance  Topic Date Due   Hepatitis C Screening  Never done   DTaP/Tdap/Td (2 - Td or Tdap) 08/05/2021   COVID-19 Vaccine (5 - 2024-25 season) 06/15/2023   Medicare Annual Wellness (AWV)  09/30/2024   Pneumonia Vaccine 63+ Years old  Completed   INFLUENZA VACCINE  Completed   DEXA SCAN  Completed   Zoster Vaccines- Shingrix  Completed   HPV VACCINES  Aged Out   Colonoscopy  Discontinued    Health Maintenance  Health Maintenance Due  Topic Date Due   Hepatitis C Screening  Never done   DTaP/Tdap/Td (2 - Td or Tdap) 08/05/2021   COVID-19 Vaccine (5 -  2024-25 season) 06/15/2023    Bone Density status: Completed 09/29/23. Results reflect: Bone density results: NORMAL. Repeat every   years.   Additional Screening:  Hepatitis C Screening: does qualify;  Deferred  Vision Screening: Recommended annual ophthalmology exams for early detection of glaucoma and other disorders of the eye. Is the patient up to date with their annual eye exam?  Yes  Who is the provider or what is the name of the office in which the patient attends annual eye exams? Dr Sherrine Maples If pt is not established with a provider, would they like to be referred to a provider to establish care? No .   Dental Screening: Recommended annual dental exams for proper oral hygiene    Community Resource Referral / Chronic Care Management:  CRR required this visit?  No   CCM required this visit?  No     Plan:     I have personally reviewed and noted the following in the patient's chart:   Medical and social history Use of alcohol, tobacco or illicit drugs  Current medications and supplements including opioid prescriptions. Patient is currently taking opioid prescriptions. Information provided  to patient regarding non-opioid alternatives. Patient advised to discuss non-opioid treatment plan with their provider. Functional ability and status Nutritional status Physical activity Advanced directives List of other physicians Hospitalizations, surgeries, and ER visits in previous 12 months Vitals Screenings to include cognitive, depression, and falls Referrals and appointments  In addition, I have reviewed and discussed with patient certain preventive protocols, quality metrics, and best practice recommendations. A written personalized care plan for preventive services as well as general preventive health recommendations were provided to patient.     Tillie Rung, LPN   [id number]   After Visit Summary: (In Person-Declined) Patient declined AVS at this time.  Nurse Notes: None

## 2023-10-01 NOTE — Patient Instructions (Addendum)
Kelly George , Thank you for taking time to come for your Medicare Wellness Visit. I appreciate your ongoing commitment to your health goals. Please review the following plan we discussed and let me know if I can assist you in the future.   Referrals/Orders/Follow-Ups/Clinician Recommendations:   This is a list of the screening recommended for you and due dates:  Health Maintenance  Topic Date Due   Hepatitis C Screening  Never done   DTaP/Tdap/Td vaccine (2 - Td or Tdap) 08/05/2021   COVID-19 Vaccine (5 - 2024-25 season) 06/15/2023   Medicare Annual Wellness Visit  09/30/2024   Pneumonia Vaccine  Completed   Flu Shot  Completed   DEXA scan (bone density measurement)  Completed   Zoster (Shingles) Vaccine  Completed   HPV Vaccine  Aged Out   Colon Cancer Screening  Discontinued    Advanced directives: (Copy Requested) Please bring a copy of your health care power of attorney and living will to the office to be added to your chart at your convenience.  Next Medicare Annual Wellness Visit scheduled for next year: Yes

## 2023-10-02 ENCOUNTER — Inpatient Hospital Stay: Payer: Medicare HMO | Attending: Hematology and Oncology | Admitting: Hematology and Oncology

## 2023-10-02 VITALS — BP 141/80 | HR 98 | Temp 97.9°F | Resp 18 | Ht 66.5 in | Wt 170.7 lb

## 2023-10-02 DIAGNOSIS — Z79899 Other long term (current) drug therapy: Secondary | ICD-10-CM | POA: Diagnosis not present

## 2023-10-02 DIAGNOSIS — M8580 Other specified disorders of bone density and structure, unspecified site: Secondary | ICD-10-CM | POA: Diagnosis not present

## 2023-10-02 DIAGNOSIS — C50411 Malignant neoplasm of upper-outer quadrant of right female breast: Secondary | ICD-10-CM | POA: Insufficient documentation

## 2023-10-02 DIAGNOSIS — Z170 Estrogen receptor positive status [ER+]: Secondary | ICD-10-CM | POA: Insufficient documentation

## 2023-10-02 DIAGNOSIS — Z9011 Acquired absence of right breast and nipple: Secondary | ICD-10-CM | POA: Insufficient documentation

## 2023-10-02 DIAGNOSIS — Z6826 Body mass index (BMI) 26.0-26.9, adult: Secondary | ICD-10-CM | POA: Diagnosis not present

## 2023-10-02 DIAGNOSIS — Z79811 Long term (current) use of aromatase inhibitors: Secondary | ICD-10-CM | POA: Diagnosis not present

## 2023-10-02 DIAGNOSIS — S32050A Wedge compression fracture of fifth lumbar vertebra, initial encounter for closed fracture: Secondary | ICD-10-CM | POA: Diagnosis not present

## 2023-10-02 MED ORDER — ANASTROZOLE 1 MG PO TABS: 1.0000 mg | ORAL_TABLET | Freq: Every day | ORAL | 3 refills | Status: DC

## 2023-10-02 NOTE — Progress Notes (Signed)
Patient Care Team: Kristian Covey, MD as PCP - General (Family Medicine) Huel Cote, MD (Obstetrics and Gynecology) Drema Dallas, DO as Consulting Physician (Neurology) Serena Croissant, MD as Consulting Physician (Hematology and Oncology) Emelia Loron, MD as Consulting Physician (General Surgery)  DIAGNOSIS:  Encounter Diagnosis  Name Primary?   Malignant neoplasm of upper-outer quadrant of right breast in female, estrogen receptor positive (HCC) Yes    SUMMARY OF ONCOLOGIC HISTORY: Oncology History  Malignant neoplasm of upper-outer quadrant of right breast in female, estrogen receptor positive (HCC)  11/10/2020 Initial Diagnosis   Screening mammogram showed calcifications and a possible mass in the right breast. Diagnostic mammogram and US showed in the right breast, a 0.9cm cyst at the 8 o'clock position, a 1.0cm lesion at the 12 o'clock position, and no right axillary adenopathy. 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   11/28/2020 Cancer Staging   Staging form: Breast, AJCC 8th Edition - Pathologic stage from 11/28/2020: Stage IA (pT1c, pN0, cM0, G2, ER+, PR+, HER2-) - Signed by Loa Socks, NP on 04/10/2021 Stage prefix: Initial diagnosis Histologic grading system: 3 grade system   12/2020 -  Anti-estrogen oral therapy   Anastrozole daily     CHIEF COMPLIANT: Surveillance of breast cancer on anastrozole  HISTORY OF  PRESENT ILLNESS:   History of Present Illness   The patient, with a history of breast cancer, presents for a follow-up visit. She reports that the dizziness, which was a significant issue during the last visit, has resolved. She is unsure of the cause of the dizziness, but it has improved without any changes to her medication regimen. She continues to take anastrozole as part of her breast cancer treatment.  The patient also reports joint achiness, but she is unsure if it is due to normal aging or a side effect of the anastrozole. She recently had a bone density test, which showed normal results. She has a history of horseback riding, which may contribute to her joint issues.  In addition to her breast cancer and joint issues, the patient has been experiencing back pain. She recently had an MRI, which showed significant arthritis and some compression changes.         ALLERGIES:  is allergic to penicillins and tetracyclines & related.  MEDICATIONS:  Current Outpatient Medications  Medication Sig Dispense Refill   anastrozole (ARIMIDEX) 1 MG tablet Take 1 tablet (1 mg total) by mouth daily. 90 tablet 3   Ascorbic Acid (VITAMIN C) 1000 MG tablet Take 1,000 mg by mouth daily.     CALCIUM CARBONATE-VITAMIN D PO Take 2 tablets by mouth daily.     Cholecalciferol (VITAMIN D3) 2000 UNITS TABS Take 5,000 Units by mouth daily.      glucosamine-chondroitin 500-400 MG tablet Take 1 tablet by mouth 2 (two) times daily.      ibuprofen (ADVIL) 200 MG tablet Take 200 mg by mouth every 6 (six) hours as needed. 2 tabs daily  prn     Omega-3 Fatty Acids (EQL OMEGA 3 FISH OIL) 1000 MG CPDR Take 2 capsules by mouth daily.     oxyCODONE (ROXICODONE) 5 MG immediate release tablet Take 1 tablet (5 mg total) by mouth every 6 (six) hours as needed for severe pain (pain score 7-10). 15 tablet 0   sertraline (ZOLOFT) 100 MG tablet Take 1 tablet (100 mg total) by mouth daily. 90 tablet 1   simvastatin (ZOCOR) 10 MG  tablet TAKE 1 TABLET BY MOUTH DAILY AT NIGHT 90 tablet 1   No current facility-administered medications for this visit.    PHYSICAL EXAMINATION: ECOG PERFORMANCE STATUS: 1 - Symptomatic but completely ambulatory  Vitals:   10/02/23 0847  BP: (!) 141/80  Pulse: 98  Resp: 18  Temp: 97.9 F (36.6 C)  SpO2: 99%   Filed Weights   10/02/23 0847  Weight: 170 lb 11.2 oz (77.4 kg)    Physical Exam    No palpable lumps or nodules in the right chest wall or axilla.  No lumps in the left breast.     (exam performed in the presence of a chaperone)  LABORATORY DATA:  I have reviewed the data as listed    Latest Ref Rng & Units 08/11/2023   11:11 AM 07/30/2022   10:12 AM 12/15/2020   11:13 AM  CMP  Glucose 70 - 99 mg/dL 91  97  98   BUN 6 - 23 mg/dL 20  18  19    Creatinine 0.40 - 1.20 mg/dL 1.61  0.96  0.45   Sodium 135 - 145 mEq/L 139  139  140   Potassium 3.5 - 5.1 mEq/L 4.1  4.5  5.0   Chloride 96 - 112 mEq/L 100  102  104   CO2 19 - 32 mEq/L 30  32  29   Calcium 8.4 - 10.5 mg/dL 40.9  81.1  9.7   Total Protein 6.0 - 8.3 g/dL 7.2  7.6  6.6   Total Bilirubin 0.2 - 1.2 mg/dL 1.0  0.8  0.7   Alkaline Phos 39 - 117 U/L 78  66  66   AST 0 - 37 U/L 26  28  18    ALT 0 - 35 U/L 25  30  19      Lab Results  Component Value Date   WBC 4.2 08/11/2023   HGB 14.2 08/11/2023   HCT 43.9 08/11/2023   MCV 96.9 08/11/2023   PLT 226.0 08/11/2023   NEUTROABS 2.6 08/11/2023    ASSESSMENT & PLAN:  Malignant neoplasm of upper-outer quadrant of right breast in female, estrogen receptor positive (HCC) 11/10/2020: Screening mammogram showed calcifications and a possible mass in the right breast. Diagnostic mammogram and US showed in the right breast, a 0.9cm cyst at the 8 o'clock position, a 1.0cm lesion at the 12 o'clock position, and no right axillary adenopathy. 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   11/28/20: Rt  mastectomy:Grade 2 ILC 1.1 cm and Grade 2 IDC 0.2 cm, 0/2 LN 03/2021: Bone density: T score -1.2 (was -0.7:) Mild osteopenia   Plan: Adjuvant antiestrogen therapy with anastrozole 1 mg daily x5 to 7 years starting 12/12/2020   Anastrozole Toxicities: Tolerating it extremely well without any problems or concerns.  Occasional joint stiffness could be related to aging and not related to the treatment.   Breast Cancer Surveillance: 1. Mammogram  Left breast: 02/07/2023: Benign breast  density category C 2. Breast Exam 10/02/2023: Benign 3.  MRI lumbar spine 08/24/2023: Superior endplate compression deformity L5, multilevel degenerative changes 4.  Bone density 09/29/2023: T-score -1: Normal   Dizziness: Brain MRI 12/18/2022: Benign    Back Pain Recent MRI shows significant arthritis and compression changes in the lower back. Patient is scheduled to see Dr. Mikal Plane for further evaluation. -Continue current management and follow up with Dr. Mikal Plane.  General Health Maintenance -Annual follow-up appointment scheduled for December 2025.          No orders of the defined types were placed in this encounter.  The patient has a good understanding of the overall plan. she agrees with it. she will call with any problems that may develop before the next visit here. Total time spent: 30 mins including face to face time and time spent for planning, charting and co-ordination of care   Tamsen Meek, MD 10/02/23

## 2023-10-02 NOTE — Assessment & Plan Note (Signed)
11/10/2020: Screening mammogram showed calcifications and a possible mass in the right breast. Diagnostic mammogram and US showed in the right breast, a 0.9cm cyst at the 8 o'clock position, a 1.0cm lesion at the 12 o'clock position, and no right axillary adenopathy. 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   11/28/20: Rt mastectomy:Grade 2 ILC 1.1 cm and Grade 2 IDC 0.2 cm, 0/2 LN 03/2021: Bone density: T score -1.2 (was -0.7:) Mild osteopenia   Plan: Adjuvant antiestrogen therapy with anastrozole 1 mg daily x5 to 7 years starting 12/12/2020   Anastrozole Toxicities: Tolerating it extremely well without any problems or concerns.  Occasional joint stiffness could be related to aging and not related to the treatment.   Breast Cancer Surveillance: 1. Mammogram  Left breast: 02/07/2023: Benign breast density category C 2. Breast Exam 10/02/2023: Benign 3.  MRI lumbar spine 08/24/2023: Superior endplate compression deformity L5, multilevel degenerative changes 4.  Bone density 09/29/2023: T-score -1: Normal   Dizziness: Brain MRI 12/18/2022: Benign She will resume anastrozole therapy since the dizziness has disappeared.

## 2023-10-17 ENCOUNTER — Encounter: Payer: Self-pay | Admitting: Family Medicine

## 2023-10-17 ENCOUNTER — Ambulatory Visit (INDEPENDENT_AMBULATORY_CARE_PROVIDER_SITE_OTHER): Payer: Medicare Other | Admitting: Family Medicine

## 2023-10-17 VITALS — BP 122/78 | HR 73 | Temp 98.6°F | Ht 66.5 in | Wt 174.2 lb

## 2023-10-17 DIAGNOSIS — F339 Major depressive disorder, recurrent, unspecified: Secondary | ICD-10-CM | POA: Diagnosis not present

## 2023-10-17 DIAGNOSIS — S32050D Wedge compression fracture of fifth lumbar vertebra, subsequent encounter for fracture with routine healing: Secondary | ICD-10-CM

## 2023-10-17 DIAGNOSIS — R030 Elevated blood-pressure reading, without diagnosis of hypertension: Secondary | ICD-10-CM | POA: Diagnosis not present

## 2023-10-17 DIAGNOSIS — S32050A Wedge compression fracture of fifth lumbar vertebra, initial encounter for closed fracture: Secondary | ICD-10-CM | POA: Insufficient documentation

## 2023-10-17 DIAGNOSIS — F419 Anxiety disorder, unspecified: Secondary | ICD-10-CM | POA: Diagnosis not present

## 2023-10-17 MED ORDER — SERTRALINE HCL 50 MG PO TABS: ORAL_TABLET | ORAL | 0 refills | Status: DC

## 2023-10-17 NOTE — Progress Notes (Signed)
 Established Patient Office Visit  Subjective   Patient ID: Kelly George, female    DOB: 29-May-1946  Age: 78 y.o. MRN: [id number]  Chief Complaint  Patient presents with   Medical Management of Chronic Issues    HPI   Kelly George is seen today companied by her daughter to discuss several items as below  She has longstanding history of recurrent depression and anxiety symptoms.  She has been on sertraline  currently 100 mg daily for quite some time and wondering if this is losing some of its effect.  She still feels anxious at times.  Feels like her depression is relatively stable currently.  She initially inquired whether there might be an option for an acute as needed only treatment of her anxiety.  Denies any specific stressors at this time.  She does have history of breast cancer and is maintained on Arimidex .  She had recent onset of fairly severe low back pain back in November and went to the ER.  X-ray showed acute compression fracture at L5.  Followed by neurosurgery and out of the brace at this time.  She does recall riding her horse around late October and hit a bump and felt somewhat of a jolt but did not have acute severe pain at that time.  Her pain onset was about a week later.  She had subsequent bone density scan which was actually fairly favorable with osteopenia with lowest T-score -1.0.  Denies any other fractures.  She had recent labs with lipid, CMP, CBC which were all stable.  Past Medical History:  Diagnosis Date   Anxiety    Depression    Heart murmur    Hyperlipidemia    RBBB    Seasonal allergies    Past Surgical History:  Procedure Laterality Date   BREAST BIOPSY Right 10/2020   x2   BREAST EXCISIONAL BIOPSY Left    COLONOSCOPY  07/07/2001   MASTECTOMY Right 11/2020   MASTECTOMY W/ SENTINEL NODE BIOPSY Right 11/28/2020   Procedure: RIGHT MASTECTOMY WITH RIGHT AXILLARY SENTINEL LYMPH NODE BIOPSY;  Surgeon: Ebbie Cough, MD;  Location: [facility];  Service: General;  Laterality: Right;  RNFA, PEC BLOCK   SKIN SURGERY     DERM - nasal    TOOTH EXTRACTION     TUBAL LIGATION      reports that she has never smoked. She has been exposed to tobacco smoke. She has never used smokeless tobacco. She reports current alcohol use. She reports that she does not use drugs. family history includes Alcohol abuse in her father; Ankylosing spondylitis in her daughter; Autoimmune disease in her daughter; Breast cancer (age of onset: 64) in her mother; COPD in her mother; Cancer in her father; Depression in her father and son; Diabetes in her father; Healthy in her son; Hearing loss in her maternal grandmother; Heart attack (age of onset: 37) in her father; High Cholesterol in her brother; Hyperlipidemia in her brother; Hypertension in her brother and maternal grandfather; Kidney disease in her maternal grandfather; Stroke in her maternal grandfather; Uterine cancer in her maternal aunt. Allergies  Allergen Reactions   Penicillins Rash   Tetracyclines & Related Palpitations         Review of Systems  Constitutional:  Negative for chills, fever and malaise/fatigue.  Eyes:  Negative for blurred vision.  Respiratory:  Negative for shortness of breath.   Cardiovascular:  Negative for chest pain.  Neurological:  Negative for dizziness, weakness and headaches.  Psychiatric/Behavioral:  Negative for depression. The patient is nervous/anxious.       Objective:     BP 122/78 (BP Location: Left Arm, Cuff Size: Normal)   Pulse 73   Temp 98.6 F (37 C) (Oral)   Ht 5' 6.5 (1.689 m)   Wt 174 lb 3.2 oz (79 kg)   SpO2 96%   BMI 27.70 kg/m  BP Readings from Last 3 Encounters:  10/17/23 122/78  10/02/23 (!) 141/80  10/01/23 120/60   Wt Readings from Last 3 Encounters:  10/17/23 174 lb 3.2 oz (79 kg)  10/02/23 170 lb 11.2 oz (77.4 kg)  10/01/23 173 lb 4.8 oz (78.6 kg)      Physical Exam Vitals reviewed.  Constitutional:       General: She is not in acute distress.    Appearance: She is well-developed.  HENT:     Head: Normocephalic and atraumatic.  Neck:     Thyroid : No thyromegaly.     Vascular: No JVD.  Cardiovascular:     Rate and Rhythm: Normal rate and regular rhythm.     Heart sounds:     No gallop.  Pulmonary:     Effort: Pulmonary effort is normal. No respiratory distress.     Breath sounds: Normal breath sounds. No wheezing or rales.  Musculoskeletal:     Right lower leg: No edema.     Left lower leg: No edema.  Neurological:     Mental Status: She is alert.      No results found for any visits on 10/17/23.  Last CBC Lab Results  Component Value Date   WBC 4.2 08/11/2023   HGB 14.2 08/11/2023   HCT 43.9 08/11/2023   MCV 96.9 08/11/2023   MCH 31.9 11/18/2019   RDW 13.6 08/11/2023   PLT 226.0 08/11/2023   Last metabolic panel Lab Results  Component Value Date   GLUCOSE 91 08/11/2023   NA 139 08/11/2023   K 4.1 08/11/2023   CL 100 08/11/2023   CO2 30 08/11/2023   BUN 20 08/11/2023   CREATININE 0.63 08/11/2023   GFR 84.51 08/11/2023   CALCIUM 10.4 08/11/2023   PROT 7.2 08/11/2023   ALBUMIN 4.7 08/11/2023   LABGLOB 2.5 11/18/2019   AGRATIO 1.8 11/18/2019   BILITOT 1.0 08/11/2023   ALKPHOS 78 08/11/2023   AST 26 08/11/2023   ALT 25 08/11/2023   Last lipids Lab Results  Component Value Date   CHOL 189 08/11/2023   HDL 87.30 08/11/2023   LDLCALC 87 08/11/2023   TRIG 74.0 08/11/2023   CHOLHDL 2 08/11/2023      The 89-bzjm ASCVD risk score (Arnett DK, et al., 2019) is: 24%    Assessment & Plan:   #1 history of recurrent depression and chronic anxiety symptoms.  Patient requesting consideration for tapering off sertraline .  No real side effects but wonders if this is still effective for her.  Her current PHQ-9 is 3.  She feels like she is still having some daily anxiety symptoms.  We have strongly recommended against benzodiazepine especially with her age and risk of  falls being greater. -We agreed to taper off sertraline  with reducing from 100 to 50 mg daily for 2 weeks then 25 mg daily for 2 weeks and then discontinue -Will then plan on sending in Lexapro  5 mg once daily and she will give us  some feedback in a month to give us  updated -If she does not respond well to escitalopram  consider exploring possibility of  pharmaco-genomic testing ---if we can get this covered  #2 acute compression fracture L5.  Bone density was favorable with borderline osteopenia.  She is followed by neurosurgery.  Reiterated the importance of daily calcium and vitamin D .  Would consider repeat bone density in 2 years.  She is on anastrozole  for her breast cancer.  #3 initial elevated blood pressure.  Repeat left arm seated after rest 122/78.  Continue to monitor for now.     Wolm Scarlet, MD

## 2023-11-07 ENCOUNTER — Other Ambulatory Visit: Payer: Self-pay | Admitting: Family Medicine

## 2023-11-11 ENCOUNTER — Other Ambulatory Visit: Payer: Self-pay | Admitting: Family Medicine

## 2023-11-13 DIAGNOSIS — Z6827 Body mass index (BMI) 27.0-27.9, adult: Secondary | ICD-10-CM | POA: Diagnosis not present

## 2023-11-13 DIAGNOSIS — S32050A Wedge compression fracture of fifth lumbar vertebra, initial encounter for closed fracture: Secondary | ICD-10-CM | POA: Diagnosis not present

## 2023-11-19 ENCOUNTER — Ambulatory Visit: Payer: Self-pay | Admitting: Family Medicine

## 2023-11-19 NOTE — Telephone Encounter (Signed)
 Chief Complaint: Whoosing sound when turn head/lean back/look up Symptoms: Sounds like fluid in head (R side of back of head) Frequency: Intermittent when turn head Pertinent Negatives: Patient denies nausea, ear pain Disposition: [] ED /[] Urgent Care (no appt availability in office) / [x] Appointment(In office/virtual)/ []  [facility]/ [] Home Care/ [] Refused Recommended Disposition /[] [facility]/ []  Follow-up with PCP Additional Notes: Pt has a compressed vertebrae- pt not sure if this could be related. Pt recently weaned off zoloft . This RN educated pt on home care, new-worsening symptoms, when to call back/seek emergent care. Pt verbalized understanding and agrees to plan.Pt scheduled for ov.    Reason for Disposition  Ear congestion present > 48 hours  Answer Assessment - Initial Assessment Questions 1. LOCATION: Which ear is involved?       Both ears 2. SENSATION: Describe how the ear feels. (e.g. stuffy, full, plugged).      Whoosing sound when turn head 3. ONSET:  When did the ear symptoms start?       1 week 4. PAIN: Do you also have an earache? If Yes, ask: How bad is it? (Scale 1-10; or mild, moderate, severe)     No 5. CAUSE: What do you think is causing the ear congestion?     N/A 6. URI: Do you have a runny nose or cough?      No  Protocols used: Ear - Congestion-A-AH

## 2023-11-21 ENCOUNTER — Ambulatory Visit (INDEPENDENT_AMBULATORY_CARE_PROVIDER_SITE_OTHER): Payer: Medicare Other | Admitting: Family Medicine

## 2023-11-21 ENCOUNTER — Telehealth: Payer: Self-pay | Admitting: *Deleted

## 2023-11-21 VITALS — BP 108/80 | HR 79 | Temp 98.3°F | Wt 174.1 lb

## 2023-11-21 DIAGNOSIS — H9313 Tinnitus, bilateral: Secondary | ICD-10-CM

## 2023-11-21 DIAGNOSIS — R0989 Other specified symptoms and signs involving the circulatory and respiratory systems: Secondary | ICD-10-CM | POA: Diagnosis not present

## 2023-11-21 DIAGNOSIS — M25562 Pain in left knee: Secondary | ICD-10-CM

## 2023-11-21 NOTE — Patient Instructions (Signed)
 Will set up MR imaging to further assess your symptoms

## 2023-11-21 NOTE — Progress Notes (Signed)
 Established Patient Office Visit  Subjective   Patient ID: Kelly George, female    DOB: [birth year]  Age: 78 y.o. MRN: [id number]  Chief Complaint  Patient presents with   Ear Problem   Knee Pain    Patient complains of left knee pain, x1 week     HPI   Kelly George has history of mitral valve prolapse, osteoarthritis, history of right breast cancer, recurrent depression, hyperlipidemia.  She is seen today with several week history of bilateral swooshing sound in both ears.  Interestingly, she notices this mostly when sitting up and turning her head to the right or left.  She has had some vertigo past few days as well but has had this in the past.  The sound that she is hearing she has not noted previously.  Denies any headaches.  No visual changes.  No focal weakness.  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  She had MRI brain from her oncologist last March which showed no acute findings.  No known history of carotid disease.  Denies any recent cognitive changes.  Also relates some left knee pain for about one week.   Denies any injury.  No effusion.  No ecchymosis.  Pain is anterior to medial.     Past Medical History:  Diagnosis Date   Anxiety    Depression    Heart murmur    Hyperlipidemia    RBBB    Seasonal allergies    Past Surgical History:  Procedure Laterality Date   BREAST BIOPSY Right 10/2020   x2   BREAST EXCISIONAL BIOPSY Left    COLONOSCOPY  07/07/2001   MASTECTOMY Right 11/2020   MASTECTOMY W/ SENTINEL NODE BIOPSY Right 11/28/2020   Procedure: RIGHT MASTECTOMY WITH RIGHT AXILLARY SENTINEL LYMPH NODE BIOPSY;  Surgeon: Ebbie Cough, MD;  Location: [facility];  Service: General;  Laterality: Right;  RNFA, PEC BLOCK   SKIN SURGERY     DERM - nasal    TOOTH EXTRACTION     TUBAL LIGATION      reports that she has never smoked. She has been exposed to tobacco smoke. She has never used smokeless tobacco. She reports current  alcohol use. She reports that she does not use drugs. family history includes Alcohol abuse in her father; Ankylosing spondylitis in her daughter; Autoimmune disease in her daughter; Breast cancer (age of onset: 51) in her mother; COPD in her mother; Cancer in her father; Depression in her father and son; Diabetes in her father; Healthy in her son; Hearing loss in her maternal grandmother; Heart attack (age of onset: 22) in her father; High Cholesterol in her brother; Hyperlipidemia in her brother; Hypertension in her brother and maternal grandfather; Kidney disease in her maternal grandfather; Stroke in her maternal grandfather; Uterine cancer in her maternal aunt. Allergies  Allergen Reactions   Penicillins Rash   Tetracyclines & Related Palpitations    Review of Systems  Constitutional:  Negative for malaise/fatigue.  Eyes:  Negative for blurred vision.  Respiratory:  Negative for shortness of breath.   Cardiovascular:  Negative for chest pain.  Neurological:  Positive for dizziness. Negative for tremors, speech change, focal weakness, seizures, loss of consciousness, weakness and headaches.      Objective:     BP 108/80 (BP Location: Left Arm, Patient Position: Sitting, Cuff Size: Normal)   Pulse 79   Temp 98.3 F (36.8 C) (Oral)   Wt 174 lb 1.6 oz (79 kg)  SpO2 99%   BMI 27.68 kg/m  BP Readings from Last 3 Encounters:  11/21/23 108/80  10/17/23 122/78  10/02/23 (!) 141/80   Wt Readings from Last 3 Encounters:  11/21/23 174 lb 1.6 oz (79 kg)  10/17/23 174 lb 3.2 oz (79 kg)  10/02/23 170 lb 11.2 oz (77.4 kg)      Physical Exam Vitals reviewed.  Constitutional:      General: She is not in acute distress.    Appearance: She is well-developed. She is not ill-appearing.  Eyes:     Pupils: Pupils are equal, round, and reactive to light.  Neck:     Thyroid : No thyromegaly.     Vascular: No JVD.     Comments: No bruits noted Cardiovascular:     Rate and Rhythm: Normal  rate and regular rhythm.     Heart sounds:     No gallop.  Pulmonary:     Effort: Pulmonary effort is normal. No respiratory distress.     Breath sounds: Normal breath sounds. No wheezing or rales.  Musculoskeletal:     Cervical back: Neck supple.     Comments: Left knee full ROM     mild medial joint tenderness.   No effusion.  No warmth.    Neurological:     General: No focal deficit present.     Mental Status: She is alert and oriented to person, place, and time. Mental status is at baseline.     Cranial Nerves: No cranial nerve deficit.     Motor: No weakness.     Coordination: Coordination normal.     Gait: Gait normal.  Psychiatric:        Mood and Affect: Mood normal.        Thought Content: Thought content normal.        Judgment: Judgment normal.      No results found for any visits on 11/21/23.    The 10-year ASCVD risk score (Arnett DK, et al., 2019) is: 19.4%    Assessment & Plan:   #42    78 year old female who presents with several week history of abnormal swooshing sound in both ears worse with turning head to the right or left.  She is describing a few days of vertigo which is not clearly directional.  She denies any recent headaches, syncope, acute visual changes, focal weakness, or any other acute neurologic deficits.  She had MRI brain 11 months ago that showed no acute abnormalities  -Consider MR angiogram brain to further assess. -We did not hear any carotid bruits but might consider carotid Dopplers as well -follow up immediately for any new neurological symptoms.   #2 left knee pain.   ?degenerative arthritis vs meniscal.  No effusion.  Consider trial of topical Voltaren  gel.   Consider X-ray if no better in 2-3 weeks.     Wolm Scarlet, MD

## 2023-11-21 NOTE — Telephone Encounter (Signed)
 Received call from pt with complaint of bilateral knee pain from arthritis.  Pt requesting advice from MD if okay to use OTC Voltaren  gel.  Per MD okay to proceed, pt educated and verbalized understanding.

## 2023-11-24 ENCOUNTER — Encounter: Payer: Self-pay | Admitting: Family Medicine

## 2023-11-26 MED ORDER — ESCITALOPRAM OXALATE 10 MG PO TABS: 10.0000 mg | ORAL_TABLET | Freq: Every day | ORAL | 1 refills | Status: DC

## 2023-11-26 NOTE — Telephone Encounter (Signed)
Spoke with patient.  She is off Zoloft.   Daily pervasive anxiety symptoms.  Will try switch to Lexapro 10 mg po every day and recommend feedback in one month.

## 2023-12-18 ENCOUNTER — Other Ambulatory Visit: Payer: Self-pay | Admitting: Family Medicine

## 2023-12-26 ENCOUNTER — Other Ambulatory Visit: Payer: Self-pay | Admitting: Hematology and Oncology

## 2023-12-26 DIAGNOSIS — Z1231 Encounter for screening mammogram for malignant neoplasm of breast: Secondary | ICD-10-CM

## 2024-01-05 ENCOUNTER — Inpatient Hospital Stay: Payer: Medicare HMO | Attending: Hematology and Oncology | Admitting: Hematology and Oncology

## 2024-01-05 VITALS — BP 148/88 | HR 77 | Temp 97.6°F | Resp 17 | Ht 66.5 in | Wt 176.8 lb

## 2024-01-05 DIAGNOSIS — Z170 Estrogen receptor positive status [ER+]: Secondary | ICD-10-CM | POA: Insufficient documentation

## 2024-01-05 DIAGNOSIS — Z79811 Long term (current) use of aromatase inhibitors: Secondary | ICD-10-CM | POA: Insufficient documentation

## 2024-01-05 DIAGNOSIS — Z1732 Human epidermal growth factor receptor 2 negative status: Secondary | ICD-10-CM | POA: Insufficient documentation

## 2024-01-05 DIAGNOSIS — C50411 Malignant neoplasm of upper-outer quadrant of right female breast: Secondary | ICD-10-CM | POA: Insufficient documentation

## 2024-01-05 DIAGNOSIS — Z79899 Other long term (current) drug therapy: Secondary | ICD-10-CM | POA: Insufficient documentation

## 2024-01-05 DIAGNOSIS — Z1721 Progesterone receptor positive status: Secondary | ICD-10-CM | POA: Insufficient documentation

## 2024-01-05 NOTE — Assessment & Plan Note (Signed)
 11/10/2020: Screening mammogram showed calcifications and a possible mass in the right breast. Diagnostic mammogram and US showed in the right breast, a 0.9cm cyst at the 8 o'clock position, a 1.0cm lesion at the 12 o'clock position, and no right axillary adenopathy. 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   11/28/20: Rt mastectomy:Grade 2 ILC 1.1 cm and Grade 2 IDC 0.2 cm, 0/2 LN 03/2021: Bone density: T score -1.2 (was -0.7:) Mild osteopenia   Plan: Adjuvant antiestrogen therapy with anastrozole 1 mg daily x5 to 7 years starting 12/12/2020   Anastrozole Toxicities: Tolerating it extremely well without any problems or concerns.  Occasional joint stiffness could be related to aging and not related to the treatment.   Breast Cancer Surveillance: 1. Mammogram  Left breast: 02/07/2023: Benign breast density category C 2. Breast Exam 10/02/2023: Benign 3.  MRI lumbar spine 08/24/2023: Superior endplate compression deformity L5, multilevel degenerative changes 4.  Bone density 09/29/2023: T-score -1: Normal   Dizziness: Brain MRI 12/18/2022: Benign   Back Pain: follows with Dr. Mikal Plane. Anxiety disorder: On Lexapro Annual follow-up appointment scheduled for December 2025.

## 2024-01-05 NOTE — Progress Notes (Signed)
 Patient Care Team: Kristian Covey, MD as PCP - General (Family Medicine) Huel Cote, MD (Obstetrics and Gynecology) Drema Dallas, DO as Consulting Physician (Neurology) Serena Croissant, MD as Consulting Physician (Hematology and Oncology) Emelia Loron, MD as Consulting Physician (General Surgery)  DIAGNOSIS:  Encounter Diagnosis  Name Primary?   Malignant neoplasm of upper-outer quadrant of right breast in female, estrogen receptor positive (HCC) Yes    SUMMARY OF ONCOLOGIC HISTORY: Oncology History  Malignant neoplasm of upper-outer quadrant of right breast in female, estrogen receptor positive (HCC)  11/10/2020 Initial Diagnosis   Screening mammogram showed calcifications and a possible mass in the right breast. Diagnostic mammogram and US showed in the right breast, a 0.9cm cyst at the 8 o'clock position, a 1.0cm lesion at the 12 o'clock position, and no right axillary adenopathy. 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   11/28/2020 Cancer Staging   Staging form: Breast, AJCC 8th Edition - Pathologic stage from 11/28/2020: Stage IA (pT1c, pN0, cM0, G2, ER+, PR+, HER2-) - Signed by Loa Socks, NP on 04/10/2021 Stage prefix: Initial diagnosis Histologic grading system: 3 grade system   12/2020 -  Anti-estrogen oral therapy   Anastrozole daily     CHIEF COMPLIANT: Surveillance of breast cancer on anastrozole  therapy  HISTORY OF PRESENT ILLNESS:  History of Present Illness The patient, with a history of breast cancer, presents for a routine follow-up. She reports good tolerance of her current medication regimen, denying any side effects such as hot flashes. She has scheduled her annual mammogram for April. She has no chest pain or discomfort.  In December of the previous year, she had a bone density test, which showed normal results. However, she experienced a compressed fracture of her fifth lumbar vertebra, which was discovered after a day of horse riding led to severe back pain. The patient sought emergency care due to the intensity of the pain.     ALLERGIES:  is allergic to penicillins and tetracyclines & related.  MEDICATIONS:  Current Outpatient Medications  Medication Sig Dispense Refill   anastrozole (ARIMIDEX) 1 MG tablet Take 1 tablet (1 mg total) by mouth daily. 90 tablet 3   Ascorbic Acid (VITAMIN C) 1000 MG tablet Take 1,000 mg by mouth daily.     CALCIUM CARBONATE-VITAMIN D PO Take 2 tablets by mouth daily.     Cholecalciferol (VITAMIN D3) 2000 UNITS TABS Take 5,000 Units by mouth daily.      escitalopram (LEXAPRO) 10 MG tablet TAKE 1 TABLET BY MOUTH EVERY DAY 90 tablet 0   glucosamine-chondroitin 500-400 MG tablet Take 1 tablet by mouth 2 (two) times daily.      ibuprofen (ADVIL) 200 MG tablet Take 200 mg by mouth every 6 (six) hours as needed. 2 tabs daily prn     Omega-3 Fatty Acids (EQL OMEGA 3 FISH OIL) 1000 MG CPDR Take 2 capsules by mouth daily.     simvastatin (ZOCOR) 10 MG tablet  TAKE 1 TABLET BY MOUTH DAILY AT NIGHT 90 tablet 1   No current facility-administered medications for this visit.    PHYSICAL EXAMINATION: ECOG PERFORMANCE STATUS: 1 - Symptomatic but completely ambulatory  Vitals:   01/05/24 1115  BP: (!) 148/88  Pulse: 77  Resp: 17  Temp: 97.6 F (36.4 C)  SpO2: 99%   Filed Weights   01/05/24 1115  Weight: 176 lb 12.8 oz (80.2 kg)       LABORATORY DATA:  I have reviewed the data as listed    Latest Ref Rng & Units 08/11/2023   11:11 AM 07/30/2022   10:12 AM 12/15/2020   11:13 AM  CMP  Glucose 70 - 99 mg/dL 91  97  98   BUN 6 - 23 mg/dL 20  18  19    Creatinine 0.40 - 1.20 mg/dL 1.61  0.96  0.45   Sodium 135 - 145 mEq/L 139  139  140   Potassium 3.5 - 5.1 mEq/L 4.1  4.5  5.0   Chloride 96 - 112 mEq/L 100  102  104   CO2 19 - 32 mEq/L 30  32  29   Calcium 8.4 - 10.5 mg/dL 40.9  81.1  9.7   Total Protein 6.0 - 8.3 g/dL 7.2  7.6  6.6   Total Bilirubin 0.2 - 1.2 mg/dL 1.0  0.8  0.7   Alkaline Phos 39 - 117 U/L 78  66  66   AST 0 - 37 U/L 26  28  18    ALT 0 - 35 U/L 25  30  19      Lab Results  Component Value Date   WBC 4.2 08/11/2023   HGB 14.2 08/11/2023   HCT 43.9 08/11/2023   MCV 96.9 08/11/2023   PLT 226.0 08/11/2023   NEUTROABS 2.6 08/11/2023    ASSESSMENT & PLAN:  Malignant neoplasm of upper-outer quadrant of right breast in female, estrogen receptor positive (HCC) 11/10/2020: Screening mammogram showed calcifications and a possible mass in the right breast. Diagnostic mammogram and US showed in the right breast, a 0.9cm cyst at the 8 o'clock position, a 1.0cm lesion at the 12 o'clock position, and no right axillary adenopathy. 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   11/28/20: Rt mastectomy:Grade 2 ILC 1.1 cm and Grade 2 IDC 0.2 cm, 0/2 LN 03/2021: Bone density: T score -1.2 (was -0.7:) Mild osteopenia   Plan: Adjuvant antiestrogen therapy with anastrozole 1 mg daily x5 to 7 years starting 12/12/2020   Anastrozole Toxicities: Tolerating it extremely well without any problems or concerns.  Occasional joint stiffness could be related to aging and not related to the treatment.   Breast Cancer Surveillance: 1. Mammogram  Left breast: 02/07/2023: Benign breast density category C 2. Breast Exam 10/02/2023: Benign 3.  MRI lumbar spine 08/24/2023:  Superior endplate compression deformity L5, multilevel degenerative changes 4.  Bone density 09/29/2023: T-score -1: Normal   Dizziness: Brain MRI 12/18/2022: Benign   Back Pain: follows with Dr. Mikal Plane. Anxiety disorder: On Lexapro Annual follow-up appointment scheduled for December 2025.       No orders of the defined types were placed in this encounter.  The patient has a good understanding of the overall plan. she agrees with it. she will call with any problems that may develop before the next visit here. Total time spent: 30 mins including face to face time and time spent for planning,  charting and co-ordination of care   Tamsen Meek, MD 01/05/24

## 2024-01-11 LAB — SIGNATERA
SIGNATERA MTM READOUT: 0 MTM/ml
SIGNATERA TEST RESULT: NEGATIVE

## 2024-02-06 ENCOUNTER — Ambulatory Visit
Admission: RE | Admit: 2024-02-06 | Discharge: 2024-02-06 | Disposition: A | Discharge status: Home / Self Care | Source: Ambulatory Visit | Attending: Hematology and Oncology | Admitting: Hematology and Oncology

## 2024-02-06 DIAGNOSIS — Z1231 Encounter for screening mammogram for malignant neoplasm of breast: Secondary | ICD-10-CM

## 2024-02-27 DIAGNOSIS — Z872 Personal history of diseases of the skin and subcutaneous tissue: Secondary | ICD-10-CM | POA: Diagnosis not present

## 2024-02-27 DIAGNOSIS — Z08 Encounter for follow-up examination after completed treatment for malignant neoplasm: Secondary | ICD-10-CM | POA: Diagnosis not present

## 2024-02-27 DIAGNOSIS — L570 Actinic keratosis: Secondary | ICD-10-CM | POA: Diagnosis not present

## 2024-02-27 DIAGNOSIS — L821 Other seborrheic keratosis: Secondary | ICD-10-CM | POA: Diagnosis not present

## 2024-02-27 DIAGNOSIS — Z85828 Personal history of other malignant neoplasm of skin: Secondary | ICD-10-CM | POA: Diagnosis not present

## 2024-02-27 DIAGNOSIS — D225 Melanocytic nevi of trunk: Secondary | ICD-10-CM | POA: Diagnosis not present

## 2024-02-27 DIAGNOSIS — L814 Other melanin hyperpigmentation: Secondary | ICD-10-CM | POA: Diagnosis not present

## 2024-02-27 DIAGNOSIS — Z09 Encounter for follow-up examination after completed treatment for conditions other than malignant neoplasm: Secondary | ICD-10-CM | POA: Diagnosis not present

## 2024-03-17 ENCOUNTER — Other Ambulatory Visit: Payer: Self-pay | Admitting: Family Medicine

## 2024-03-26 ENCOUNTER — Other Ambulatory Visit: Payer: Self-pay | Admitting: Family Medicine

## 2024-03-26 DIAGNOSIS — E7800 Pure hypercholesterolemia, unspecified: Secondary | ICD-10-CM

## 2024-06-15 ENCOUNTER — Other Ambulatory Visit: Payer: Self-pay | Admitting: *Deleted

## 2024-06-15 DIAGNOSIS — C50411 Malignant neoplasm of upper-outer quadrant of right female breast: Secondary | ICD-10-CM

## 2024-06-15 NOTE — Progress Notes (Signed)
 Signatera renewal orders placed.

## 2024-06-29 ENCOUNTER — Emergency Department (HOSPITAL_BASED_OUTPATIENT_CLINIC_OR_DEPARTMENT_OTHER)

## 2024-06-29 ENCOUNTER — Other Ambulatory Visit: Payer: Self-pay

## 2024-06-29 ENCOUNTER — Emergency Department (HOSPITAL_BASED_OUTPATIENT_CLINIC_OR_DEPARTMENT_OTHER)
Admission: EM | Admit: 2024-06-29 | Discharge: 2024-06-29 | Disposition: A | Discharge status: Home / Self Care | Attending: Emergency Medicine | Admitting: Emergency Medicine

## 2024-06-29 DIAGNOSIS — R93422 Abnormal radiologic findings on diagnostic imaging of left kidney: Secondary | ICD-10-CM | POA: Insufficient documentation

## 2024-06-29 DIAGNOSIS — R93429 Abnormal radiologic findings on diagnostic imaging of unspecified kidney: Secondary | ICD-10-CM

## 2024-06-29 DIAGNOSIS — R109 Unspecified abdominal pain: Secondary | ICD-10-CM | POA: Insufficient documentation

## 2024-06-29 DIAGNOSIS — K449 Diaphragmatic hernia without obstruction or gangrene: Secondary | ICD-10-CM | POA: Diagnosis not present

## 2024-06-29 DIAGNOSIS — R1032 Left lower quadrant pain: Secondary | ICD-10-CM | POA: Diagnosis not present

## 2024-06-29 LAB — URINALYSIS, ROUTINE W REFLEX MICROSCOPIC
Bacteria, UA: NONE SEEN
Bilirubin Urine: NEGATIVE
Glucose, UA: NEGATIVE mg/dL
Ketones, ur: 15 mg/dL — AB
Nitrite: NEGATIVE
Protein, ur: NEGATIVE mg/dL
Specific Gravity, Urine: 1.016 (ref 1.005–1.030)
pH: 5.5 (ref 5.0–8.0)

## 2024-06-29 LAB — CBC
HCT: 40.2 % (ref 36.0–46.0)
Hemoglobin: 13.8 g/dL (ref 12.0–15.0)
MCH: 32.4 pg (ref 26.0–34.0)
MCHC: 34.3 g/dL (ref 30.0–36.0)
MCV: 94.4 fL (ref 80.0–100.0)
Platelets: 221 K/uL (ref 150–400)
RBC: 4.26 MIL/uL (ref 3.87–5.11)
RDW: 12.7 % (ref 11.5–15.5)
WBC: 6.8 K/uL (ref 4.0–10.5)
nRBC: 0 % (ref 0.0–0.2)

## 2024-06-29 LAB — COMPREHENSIVE METABOLIC PANEL WITH GFR
ALT: 20 U/L (ref 0–44)
AST: 24 U/L (ref 15–41)
Albumin: 4.5 g/dL (ref 3.5–5.0)
Alkaline Phosphatase: 73 U/L (ref 38–126)
Anion gap: 12 (ref 5–15)
BUN: 13 mg/dL (ref 8–23)
CO2: 26 mmol/L (ref 22–32)
Calcium: 10.1 mg/dL (ref 8.9–10.3)
Chloride: 103 mmol/L (ref 98–111)
Creatinine, Ser: 0.63 mg/dL (ref 0.44–1.00)
GFR, Estimated: >60 mL/min (ref >60)
Glucose, Bld: 103 mg/dL — ABNORMAL HIGH (ref 70–99)
Potassium: 3.8 mmol/L (ref 3.5–5.1)
Sodium: 141 mmol/L (ref 135–145)
Total Bilirubin: 0.8 mg/dL (ref 0.0–1.2)
Total Protein: 7.2 g/dL (ref 6.5–8.1)

## 2024-06-29 LAB — LIPASE, BLOOD: Lipase: 17 U/L (ref 11–51)

## 2024-06-29 MED ORDER — IOHEXOL 300 MG/ML  SOLN
80.0000 mL | Freq: Once | INTRAMUSCULAR | Status: AC | PRN
  Administered 2024-06-29: 80 mL via INTRAVENOUS

## 2024-06-29 MED ORDER — CEPHALEXIN 250 MG PO CAPS
500.0000 mg | ORAL_CAPSULE | Freq: Once | ORAL | Status: AC
  Administered 2024-06-29: 500 mg via ORAL

## 2024-06-29 MED ORDER — CEPHALEXIN 500 MG PO CAPS: 500.0000 mg | ORAL_CAPSULE | Freq: Four times a day (QID) | ORAL | 0 refills | Status: DC

## 2024-06-29 NOTE — ED Notes (Signed)
 Patient transported to CT

## 2024-06-29 NOTE — ED Triage Notes (Signed)
 C/o LLQ and left sided lower back pain x 2 days. States worse today. Denies n/v/d.

## 2024-06-29 NOTE — ED Notes (Signed)
 ED Provider at bedside.

## 2024-06-29 NOTE — ED Notes (Signed)
 Reviewed discharge instructions, medications, and home care with pt. Pt verbalized understanding and had no further questions. Pt exited ED without complications.

## 2024-06-29 NOTE — ED Provider Notes (Signed)
 [facility] EMERGENCY DEPARTMENT AT [facility] Provider Note   CSN: [id number] Arrival date & time: 06/29/24  1656     Patient presents with: Abdominal Pain   Kelly George is a 78 y.o. female.   Patient to ED for evaluation of left sided abdominal pain that started 2-3 days ago and reports it worsened this morning. No fever, nausea, vomiting. No change in bowel movements or blood in her stool. No history of diverticulitis. She reports the pain is mid-left abdomen and radiates to the lateral abdominal wall. No aggravating factors. She reports lying down usually helps. No urinary symptoms.   The history is provided by the patient and a relative. No language interpreter was used.  Abdominal Pain      Prior to Admission medications   Medication Sig Start Date End Date Taking? Authorizing Provider  cephALEXin  (KEFLEX ) 500 MG capsule Take 1 capsule (500 mg total) by mouth 4 (four) times daily. 06/29/24  Yes Enzo Treu, Margit, PA-C  anastrozole  (ARIMIDEX ) 1 MG tablet Take 1 tablet (1 mg total) by mouth daily. 10/02/23   Gudena, Vinay, MD  Ascorbic Acid (VITAMIN C) 1000 MG tablet Take 1,000 mg by mouth daily.    [provider]  CALCIUM CARBONATE-VITAMIN D  PO Take 2 tablets by mouth daily.    [provider]  Cholecalciferol (VITAMIN D3) 2000 UNITS TABS Take 5,000 Units by mouth daily.     [provider]  escitalopram  (LEXAPRO ) 10 MG tablet TAKE 1 TABLET BY MOUTH EVERY DAY 03/29/24   Burchette, Wolm ORN, MD  glucosamine-chondroitin 500-400 MG tablet Take 1 tablet by mouth 2 (two) times daily.     [provider]  ibuprofen (ADVIL) 200 MG tablet Take 200 mg by mouth every 6 (six) hours as needed. 2 tabs daily prn    [provider]  Omega-3 Fatty Acids (EQL OMEGA 3 FISH OIL) 1000 MG CPDR Take 2 capsules by mouth daily.    [provider]  simvastatin  (ZOCOR ) 10 MG tablet TAKE 1 TABLET BY MOUTH EVERY DAY AT NIGHT 03/29/24    Burchette, Wolm ORN, MD    Allergies: Penicillins and Tetracyclines & related    Review of Systems  Gastrointestinal:  Positive for abdominal pain.    Updated Vital Signs BP (!) 165/87   Pulse 66   Temp 98.4 F (36.9 C) (Oral)   Resp 18   SpO2 96%   Physical Exam Vitals and nursing note reviewed.  Constitutional:      General: She is not in acute distress.    Appearance: She is well-developed. She is not ill-appearing.  Pulmonary:     Effort: Pulmonary effort is normal.  Abdominal:     General: Bowel sounds are normal. There is no distension.     Palpations: Abdomen is soft.     Comments: Palpation does not affect the pain.  Musculoskeletal:        General: Normal range of motion.     Cervical back: Normal range of motion.  Skin:    General: Skin is warm and dry.  Neurological:     Mental Status: She is alert and oriented to person, place, and time.     (all labs ordered are listed, but only abnormal results are displayed) Labs Reviewed  URINALYSIS, ROUTINE W REFLEX MICROSCOPIC - Abnormal; Notable for the following components:      Result Value   Hgb urine dipstick SMALL (*)    Ketones, ur 15 (*)  Leukocytes,Ua SMALL (*)    All other components within normal limits  COMPREHENSIVE METABOLIC PANEL WITH GFR - Abnormal; Notable for the following components:   Glucose, Bld 103 (*)    All other components within normal limits  URINE CULTURE  CBC  LIPASE, BLOOD    EKG: None  Radiology: CT ABDOMEN PELVIS W CONTRAST Result Date: 06/29/2024 CLINICAL DATA:  Left lower quadrant pain for 2 days EXAM: CT ABDOMEN AND PELVIS WITH CONTRAST TECHNIQUE: Multidetector CT imaging of the abdomen and pelvis was performed using the standard protocol following bolus administration of intravenous contrast. RADIATION DOSE REDUCTION: This exam was performed according to the departmental dose-optimization program which includes automated exposure control, adjustment of the mA and/or kV  according to patient size and/or use of iterative reconstruction technique. CONTRAST:  80mL OMNIPAQUE  IOHEXOL  300 MG/ML  SOLN COMPARISON:  09/24/2006 FINDINGS: Lower chest: Lung bases are free of acute infiltrate or sizable effusion. Small 2-3 mm nodule is noted in the lateral right costophrenic angle stable from the prior exam consistent with a benign etiology. No follow-up is recommended. Hepatobiliary: No focal liver abnormality is seen. No gallstones, gallbladder wall thickening, or biliary dilatation. Pancreas: Unremarkable. No pancreatic ductal dilatation or surrounding inflammatory changes. Spleen: Normal in size without focal abnormality. Adrenals/Urinary Tract: Adrenal glands are within normal limits. Kidneys are well visualized bilaterally. Right kidney shows no renal calculi or obstructive change. Left kidney demonstrates minimal fullness of the collecting system with some enhancement of the wall of the renal pelvis and ureter with Peri ureteral stranding consistent with underlying UTI. No findings to suggest pyelonephritis are noted at this time. No ureteral stone is seen. The bladder is decompressed. Stomach/Bowel: No obstructive or inflammatory changes of the colon are noted. The appendix is within normal limits. No obstructive changes of the small bowel are seen. Sliding-type hiatal hernia is noted. Vascular/Lymphatic: Aortic atherosclerosis. No enlarged abdominal or pelvic lymph nodes. Reproductive: Uterus should show some calcifications likely related to uterine fibroids. A 3.3 cm simple appearing cyst is noted within the left adnexa. Other: No abdominal wall hernia or abnormality. No abdominopelvic ascites. Musculoskeletal: Degenerative changes of lumbar spine are noted. No acute abnormality is noted. IMPRESSION: Enhancement of the left renal pelvic wall and left ureter suggestive of underlying UTI. No obstructing stone is seen. No changes to suggest pyelonephritis are noted. Left adnexal  simple-appearing cyst measuring 3.3 cm. Recommend follow-up pelvic ultrasound in 6-12 months. Reference: JACR 2020 Feb;17(2):248-254 Electronically Signed   By: Oneil Devonshire M.D.   On: 06/29/2024 21:32     Procedures   Medications Ordered in the ED  cephALEXin  (KEFLEX ) capsule 500 mg (has no administration in time range)  iohexol  (OMNIPAQUE ) 300 MG/ML solution 80 mL (80 mLs Intravenous Contrast Given 06/29/24 2112)    Clinical Course as of 06/29/24 2225  Tue Jun 29, 2024  1928 Patient to ED with left sided abdominal pain x 3 days. Pain comes and goes. She is very well appearing with reassuring exam. No leukocytosis. UA and chemistries pending. An IV has been established. VSS, with mild hypertension. Given advanced age, with abdominal pain, will obtain a CT abd/pel for further evaluation [SU]  2208 CT resulted showing:  IMPRESSION: Enhancement of the left renal pelvic wall and left ureter suggestive of underlying UTI. No obstructing stone is seen. No changes to suggest pyelonephritis are noted.   Left adnexal simple-appearing cyst measuring 3.3 cm. Recommend follow-up pelvic ultrasound in 6-12 months. Reference: JACR 2020 Feb;17(2):248-254  UA negative  for infection. Culture added.   On recheck, she remains pain-free. Results of CT to be reviewed with Dr. Armenta to determine appropriate disposition.  [SU]  2218 Per Dr. Armenta, she will need urology follow up given mild hydronephritis and ureteral inflammation. Review of culture at that time. Will given Keflex  x 5 days. Strict return precautions.  [SU]    Clinical Course User Index [SU] Odell Balls, PA-C                                 Medical Decision Making Amount and/or Complexity of Data Reviewed Labs: ordered. Radiology: ordered.  Risk Prescription drug management.        Final diagnoses:  Flank pain  Abnormal CT scan, kidney    ED Discharge Orders          Ordered    cephALEXin  (KEFLEX ) 500 MG capsule   4 times daily        06/29/24 2222               Odell Balls, PA-C 06/29/24 2225    Armenta Canning, MD 07/04/24 1332

## 2024-06-29 NOTE — ED Provider Notes (Signed)
 I provided a substantive portion of the care of this patient.  I personally made/approved the management plan for this patient and take responsibility for the patient management.     Patient presents with left-sided flank pain for 2 to 3 days.  No significant associated symptoms.  Patient is alert and clinically well.  Lungs are clear.  Respiratory effort normal.  Abdomen soft without guarding.  Pain is not significantly reproducible.  No rash mass or fullness.  CT imaging as interpreted radiology suggests some left renal pelvic wall and ureter enhancement suggestive of UTI but no obstruction present.  At this time given that the patient is symptomatic with pain and CT findings recommend treatment with Keflex .  I have discussed with the patient need for close follow-up for resolution of symptoms and urology evaluation for CT findings of thickening of the ureteral wall and renal pelvis make sure this does not represent malignancy or other pathology requiring further diagnostic or interventional treatment.   Armenta Canning, MD 07/04/24 1331

## 2024-06-29 NOTE — Discharge Instructions (Signed)
 Take Keflex  as prescribed for presumed upper urinary infection. A culture is pending which will clarify if infection is present and can be reviewed on urology follow up.   Call Dr. Margot office (urology) to schedule an ED follow up for further evaluation of abnormal CT and recheck of your left sided pain.   If you develop severe pain, develop a fever or other new concern, return to the ED for further evaluation.   On your CT, there is also an incidental finding of what appears to be a simple cyst in the left pelvis. It is recommended to have a follow up pelvic ultrasound in 6-12 months. This can be arranged by your primary care provider.

## 2024-07-01 ENCOUNTER — Telehealth: Payer: Self-pay | Admitting: Family Medicine

## 2024-07-01 LAB — URINE CULTURE

## 2024-07-01 NOTE — Telephone Encounter (Signed)
 Patient dropped off document DNR x 2, to be filled out by provider. Patient requested to send it back via Call Patient to pick up within 7-days. Document is located in providers tray at front office.Please advise at [facility] [phone]

## 2024-07-02 ENCOUNTER — Ambulatory Visit: Payer: Self-pay

## 2024-07-02 NOTE — Telephone Encounter (Addendum)
 Appt on 9/22 with Dr [place].  Message sent to Dr Ozell for review as PCP is out of the office until 9/29.

## 2024-07-02 NOTE — Telephone Encounter (Signed)
 FYI Only or Action Required?: FYI only for provider.  Patient was last seen in primary care on 11/21/2023 by Kelly George ORN, MD.  Called Nurse Triage reporting Abdominal Pain.  Symptoms began several days ago.  Interventions attempted: OTC medications: Advil and Prescription medications: Keflex .  Symptoms are: gradually improving.  Triage Disposition: See PCP Within 2 Weeks (overriding See HCP Within 4 Hours (Or PCP Triage))  Patient/caregiver understands and will follow disposition?: Yes                             Copied from CRM [id number]. Topic: Clinical - Red Word Triage >> Jul 02, 2024  8:57 AM Kelly George wrote: Red Word that prompted transfer to Nurse Triage: Patient was send in the ED and discharged on 06/29/24 for abdominal pain/ next available appointment with pcp is 07/14/24  Patient is still having pain today Reason for Disposition  [1] MILD-MODERATE pain AND [2] constant AND [3] age > 60 years  Answer Assessment - Initial Assessment Questions 1. LOCATION: Where does it hurt?      Middle of left side  2. RADIATION: Does the pain shoot anywhere else? (e.g., chest, back)     Slightly radiates to left side of back  3. ONSET: When did the pain begin? (e.g., minutes, hours or days ago)      Ongoing for awhile, worsened on Monday 4. SUDDEN: Gradual or sudden onset?     Gradual  5. PATTERN Does the pain come and go, or is it constant?     Constant 6. SEVERITY: How bad is the pain?  (e.g., Scale 1-10; mild, moderate, or severe)     Rates pain a 4, states pain has improved since ED visit, describes pain as dull, yet intense 7. RECURRENT SYMPTOM: Have you ever had this type of stomach pain before? If Yes, ask: When was the last time? and What happened that time?      On and off for several months 8. CAUSE: What do you think is causing the stomach pain? (e.g., gallstones, recent abdominal surgery)     Inflamed kidney and ureter,  per ED diagnosis 9. RELIEVING/AGGRAVATING FACTORS: What makes it better or worse? (e.g., antacids, bending or twisting motion, bowel movement)     Walking 10. OTHER SYMPTOMS: Do you have any other symptoms? (e.g., back pain, diarrhea, fever, urination pain, vomiting)     Denies fever, denies diarrhea, denies vomiting, denies urination pain    Patient went to ED for symptoms on 06/29/24. Patient stated symptoms are improving, following treatment. No availability for HFU with PCP within 14 days of ED visit. Scheduled HFU with alternate provider in office on Monday. Advised patient to call back if symptoms worsen. Patient verbalized understanding.  Protocols used: Abdominal Pain - Female-A-AH

## 2024-07-04 NOTE — Telephone Encounter (Signed)
 Noted- ok to close.

## 2024-07-05 ENCOUNTER — Ambulatory Visit (INDEPENDENT_AMBULATORY_CARE_PROVIDER_SITE_OTHER): Admitting: Student in an Organized Health Care Education/Training Program

## 2024-07-05 ENCOUNTER — Inpatient Hospital Stay: Admitting: Family Medicine

## 2024-07-05 ENCOUNTER — Encounter: Payer: Self-pay | Admitting: Student in an Organized Health Care Education/Training Program

## 2024-07-05 ENCOUNTER — Ambulatory Visit: Payer: Self-pay

## 2024-07-05 VITALS — BP 117/72 | HR 87 | Wt 172.0 lb

## 2024-07-05 DIAGNOSIS — M542 Cervicalgia: Secondary | ICD-10-CM | POA: Insufficient documentation

## 2024-07-05 DIAGNOSIS — R3121 Asymptomatic microscopic hematuria: Secondary | ICD-10-CM | POA: Diagnosis not present

## 2024-07-05 DIAGNOSIS — R319 Hematuria, unspecified: Secondary | ICD-10-CM | POA: Insufficient documentation

## 2024-07-05 NOTE — Assessment & Plan Note (Signed)
 Pain is likely cervicogenic/MSK, due to tension and stress from recent activities, with mild pain exacerbated by movement.  I do not see any evidence of otitis media, the ear canal looks normal.  I recommended start ibuprofen 400 mg twice daily as needed, not exceeding three consecutive days. Avoid daily use to prevent stomach ulcers and kidney issues. Use ibuprofen only on bad days.  I recommended taking a few days off from unpacking.

## 2024-07-05 NOTE — Patient Instructions (Signed)
  VISIT SUMMARY: Today, you came in with ear and neck pain that started last Friday. You also mentioned some memory issues and a history of breast cancer, scoliosis, and back pain. We discussed your recent emergency department visit for abdominal pain, which has since resolved.  YOUR PLAN: -ACUTE MUSCULOSKELETAL NECK PAIN: Your neck pain is likely due to tension and stress from recent activities, such as unpacking boxes. You should take 400 mg of ibuprofen twice daily as needed, but do not exceed three consecutive days of use. Avoid daily use to prevent stomach ulcers and kidney issues. Use ibuprofen only on particularly bad days.  -CHRONIC BACK PAIN WITH COMPRESSION FRACTURE AND SCOLIOSIS: Your chronic back pain is managed with ibuprofen, but daily use is discouraged due to potential adverse effects. Use ibuprofen only on bad days to minimize these effects.  -HEMATURIA: Hematuria means there is blood in your urine. We need to follow up on this after your recent antibiotic treatment. Please provide a urine specimen today to check for resolution.  -MEMORY CONCERNS: Your memory issues are possibly related to stress, and there is no significant cognitive decline noted at this time.  -BREAST CANCER, ON ANASTROZOLE : There are no current issues related to your breast cancer treatment with anastrozole .  INSTRUCTIONS: Please provide a urine specimen today to check for the resolution of hematuria. Follow up as needed based on the results.

## 2024-07-05 NOTE — Progress Notes (Signed)
 Acute Office Visit  Subjective:     Patient ID: Kelly George, female    DOB: 21-May-1946, 78 y.o.   MRN: [id number]  Chief Complaint  Patient presents with   Ear Pain    From triage notes today extreme shooting pain in right ear, base of skull, and a spot on head that are really tender on the right side patient did just finished up keflex  last Wednesday for a kidney infection.Pain started Friday.     HPI  Discussed the use of AI scribe software for clinical note transcription with the patient, who gave verbal consent to proceed.  History of Present Illness Kelly George is an 78 year old female who presents with ear and neck pain. She is accompanied by her daughter, Harlene.  She has been experiencing a stabbing earache that began last Friday, which later extended to the base of her skull. The pain was intense last night but has since decreased. She describes the pain as not a headache but an earache, with some diminished hearing in the affected ear. No recent falls or trauma. She reports tender spots on her head that come and go, primarily on the right side.  She was in the emergency department on September 16th for left lower abdominal pain. A CT scan showed no bowel issues, but there was slight thickening of the ureter and kidney. She was treated with a 5-day course of Keflex , which she completed over the weekend. Her abdominal pain has since resolved.  She has a history of breast cancer and is currently on anastrozole . She also has a history of scoliosis and kyphosis, with intermittent back and neck pain. She has been taking 400 mg of ibuprofen daily for back pain for years.  She recently moved to a new apartment on August 7th, which she describes as a stressful but positive experience. She has been unpacking boxes, which may have contributed to her musculoskeletal pain. She also notes some memory issues, which she attributes to the stress of moving.      Objective:    BP 117/72   Pulse 87   Wt 172 lb (78 kg)   SpO2 97%   BMI 27.35 kg/m   Physical Exam  Gen: Well-appearing person, very pleasant to talk with Ears: Right ear canal is normal, no erythema, right tympanic membrane appears normal, no erythema, normal landmarks and light reflex, no posterior effusion, left ear tympanic membrane is normal MSK: She has point tenderness at the base of the skull on the right side where the paraspinal muscles of the neck insert.  Good range of motion of the neck. Heart: Regular, no murmur Lungs: Unlabored, clear throughout     Assessment & Plan:    Problem List Items Addressed This Visit       Unprioritized   Neck pain - Primary   Pain is likely cervicogenic/MSK, due to tension and stress from recent activities, with mild pain exacerbated by movement.  I do not see any evidence of otitis media, the ear canal looks normal.  I recommended start ibuprofen 400 mg twice daily as needed, not exceeding three consecutive days. Avoid daily use to prevent stomach ulcers and kidney issues. Use ibuprofen only on bad days.  I recommended taking a few days off from unpacking.      Hematuria   Urinalysis in the emergency department had microscopic hematuria.  CT at that time showed enhancement of the left renal pelvis and ureter.  Urinalysis and  culture did not show clear signs of a urinary tract infection.  Symptoms have now resolved.  I wonder if she passed a stone recently.  There were no signs of further nephrolithiasis.  She completed antibiotics with 5 days of Keflex .  Will recheck urinalysis today to see if the hematuria has resolved.  If hematuria is persistent, would recommend urology consultation to consider cystoscopy.      Relevant Orders   Urinalysis, Routine w reflex microscopic    Return if symptoms worsen or fail to improve.  Cleatus Debby Specking, MD

## 2024-07-05 NOTE — Assessment & Plan Note (Signed)
 Urinalysis in the emergency department had microscopic hematuria.  CT at that time showed enhancement of the left renal pelvis and ureter.  Urinalysis and culture did not show clear signs of a urinary tract infection.  Symptoms have now resolved.  I wonder if she passed a stone recently.  There were no signs of further nephrolithiasis.  She completed antibiotics with 5 days of Keflex .  Will recheck urinalysis today to see if the hematuria has resolved.  If hematuria is persistent, would recommend urology consultation to consider cystoscopy.

## 2024-07-05 NOTE — Telephone Encounter (Signed)
 FYI Only or Action Required?: Action required by provider: request for appointment.  Patient was last seen in primary care on 11/21/2023 by Micheal Wolm ORN, MD.  Called Nurse Triage reporting Otalgia.  Symptoms began several days ago.  Interventions attempted: Nothing.  Symptoms are: unchanged.  Triage Disposition: See HCP Within 4 Hours (Or PCP Triage)  Patient/caregiver understands and will follow disposition?: YesCopied from CRM [id number]. Topic: Clinical - Red Word Triage >> Jul 05, 2024 11:21 AM Henretta I wrote: Red Word that prompted transfer to Nurse Triage: extreme shooting pain in ear, base of skull, and a spot on head Reason for Disposition  [1] SEVERE pain (e.g., excruciating) and [2] not improved 2 hours after pain medicine (e.g., acetaminophen  or ibuprofen)  Answer Assessment - Initial Assessment Questions Pt saw nurse within assisted living and was told to contact pcp office. She said my ear looked ok. I finished up keflex  last Wednesday for a kidney infection. No office appts today. Pt is seeing Dr Jerrell at [facility] today at 1540.      1. LOCATION: Which ear is involved?     Right ear 2. ONSET: When did the ear pain start?      Friday  3. SEVERITY: How bad is the pain?  (Scale 1-10; mild, moderate or severe)     10 comes and goes 4. URI SYMPTOMS: Do you have a runny nose or cough?     denies 5. FEVER: Do you have a fever? If Yes, ask: What is your temperature, how was it measured, and when did it start?     denies 6. CAUSE: Have you been swimming recently?, How often do you use Q-TIPS?, Have you had any recent air travel or scuba diving?     [place]  7. OTHER SYMPTOMS: Do you have any other symptoms? (e.g., decreased hearing, dizziness, headache, stiff neck, vomiting)     Base of skull pain on right side.  Protocols used: [place]

## 2024-07-06 ENCOUNTER — Ambulatory Visit: Payer: Self-pay

## 2024-07-06 LAB — URINALYSIS, ROUTINE W REFLEX MICROSCOPIC
Bilirubin Urine: NEGATIVE
Hgb urine dipstick: NEGATIVE
Ketones, ur: 15 — AB
Leukocytes,Ua: NEGATIVE
Nitrite: NEGATIVE
Specific Gravity, Urine: 1.02 (ref 1.000–1.030)
Total Protein, Urine: NEGATIVE
Urine Glucose: NEGATIVE
Urobilinogen, UA: 0.2 (ref 0.0–1.0)
pH: 6 (ref 5.0–8.0)

## 2024-07-06 NOTE — Telephone Encounter (Signed)
 Appointment made for 07/16/2024 at 2:45 PM with patient's PCP Dr Micheal Patient also wants to know the result of her urine from yesterday when those results are back She states that her urinary issues seemed to have gotten better and cleared up  FYI Only or Action Required?: Action required by provider: update on patient condition and lab or test result follow-up needed.  Patient was last seen in primary care on 07/05/2024 by Jerrell Cleatus Ned, MD.  Called Nurse Triage reporting Information Only.  Triage Disposition: See PCP Within 2 Weeks  Patient/caregiver understands and will follow disposition?: Yes              Copied from CRM [id number]. Topic: Clinical - Red Word Triage >> Jul 06, 2024  2:53 PM Kelly George wrote: Red Word that prompted transfer to Nurse Triage: head/neck pain  Pt stated that she is experiencing some head/neck pain and would like to schedule an appt with pcp. Pt stated that she also is experiencing some kidney issues that she would like to discuss with the provider as well.    ----------------------------------------------------------------------- From previous Reason for Contact - Scheduling: Patient/patient representative is calling to schedule an appointment. Refer to attachments for appointment information. Reason for Disposition  Requesting regular office appointment  Answer Assessment - Initial Assessment Questions Has an appt with urologist on the 30th Urinary symptoms seemed to have cleared up Head, neck, ear pain---seen yesterday 07/05/2024 by Dr Jerrell & these were all addressed--patient just wants to follow up with her PCP Dr Micheal about that visit. She also wants to talk to him about her blood sugar from ER visit slightly elevated 103 per patient Patient also wants to discuss her memory issues. Patient also wants to discuss medication similar to Lexapro  for anxiety. Patient states that she just had a move to another place and had  anxiety with all that. She denies any thoughts about hurting herself or anyone else.  Patient states that she is not having any new symptoms that have not been addressed and is not in any acute distress. She states she just wants to follow up and meet with/discuss existing things with her PCP Dr Micheal. She is advised that if anything changes to call us  back. She is also advised that if anything worsens to go to the Emergency Room or call 911 She verbalized understanding.  Protocols used: Information Only Call - No Triage-A-AH

## 2024-07-07 ENCOUNTER — Other Ambulatory Visit: Payer: Self-pay

## 2024-07-07 ENCOUNTER — Emergency Department (HOSPITAL_BASED_OUTPATIENT_CLINIC_OR_DEPARTMENT_OTHER)
Admission: EM | Admit: 2024-07-07 | Discharge: 2024-07-07 | Disposition: A | Discharge status: Home / Self Care | Attending: Emergency Medicine | Admitting: Emergency Medicine

## 2024-07-07 ENCOUNTER — Ambulatory Visit: Payer: Self-pay | Admitting: Student in an Organized Health Care Education/Training Program

## 2024-07-07 ENCOUNTER — Emergency Department (HOSPITAL_BASED_OUTPATIENT_CLINIC_OR_DEPARTMENT_OTHER)

## 2024-07-07 DIAGNOSIS — I6782 Cerebral ischemia: Secondary | ICD-10-CM | POA: Diagnosis not present

## 2024-07-07 DIAGNOSIS — R519 Headache, unspecified: Secondary | ICD-10-CM | POA: Diagnosis present

## 2024-07-07 DIAGNOSIS — I672 Cerebral atherosclerosis: Secondary | ICD-10-CM | POA: Diagnosis not present

## 2024-07-07 DIAGNOSIS — M5481 Occipital neuralgia: Secondary | ICD-10-CM | POA: Insufficient documentation

## 2024-07-07 DIAGNOSIS — G9389 Other specified disorders of brain: Secondary | ICD-10-CM | POA: Diagnosis not present

## 2024-07-07 DIAGNOSIS — I6522 Occlusion and stenosis of left carotid artery: Secondary | ICD-10-CM | POA: Diagnosis not present

## 2024-07-07 LAB — COMPREHENSIVE METABOLIC PANEL WITH GFR
ALT: 16 U/L (ref 0–44)
AST: 23 U/L (ref 15–41)
Albumin: 4.3 g/dL (ref 3.5–5.0)
Alkaline Phosphatase: 67 U/L (ref 38–126)
Anion gap: 13 (ref 5–15)
BUN: 16 mg/dL (ref 8–23)
CO2: 25 mmol/L (ref 22–32)
Calcium: 9.8 mg/dL (ref 8.9–10.3)
Chloride: 102 mmol/L (ref 98–111)
Creatinine, Ser: 0.64 mg/dL (ref 0.44–1.00)
GFR, Estimated: >60 mL/min (ref >60)
Glucose, Bld: 89 mg/dL (ref 70–99)
Potassium: 3.9 mmol/L (ref 3.5–5.1)
Sodium: 140 mmol/L (ref 135–145)
Total Bilirubin: 0.6 mg/dL (ref 0.0–1.2)
Total Protein: 6.6 g/dL (ref 6.5–8.1)

## 2024-07-07 LAB — CBC WITH DIFFERENTIAL/PLATELET
Abs Immature Granulocytes: 0.01 K/uL (ref 0.00–0.07)
Basophils Absolute: 0 K/uL (ref 0.0–0.1)
Basophils Relative: 1 %
Eosinophils Absolute: 0.2 K/uL (ref 0.0–0.5)
Eosinophils Relative: 3 %
HCT: 37.9 % (ref 36.0–46.0)
Hemoglobin: 12.6 g/dL (ref 12.0–15.0)
Immature Granulocytes: 0 %
Lymphocytes Relative: 28 %
Lymphs Abs: 1.4 K/uL (ref 0.7–4.0)
MCH: 32.1 pg (ref 26.0–34.0)
MCHC: 33.2 g/dL (ref 30.0–36.0)
MCV: 96.4 fL (ref 80.0–100.0)
Monocytes Absolute: 0.5 K/uL (ref 0.1–1.0)
Monocytes Relative: 11 %
Neutro Abs: 2.8 K/uL (ref 1.7–7.7)
Neutrophils Relative %: 57 %
Platelets: 224 K/uL (ref 150–400)
RBC: 3.93 MIL/uL (ref 3.87–5.11)
RDW: 12.3 % (ref 11.5–15.5)
WBC: 4.9 K/uL (ref 4.0–10.5)
nRBC: 0 % (ref 0.0–0.2)

## 2024-07-07 MED ORDER — GABAPENTIN 100 MG PO CAPS
100.0000 mg | ORAL_CAPSULE | Freq: Once | ORAL | Status: AC
  Administered 2024-07-07: 100 mg via ORAL
  Filled 2024-07-07: qty 1

## 2024-07-07 MED ORDER — GABAPENTIN 100 MG PO CAPS: 100.0000 mg | ORAL_CAPSULE | Freq: Three times a day (TID) | ORAL | 0 refills | Status: DC

## 2024-07-07 MED ORDER — IOHEXOL 350 MG/ML SOLN
75.0000 mL | Freq: Once | INTRAVENOUS | Status: AC | PRN
  Administered 2024-07-07: 75 mL via INTRAVENOUS

## 2024-07-07 MED ORDER — ACETAMINOPHEN 500 MG PO TABS
1000.0000 mg | ORAL_TABLET | Freq: Once | ORAL | Status: AC
Start: 2024-07-07 — End: 2024-07-07
  Administered 2024-07-07: 1000 mg via ORAL
  Filled 2024-07-07: qty 2

## 2024-07-07 MED ORDER — OXYCODONE-ACETAMINOPHEN 5-325 MG PO TABS
1.0000 | ORAL_TABLET | ORAL | Status: DC | PRN
  Administered 2024-07-07: 1 via ORAL
  Filled 2024-07-07: qty 1

## 2024-07-07 NOTE — ED Provider Notes (Signed)
 Patient CT of the head and neck are unremarkable.  No acute findings.  She is having neuropathic type pain over the right side of her scalp going to the ear.  Some sort of maybe occipital neuralgia or maybe some sort of other neuropathic type pain/atypical migraine.  She is neurologically intact otherwise.  Will trial her on gabapentin  and have her follow-up with neurology and primary care.  Discharged in good condition.  No concern for stroke or any other acute process at this time.  This chart was dictated using voice recognition software.  Despite best efforts to proofread,  errors can occur which can change the documentation meaning.    Ruthe Cornet, DO 07/07/24 [phone]

## 2024-07-07 NOTE — Telephone Encounter (Signed)
 MyChart message from Dr. Jerrell to patient:  Kelly George,   It was nice meeting you and your daughter in the clinic the other day.  The urinalysis returned back to normal.  No signs of blood in your urine.  I do not think we need to do further testing at this point.  I suspect the small amount of blood seen in the urine sample from the emergency department was either due to a recently passed kidney stone or a minor transient urinary tract infection.  Both of which seem to be resolved now.  I hope your neck discomfort is improving as well, and that your stress with the recent move is improving.   Best,   Cleatus Jerrell, MD

## 2024-07-07 NOTE — Telephone Encounter (Signed)
 Lab results have been discussed.   Verbalized understanding? Yes  Are there any questions? No

## 2024-07-07 NOTE — ED Triage Notes (Signed)
 Pt caox4 ambulatory c/o stabbing pain that starts at the base of skull radiating to R ear intermittent since Monday. Took motrin around 1100 with no relief.

## 2024-07-07 NOTE — Discharge Instructions (Signed)
 Take gabapentin  as prescribed.  Recommend 1000 mg of Tylenol  every 6 hours as needed for discomfort as well.  Follow-up with neurology and your primary care doctor.

## 2024-07-07 NOTE — ED Provider Notes (Signed)
 [place] EMERGENCY DEPARTMENT AT [facility] Provider Note   CSN: [id number] Arrival date & time: 07/07/24  1149     Patient presents with: No chief complaint on file.   Kelly George is a 78 y.o. female.   78 year old female with a history of hyperlipidemia who presents to the emergency department with right-sided head pain.  Patient reports that since Friday she has been having intermittent shooting pains that go from her occiput to her ear.  Says that they last several seconds at a time.  Are becoming more frequent.  No exacerbating or alleviating factors including eating or brushing her teeth.  No rashes.  Denies any hearing loss or tinnitus to me.  No vision changes.  No arm weakness or numbness.  Saw her outpatient doctor who thought it was musculoskeletal because she was stating that it was worsened with movement at that time but denies this to me.       Prior to Admission medications   Medication Sig Start Date End Date Taking? Authorizing Provider  anastrozole  (ARIMIDEX ) 1 MG tablet Take 1 tablet (1 mg total) by mouth daily. 10/02/23   Gudena, Vinay, MD  Ascorbic Acid (VITAMIN C) 1000 MG tablet Take 1,000 mg by mouth daily.    [provider]  CALCIUM CARBONATE-VITAMIN D  PO Take 2 tablets by mouth daily.    [provider]  cephALEXin  (KEFLEX ) 500 MG capsule Take 1 capsule (500 mg total) by mouth 4 (four) times daily. Patient not taking: Reported on 07/05/2024 06/29/24   Odell Balls, PA-C  Cholecalciferol (VITAMIN D3) 2000 UNITS TABS Take 5,000 Units by mouth daily.     [provider]  escitalopram  (LEXAPRO ) 10 MG tablet TAKE 1 TABLET BY MOUTH EVERY DAY 03/29/24   Burchette, Wolm ORN, MD  glucosamine-chondroitin 500-400 MG tablet Take 1 tablet by mouth 2 (two) times daily.     [provider]  ibuprofen (ADVIL) 200 MG tablet Take 200 mg by mouth every 6 (six) hours as needed. 2 tabs daily prn    [provider]   Omega-3 Fatty Acids (EQL OMEGA 3 FISH OIL) 1000 MG CPDR Take 2 capsules by mouth daily.    [provider]  simvastatin  (ZOCOR ) 10 MG tablet TAKE 1 TABLET BY MOUTH EVERY DAY AT NIGHT 03/29/24   Burchette, Wolm ORN, MD    Allergies: Penicillins and Tetracyclines & related    Review of Systems  Updated Vital Signs BP (!) 150/85 (BP Location: Left Arm)   Pulse (!) 56   Temp 98.3 F (36.8 C) (Oral)   Resp 16   Ht 5' 8 (1.727 m)   Wt 76.2 kg   SpO2 98%   BMI 25.54 kg/m   Physical Exam Vitals and nursing note reviewed.  Constitutional:      General: She is not in acute distress.    Appearance: She is well-developed.  HENT:     Head: Normocephalic and atraumatic.     Right Ear: Tympanic membrane, ear canal and external ear normal.     Left Ear: Tympanic membrane, ear canal and external ear normal.     Ears:     Comments: No mastoid tenderness to palpation    Nose: Nose normal.  Eyes:     Extraocular Movements: Extraocular movements intact.     Conjunctiva/sclera: Conjunctivae normal.     Pupils: Pupils are equal, round, and reactive to light.  Neck:     Comments: No SCM or trapezius tenderness palpation.  No C-spine midline or paraspinal tenderness to palpation. Cardiovascular:     Rate and Rhythm: Normal rate and regular rhythm.     Heart sounds: No murmur heard.    Comments: No carotid bruits noted. Pulmonary:     Effort: Pulmonary effort is normal. No respiratory distress.     Breath sounds: Normal breath sounds.  Musculoskeletal:     Cervical back: Normal range of motion and neck supple.     Right lower leg: No edema.     Left lower leg: No edema.  Skin:    General: Skin is warm and dry.     Comments: No rash noted on right side of face or scalp  Neurological:     Mental Status: She is alert.     Comments: MENTAL STATUS: AAOx3 CRANIAL NERVES: II: Pupils equal and reactive 4 mm BL, no RAPD, no VF deficits III, IV, VI: EOM intact, no gaze preference or  deviation, no nystagmus. V: normal sensation to light touch in V1, V2, and V3 segments bilaterally VII: no facial weakness or asymmetry, no nasolabial fold flattening VIII: normal hearing to speech and finger friction IX, X: normal palatal elevation, no uvular deviation XI: 5/5 head turn and 5/5 shoulder shrug bilaterally XII: midline tongue protrusion MOTOR: 5/5 strength in R shoulder flexion, elbow flexion and extension, and grip strength. 5/5 strength in L shoulder flexion, elbow flexion and extension, and grip strength.  5/5 strength in R hip and knee flexion, knee extension, ankle plantar and dorsiflexion. 5/5 strength in L hip and knee flexion, knee extension, ankle plantar and dorsiflexion. SENSORY: Normal sensation to light touch in all extremities COORD: Normal finger to nose and heel to shin, no tremor, no dysmetria  Psychiatric:        Mood and Affect: Mood normal.     (all labs ordered are listed, but only abnormal results are displayed) Labs Reviewed  CBC WITH DIFFERENTIAL/PLATELET  COMPREHENSIVE METABOLIC PANEL WITH GFR    EKG: None  Radiology: No results found.   Procedures   Medications Ordered in the ED  oxyCODONE -acetaminophen  (PERCOCET/ROXICET) 5-325 MG per tablet 1 tablet (1 tablet Oral Given 07/07/24 1241)                                    Medical Decision Making Amount and/or Complexity of Data Reviewed Labs: ordered. Radiology: ordered.  Risk Prescription drug management.   78 year old female with a history of hyperlipidemia presents emergency department with shooting pain from her occiput to her ear  Initial Ddx:  Occipital neuralgia, occipital migraine, shingles, vertebral artery dissection, MSK pain  MDM/Course:  Patient presents to the emergency department with sharp intermittent bouts of shooting pain from her occiput to her ear.  No exacerbating or alleviating factors.  No rashes noted.  Ears appear normal without any abnormalities in  the ear canal as well.  No neurologic deficits.  Suspect that she likely is having occipital neuralgia; however, given her age will obtain vascular imaging at this point in time.  Signed out to the oncoming physician (Dr. Ruthe) awaiting imaging and labs.   This patient presents to the ED for concern of complaints listed in HPI, this involves an extensive number of treatment options, and is a complaint that carries with it a high risk of complications and morbidity. Disposition including potential need for admission considered.   Dispo: Pending remainder of workup  Additional history  obtained from daughter Records reviewed Outpatient Clinic Notes I have reviewed the patients home medications and made adjustments as needed Social Determinants of health:  Geriatric  Portions of this note were generated with Scientist, clinical (histocompatibility and immunogenetics). Dictation errors may occur despite best attempts at proofreading.     Final diagnoses:  Occipital neuralgia of right side    ED Discharge Orders     None          Yolande Lamar BROCKS, MD 07/07/24 [phone]

## 2024-07-08 ENCOUNTER — Telehealth: Payer: Self-pay | Admitting: *Deleted

## 2024-07-08 DIAGNOSIS — M5481 Occipital neuralgia: Secondary | ICD-10-CM

## 2024-07-08 NOTE — Telephone Encounter (Signed)
 Patient informed.

## 2024-07-08 NOTE — Telephone Encounter (Signed)
 Copied from CRM #[id number]. Topic: Referral - Request for Referral >> Jul 08, 2024  8:37 AM Anairis L wrote: Did the patient discuss referral with their provider in the last year? Yes (If No - schedule appointment) (If Yes - send message)  Appointment offered? Yes  Type of order/referral and detailed reason for visit: Neurology  Preference of office, provider, location: Neurology [place]   If referral order, have you been seen by this specialty before? Yes (If Yes, this issue or another issue? When? Where?  Can we respond through MyChart? Yes

## 2024-07-09 LAB — SIGNATERA ONLY (NATERA MANAGED)
SIGNATERA MTM READOUT: 0 MTM/ml
SIGNATERA TEST RESULT: NEGATIVE

## 2024-07-09 NOTE — Telephone Encounter (Signed)
 Referral placed.

## 2024-07-13 ENCOUNTER — Encounter: Payer: Self-pay | Admitting: Neurology

## 2024-07-13 DIAGNOSIS — N130 Hydronephrosis with ureteropelvic junction obstruction: Secondary | ICD-10-CM | POA: Diagnosis not present

## 2024-07-16 ENCOUNTER — Ambulatory Visit (INDEPENDENT_AMBULATORY_CARE_PROVIDER_SITE_OTHER): Admitting: Family Medicine

## 2024-07-16 VITALS — BP 138/84 | HR 81 | Temp 98.3°F | Ht 68.0 in | Wt 174.9 lb

## 2024-07-16 DIAGNOSIS — R3915 Urgency of urination: Secondary | ICD-10-CM | POA: Diagnosis not present

## 2024-07-16 DIAGNOSIS — R5383 Other fatigue: Secondary | ICD-10-CM | POA: Diagnosis not present

## 2024-07-16 DIAGNOSIS — M5481 Occipital neuralgia: Secondary | ICD-10-CM

## 2024-07-16 DIAGNOSIS — R10A0 Flank pain, unspecified side: Secondary | ICD-10-CM

## 2024-07-16 LAB — POCT URINALYSIS DIPSTICK
Bilirubin, UA: NEGATIVE
Blood, UA: NEGATIVE
Glucose, UA: NEGATIVE
Ketones, UA: NEGATIVE
Leukocytes, UA: NEGATIVE
Nitrite, UA: NEGATIVE
Protein, UA: NEGATIVE
Spec Grav, UA: 1.015 (ref 1.010–1.025)
Urobilinogen, UA: 0.2 U/dL
pH, UA: 6 (ref 5.0–8.0)

## 2024-07-16 LAB — TSH: TSH: 1.39 m[IU]/L (ref 0.40–4.50)

## 2024-07-16 LAB — VITAMIN B12: Vitamin B-12: 292 pg/mL (ref 200–1100)

## 2024-07-16 MED ORDER — BUPROPION HCL ER (XL) 150 MG PO TB24: 150.0000 mg | ORAL_TABLET | Freq: Every day | ORAL | 5 refills | Status: DC

## 2024-07-16 NOTE — Progress Notes (Signed)
 Established Patient Office Visit  Subjective   Patient ID: Kelly George, female    DOB: 06-27-1946  Age: 78 y.o. MRN: [id number]  Chief Complaint  Patient presents with   Hospitalization Follow-up    Pt reports she is following up from ER visit. Still having occipital pain- dull, also top of her head. Once in while ear pain on R side. Taking gabapentin  and tylenol . Pt brought about ER visit for kidney stone. Pt wants to know provider should check urinalysis. Denied any sx.     HPI   Kelly George is here for chronic management issues.  She has actually had a couple recent ER visits.  Her past medical history is significant for osteoarthritis multiple joints, history of breast cancer, hyperlipidemia, recurrent depression.  Her first ER visit was for left flank pain.  X-rays revealed 3.3 cm left adnexal cyst with recommendation to consider 37-month follow-up ultrasound.  No evidence for kidney stone.  Comprehensive chemistries, lipase, urinalysis unremarkable.  Her urine culture was negative.  She then returned to the ER on the 24th with fairly severe neck pain radiating from her occipital area anteriorly.  Sharp quality pain 10 out of 10 in severity.  CT angiogram of the head and neck revealed no acute abnormalities.  It was felt like she probably had occipital neuralgia and was to start on gabapentin  100 mg 3 times daily.  She has since increased this to 200 mg 3 times daily.  Her pain has reduced from 10 to 3 out of 10.  She has scheduled follow-up with neurologist on Monday. Denies any recent exertional headache.  No fever.  Other issue is fatigue.  She generally sleeps several hours at night and has recently been taking some daytime naps but still does not feel rested.  However, sleep is disrupted as she frequently gets up about 4 times at night.  She is generally drinking couple of beer and usually 1 glass of wine at night.  Does not drink any caffeinated beverages other than early morning.   No recent burning with urination.  [place]  She has a dog who she walks periodically.  Does have a longstanding history of depression currently on Lexapro  10 mg daily.  She feels like she has had some increased depression symptoms recently and scored 11 on PHQ-9 today.  Low motivation.  Some loss of interest in hobbies.  Past Medical History:  Diagnosis Date   Anxiety    Depression    Heart murmur    Hyperlipidemia    RBBB    Seasonal allergies    Past Surgical History:  Procedure Laterality Date   BREAST BIOPSY Right 10/2020   x2   BREAST EXCISIONAL BIOPSY Left    COLONOSCOPY  07/07/2001   MASTECTOMY Right 11/2020   MASTECTOMY W/ SENTINEL NODE BIOPSY Right 11/28/2020   Procedure: RIGHT MASTECTOMY WITH RIGHT AXILLARY SENTINEL LYMPH NODE BIOPSY;  Surgeon: Ebbie Cough, MD;  Location: [facility];  Service: General;  Laterality: Right;  RNFA, PEC BLOCK   SKIN SURGERY     DERM - nasal    TOOTH EXTRACTION     TUBAL LIGATION      reports that she has never smoked. She has been exposed to tobacco smoke. She has never used smokeless tobacco. She reports current alcohol use. She reports that she does not use drugs. family history includes Alcohol abuse in her father; Ankylosing spondylitis in her daughter; Autoimmune disease in her daughter; Breast  cancer (age of onset: 12) in her mother; COPD in her mother; Cancer in her father; Depression in her father and son; Diabetes in her father; Healthy in her son; Hearing loss in her maternal grandmother; Heart attack (age of onset: 37) in her father; High Cholesterol in her brother; Hyperlipidemia in her brother; Hypertension in her brother and maternal grandfather; Kidney disease in her maternal grandfather; Stroke in her maternal grandfather; Uterine cancer in her maternal aunt. Allergies  Allergen Reactions   Penicillins Rash   Tetracyclines & Related Palpitations    Review of Systems   Constitutional:  Positive for malaise/fatigue. Negative for chills and fever.  Eyes:  Negative for blurred vision.  Respiratory:  Negative for shortness of breath.   Cardiovascular:  Negative for chest pain.  Gastrointestinal:  Negative for abdominal pain, nausea and vomiting.  Genitourinary:  Negative for dysuria and hematuria.  Neurological:  Positive for headaches. Negative for dizziness, speech change, focal weakness, seizures, loss of consciousness and weakness.      Objective:     BP 138/84 (BP Location: Left Arm, Patient Position: Sitting, Cuff Size: Normal)   Pulse 81   Temp 98.3 F (36.8 C) (Oral)   Ht 5' 8 (1.727 m)   Wt 174 lb 14.4 oz (79.3 kg)   SpO2 97%   BMI 26.59 kg/m  BP Readings from Last 3 Encounters:  07/16/24 138/84  07/07/24 (!) 167/91  07/05/24 117/72   Wt Readings from Last 3 Encounters:  07/16/24 174 lb 14.4 oz (79.3 kg)  07/07/24 168 lb (76.2 kg)  07/05/24 172 lb (78 kg)      Physical Exam Vitals reviewed.  Constitutional:      General: She is not in acute distress.    Appearance: She is well-developed. She is not ill-appearing.  HENT:     Head: Normocephalic and atraumatic.  Eyes:     Pupils: Pupils are equal, round, and reactive to light.  Neck:     Thyroid : No thyromegaly.     Vascular: No JVD.  Cardiovascular:     Rate and Rhythm: Normal rate and regular rhythm.     Heart sounds:     No gallop.  Pulmonary:     Effort: Pulmonary effort is normal. No respiratory distress.     Breath sounds: Normal breath sounds. No wheezing or rales.  Musculoskeletal:     Cervical back: Neck supple.  Lymphadenopathy:     Cervical: No cervical adenopathy.  Neurological:     General: No focal deficit present.     Mental Status: She is alert.     Cranial Nerves: No cranial nerve deficit.      Results for orders placed or performed in visit on 07/16/24  POC Urinalysis Dipstick  Result Value Ref Range   Color, UA Yellow    Clarity, UA Clear     Glucose, UA Negative Negative   Bilirubin, UA Negative    Ketones, UA Negative    Spec Grav, UA 1.015 1.010 - 1.025   Blood, UA Negative    pH, UA 6.0 5.0 - 8.0   Protein, UA Negative Negative   Urobilinogen, UA 0.2 0.2 or 1.0 E.U./dL   Nitrite, UA Negative    Leukocytes, UA Negative Negative   Appearance     Odor      Last CBC Lab Results  Component Value Date   WBC 4.9 07/07/2024   HGB 12.6 07/07/2024   HCT 37.9 07/07/2024   MCV 96.4 07/07/2024  MCH 32.1 07/07/2024   RDW 12.3 07/07/2024   PLT 224 07/07/2024   Last metabolic panel Lab Results  Component Value Date   GLUCOSE 89 07/07/2024   NA 140 07/07/2024   K 3.9 07/07/2024   CL 102 07/07/2024   CO2 25 07/07/2024   BUN 16 07/07/2024   CREATININE 0.64 07/07/2024   GFRNONAA >60 07/07/2024   CALCIUM 9.8 07/07/2024   PROT 6.6 07/07/2024   ALBUMIN 4.3 07/07/2024   LABGLOB 2.5 11/18/2019   AGRATIO 1.8 11/18/2019   BILITOT 0.6 07/07/2024   ALKPHOS 67 07/07/2024   AST 23 07/07/2024   ALT 16 07/07/2024   ANIONGAP 13 07/07/2024   Last thyroid  functions Lab Results  Component Value Date   TSH 1.39 07/16/2024   T4TOTAL 6.6 11/18/2019      The ASCVD Risk score (Arnett DK, et al., 2019) failed to calculate for the following reasons:   The 2019 ASCVD risk score is only valid for ages 72 to 3    Assessment & Plan:   #1 recent left flank pain.  Repeat urine dipstick today is completely clear.  Recent imaging revealed no kidney stones.  Flank pain is essentially resolved.  Reassurance.  #2 recent occipital headache.  Question occipital neuralgia.  CT angiogram head and neck no acute abnormalities.  Patient currently on gabapentin  200 mg 3 times daily and headaches have diminished in severity but not resolved.  She has follow-up with neurologist Monday.  #3 fatigue.  Possibly related to disrupted sleep patterns as above.  She is having some urinary urgency.  Will check TSH and B12 level today.  She had recent  CBC and CMP which were unremarkable  #4 urine urgency/nocturia.  Urine dipstick today clear.  We have strongly urged that she try to scale back nighttime alcohol consumption.  Currently drinking 2 beers and 1 glass of wine most nights and we have recommend she scale that back first before considering medication options.  If still having significant urgency and nocturia after scaling back above consider medication options such as Myrbetriq  or Gemtesa.  #5 history of recurrent depression.  Currently has PHQ 9 score of 11 on Lexapro  10 mg daily.  This (depression) may be contributing some to her fatigue issues.  She has low motivation and loss of interest.  Will consider addition of low-dose Wellbutrin XL 150 mg daily to her Lexapro  and set up follow-up in 6 to 8 weeks to reassess and sooner as needed  Return in about 2 months (around 09/15/2024).    Wolm Scarlet, MD

## 2024-07-16 NOTE — Patient Instructions (Addendum)
 Try to scale back overall fluids after about 3-4 PM  Try to scale back alcohol use at night.   Set up 2 month follow up.

## 2024-07-17 ENCOUNTER — Ambulatory Visit: Payer: Self-pay | Admitting: Family Medicine

## 2024-07-18 NOTE — Progress Notes (Unsigned)
 NEUROLOGY CONSULTATION NOTE  Kelly George MRN: [id number] DOB: [birth year]  Referring provider: Wolm Scarlet, MD Primary care provider: Wolm Scarlet, MD  Reason for consult:  right sided occipital neuralgia  Assessment/Plan:   Right sided occipital neuralgia  Gabapentin  200mg  three times daily.   Refer to physical therapy for the neck If above therapy not adequately effective, we can increase dose of gabapentin  and/or refer for occipital nerve block Follow up in 6 months.   Subjective:  Kelly George is an 78 year old right-handed female with HLD, RBBB, anxiety and depression who presents for occipital neuralgia.  History supplemented by ED and referring provider's notes.  Patient accompanied by her daughter who supplements history.  Radiate on scalp sore, burning tingling  Strained neck - stiff to turn -  Helps but not get rid of it.   If 10/10 before, it would be a 2-3/10 now.    A little over 2 weeks ago, she began experiencing paroxysmal shooting pain from the right  upper cervical/suboccipital region radiating to the right ear, each lasting several seconds.  She also endorses a burning/tingling sensation and soreness over the scalp.  She has tightness in the neck limiting range of motion and neck movement could aggravate it.  No tinnitus, hearing loss or aural fullness.  No radiation into the right arm.  She denies any obvious acute injury but had been unpacking boxes after moving to another place, which may have caused strain.  She also finished a course of Keflex  for a kidney infection.  She followed up with primary care who did not see evidence of otitis media and was started on ibuprofen for cervicogenic musculoskeletal pain.  Due to ongoing pain, she went to the ED on 9/24 where CTA of head and neck performed revealed no etiology.  She was discharged on oxycodone -acetaminophen .  She was started on gabapentin  200mg  three times daily which has decreased the pain  significantly from 10/10 to 2-3/10 but still has soreness over her scalp.       PAST MEDICAL HISTORY: Past Medical History:  Diagnosis Date   Anxiety    Depression    Heart murmur    Hyperlipidemia    RBBB    Seasonal allergies     PAST SURGICAL HISTORY: Past Surgical History:  Procedure Laterality Date   BREAST BIOPSY Right 10/2020   x2   BREAST EXCISIONAL BIOPSY Left    COLONOSCOPY  07/07/2001   MASTECTOMY Right 11/2020   MASTECTOMY W/ SENTINEL NODE BIOPSY Right 11/28/2020   Procedure: RIGHT MASTECTOMY WITH RIGHT AXILLARY SENTINEL LYMPH NODE BIOPSY;  Surgeon: Ebbie Cough, MD;  Location: [facility];  Service: General;  Laterality: Right;  RNFA, PEC BLOCK   SKIN SURGERY     DERM - nasal    TOOTH EXTRACTION     TUBAL LIGATION      MEDICATIONS: Current Outpatient Medications on File Prior to Visit  Medication Sig Dispense Refill   anastrozole  (ARIMIDEX ) 1 MG tablet Take 1 tablet (1 mg total) by mouth daily. 90 tablet 3   Ascorbic Acid (VITAMIN C) 1000 MG tablet Take 1,000 mg by mouth daily.     buPROPion (WELLBUTRIN XL) 150 MG 24 hr tablet Take 1 tablet (150 mg total) by mouth daily. 30 tablet 5   CALCIUM CARBONATE-VITAMIN D  PO Take 2 tablets by mouth daily.     Cholecalciferol (VITAMIN D3) 2000 UNITS TABS Take 5,000 Units by mouth daily.      escitalopram  (  LEXAPRO ) 10 MG tablet TAKE 1 TABLET BY MOUTH EVERY DAY 90 tablet 0   gabapentin  (NEURONTIN ) 100 MG capsule Take 1 capsule (100 mg total) by mouth 3 (three) times daily. 90 capsule 0   glucosamine-chondroitin 500-400 MG tablet Take 1 tablet by mouth 2 (two) times daily.      ibuprofen (ADVIL) 200 MG tablet Take 200 mg by mouth every 6 (six) hours as needed. 2 tabs daily prn     Omega-3 Fatty Acids (EQL OMEGA 3 FISH OIL) 1000 MG CPDR Take 2 capsules by mouth daily.     simvastatin  (ZOCOR ) 10 MG tablet TAKE 1 TABLET BY MOUTH EVERY DAY AT NIGHT 90 tablet 1   No current facility-administered medications  on file prior to visit.    ALLERGIES: Allergies  Allergen Reactions   Penicillins Rash   Tetracyclines & Related Palpitations    FAMILY HISTORY: Family History  Problem Relation Age of Onset   Breast cancer Mother 70   COPD Mother    Diabetes Father    Cancer Father        Brain   Heart attack Father 65   Alcohol abuse Father    Depression Father    Uterine cancer Maternal Aunt    Hearing loss Maternal Grandmother    Hypertension Maternal Grandfather    Kidney disease Maternal Grandfather    Stroke Maternal Grandfather    High Cholesterol Brother    Hypertension Brother    Hyperlipidemia Brother    Autoimmune disease Daughter    Ankylosing spondylitis Daughter    Healthy Son    Depression Son     Objective:  Blood pressure 121/78, pulse 75, height 5' 8 (1.727 m), weight 174 lb (78.9 kg), SpO2 95%. General: No acute distress.  Patient appears well-groomed.   Head:  Normocephalic/atraumatic Eyes:  fundi examined but not visualized Neck: supple, mild right upper cervical paraspinal tenderness, decreased range of motion in all directions. Heart: regular rate and rhythm Neurological Exam: Mental status: alert and oriented to person, place, and time, speech fluent and not dysarthric, language intact. Cranial nerves: CN I: not tested CN II: pupils equal, round and reactive to light, visual fields intact CN III, IV, VI:  full range of motion, no nystagmus, no ptosis CN V: facial sensation intact. CN VII: upper and lower face symmetric CN VIII: hearing intact CN IX, X: gag intact, uvula midline CN XI: sternocleidomastoid and trapezius muscles intact CN XII: tongue midline Bulk & Tone: normal, no fasciculations. Motor:  muscle strength 5/5 throughout Sensation:  Pinprick sensation intact, vibratory sensation reduced in toes of left foot. Deep Tendon Reflexes:  2+ throughout,  toes downgoing.   Finger to nose testing:  Without dysmetria.   Gait:  Normal station and  stride.  Romberg negative.    Thank you for allowing me to take part in the care of this patient.  Juliene Dunnings, DO  CC: Wolm Scarlet, MD

## 2024-07-19 ENCOUNTER — Ambulatory Visit (INDEPENDENT_AMBULATORY_CARE_PROVIDER_SITE_OTHER): Admitting: Neurology

## 2024-07-19 ENCOUNTER — Encounter: Payer: Self-pay | Admitting: Neurology

## 2024-07-19 VITALS — BP 121/78 | HR 75 | Ht 68.0 in | Wt 174.0 lb

## 2024-07-19 DIAGNOSIS — M5481 Occipital neuralgia: Secondary | ICD-10-CM | POA: Diagnosis not present

## 2024-07-19 NOTE — Patient Instructions (Signed)
 Continue gabapentin  200mg  three times daily for now.  Contact me if you feel we need to increase dose. Will refer you to physical therapy for neck as this is likely a major trigger for the pain.   Follow up in 6 months.   Back of the Head Pain (Occipital Neuralgia): What to Know  Occipital neuralgia is a headache that causes very bad pain in the back of the head. This pain may spread to other parts of the head. The pain may be caused by irritation of the nerves in the spinal cord. The nerves are found just below the base of the head. These nerves transmit feeling from the back of the head, the top of the head, and the areas behind the ears. What are the causes? This headache can happen without any cause. This is known as primary headache syndrome. In some cases, the pain may be caused by problems that irritate or put pressure on the nerves. These include: A muscle spasm in the neck. Neck injury. Arthritis, or wear and tear of the neck bones. Disease of the disks that separate the neck bones. Swollen blood vessels that put pressure on the nerves. Infections. Tumors. What are the signs or symptoms? This problem causes brief pain that feels like burning or stabbing, or like an electric shooting pain in the back of the head. This pain may spread to the top of the head. It can happen on one side or both sides of the head. You may also feel: Pain behind the eye. Pain when you move your neck, or when you brush your hair. Soreness in the scalp. Aching in the back of the head between times of very bad pain. Pain that gets worse when you're around bright lights. How is this diagnosed?  This problem may be diagnosed based on your symptoms and a physical exam. A health care provider may put pressure on certain nerves in the neck to find where the pain is. Other tests may also be done, such as: MRI or CT scan of the brain and neck. Shots of medicine into the nerves to numb the area and see if the  pain goes away. This is called a nerve block. How is this treated? Treatment for this condition may begin with rest, massage, and over-the-counter pain medicine. You may also use an ice pack or a heating pad. If these treatments don't work, you may need stronger medicines that are prescribed by your provider. These include: Medicines to treat swelling. Medicines to relax your muscles. Medicines for seizures that also treat pain. Medicines for depression that also treat pain. Shots to numb the area and lessen swelling. These are called anesthetics or steroids. Pulsed radiofrequency ablation. This is when wires are used to give electrical signals that block pain from the nerves. Surgery to relieve pressure on the nerves. Physical therapy. Follow these instructions at home: Managing pain     Stop activities that cause pain, if possible. Rest when you have an attack of pain. Try gentle massage to decrease the pain. Try a different pillow or sleeping position. Use heat as told. Use the heat source that your provider recommends, such as a moist heat pack or a heating pad. Do this as often as told. Place a towel between your skin and the heat source. Leave the heat on for 20-30 minutes. Use ice or an ice pack as told. Place a towel between your skin and the ice. Leave the ice on for 20 minutes, 2-3  times a day. If your skin turns red, take off the ice or heat right away to prevent skin damage. The risk of damage is higher if you can't feel pain, heat, or cold. General instructions Take your medicines only as told. Avoid things that make your pain worse, like bright lights. Try to stay active. Get regular exercise that doesn't cause pain. Ask your provider to suggest safe exercises for you. Work with a physical therapist to learn stretching exercises you can do at home. Use good posture. Contact a health care provider if: Your medicine is not working. You have new or worse symptoms. Get  help right away if: You have very bad head pain that doesn't go away. You have a sudden change in vision, balance, or speech. These symptoms may be an emergency. Call 911 right away. Do not wait to see if the symptoms will go away. Do not drive yourself to the hospital. This information is not intended to replace advice given to you by your health care provider. Make sure you discuss any questions you have with your health care provider. Document Revised: 07/04/2023 Document Reviewed: 07/04/2023 Elsevier Patient Education  2025 [employer].

## 2024-07-27 ENCOUNTER — Encounter: Payer: Self-pay | Admitting: Family Medicine

## 2024-07-27 MED ORDER — LOSARTAN POTASSIUM 50 MG PO TABS: 50.0000 mg | ORAL_TABLET | Freq: Every day | ORAL | 0 refills | Status: DC

## 2024-08-03 ENCOUNTER — Ambulatory Visit

## 2024-08-03 NOTE — Therapy (Signed)
 OUTPATIENT PHYSICAL THERAPY CERVICAL EVALUATION   Patient Name: Kelly George MRN: [id number] DOB:25-Jun-1946, 78 y.o., female Today's Date: 08/04/2024  END OF SESSION:  PT End of Session - 08/04/24 1150     Visit Number 1    Number of Visits 13    Date for Recertification  09/15/24    Authorization Type Medicare/Generic Supplemental    PT Start Time 1105    PT Stop Time 1139    PT Time Calculation (min) 34 min    Activity Tolerance Patient tolerated treatment well    Behavior During Therapy [facility] for tasks assessed/performed          Past Medical History:  Diagnosis Date   Anxiety    Depression    Heart murmur    Hyperlipidemia    RBBB    Seasonal allergies    Past Surgical History:  Procedure Laterality Date   BREAST BIOPSY Right 10/2020   x2   BREAST EXCISIONAL BIOPSY Left    COLONOSCOPY  07/07/2001   MASTECTOMY Right 11/2020   MASTECTOMY W/ SENTINEL NODE BIOPSY Right 11/28/2020   Procedure: RIGHT MASTECTOMY WITH RIGHT AXILLARY SENTINEL LYMPH NODE BIOPSY;  Surgeon: Ebbie Cough, MD;  Location: [facility];  Service: General;  Laterality: Right;  RNFA, PEC BLOCK   SKIN SURGERY     DERM - nasal    TOOTH EXTRACTION     TUBAL LIGATION     Patient Active Problem List   Diagnosis Date Noted   Neck pain 07/05/2024   Hematuria 07/05/2024   Closed compression fracture of L5 vertebra (HCC) 10/17/2023   Breast cancer, right (HCC) 11/28/2020   Malignant neoplasm of upper-outer quadrant of right breast in female, estrogen receptor positive (HCC) 11/10/2020   Osteoarthritis of multiple joints 04/03/2020   Aortic atherosclerosis 12/03/2018   Dyslipidemia 12/03/2018   Precordial chest pain 07/07/2015   Pain in joint of right foot 05/16/2014   Vitamin D  deficiency 07/08/2013   Mitral valve prolapse syndrome, history of mild 01/05/2013   Seasonal allergies    Anxiety    Depression, recurrent    Hyperlipidemia     PCP: Micheal Wolm ORN,  MD   REFERRING PROVIDER: Skeet Juliene SAUNDERS, DO  REFERRING DIAG: [phone] (ICD-10-CM) - Occipital neuralgia of right side  THERAPY DIAG:  Cervicalgia  History of falling  Other abnormalities of gait and mobility  Rationale for Evaluation and Treatment: Rehabilitation  ONSET DATE: 1 month  SUBJECTIVE:  SUBJECTIVE STATEMENT: Patient reports that she has been having pain in the R side of the neck radiating to the top of the head for the past month. Notes some benefit from gabapentin . Now it mostly hurts when turning head to the R. Denies N/T or radiation down the UEs. Pain was preventing her from sleeping or participating in daily activities, but not as much now. Difficulty turning head to drive. Reports some issues with balance which worsened d/t taking gabapentin . She has since stopped taking this.  Has had to stop riding her horse d/t balance challenges.    Hand dominance: Right  PERTINENT HISTORY:  Anxiety, depression, HLD, RBBB, R mastectomy  PAIN:  Are you having pain? No  PRECAUTIONS: Fall  RED FLAGS: None     WEIGHT BEARING RESTRICTIONS: No  FALLS:  Has patient fallen in last 6 months? Yes. Number of falls 3.   LIVING ENVIRONMENT: Lives with: lives alone Lives in: House/apartment Stairs: none Has following equipment at home: Grab bars  OCCUPATION: retired  PLOF: Independent  PATIENT GOALS: improve pain and be able to turn my neck  NEXT MD VISIT: 01/20/25  OBJECTIVE:  Note: Objective measures were completed at Evaluation unless otherwise noted.  DIAGNOSTIC FINDINGS:  07/07/24 head neck CT angio: No evidence of acute intracranial abnormality. 2. Moderate chronic small vessel ischemic disease. 3. Mild atherosclerosis in the head and neck without a large  vessel occlusion. 4. Severe left P2 stenosis.   PATIENT SURVEYS:  NDI:13/50, 26%  COGNITION: Overall cognitive status: Within functional limits for tasks assessed  SENSATION: Pt reports occasional N/T in fingertips while driving  POSTURE: shoulders elevated and neck slightly flexed with reduced lordosis   PALPATION: TTP over B scalenes and L UT; soft tissue restriction throughout cervical musculature    CERVICAL ROM:   Active ROM AROM (deg) eval  Flexion 39  Extension 25 *c/o pain  Right lateral flexion 15  Left lateral flexion 14  Right rotation 27 *c/o tight  Left rotation 22   (Blank rows = not tested)   UPPER EXTREMITY ROM:  Active ROM Right eval Left eval  Shoulder flexion [facility]  Shoulder extension    Shoulder abduction    Shoulder adduction    Shoulder extension    Shoulder internal rotation ~ T10 ~ T10  Shoulder external rotation Back of head Back of head  Elbow flexion    Elbow extension    Wrist flexion    Wrist extension    Wrist ulnar deviation    Wrist radial deviation    Wrist pronation    Wrist supination     (Blank rows = not tested)  UPPER EXTREMITY MMT:  MMT Right eval Left eval  Shoulder flexion 4+ 4  Shoulder extension    Shoulder abduction 4- 4-  Shoulder adduction    Shoulder extension    Shoulder internal rotation 4- 4-  Shoulder external rotation 4- 4-  Middle trapezius    Lower trapezius    Elbow flexion    Elbow extension    Wrist flexion    Wrist extension    Wrist ulnar deviation    Wrist radial deviation    Wrist pronation    Wrist supination    Grip strength     (Blank rows = not tested)   Gait: Slightly unsteady and antalgic on R LE  TREATMENT DATE: 08/04/24  PATIENT EDUCATION:  Education details: prognosis, POC, HEP, edu on benefits of balance training to avoid falls and  injury Person educated: Patient Education method: Explanation, Demonstration, Tactile cues, Verbal cues, and Handouts Education comprehension: verbalized understanding and returned demonstration  HOME EXERCISE PROGRAM: Access Code: [email or web address] URL: [email or web address] Date: 08/04/2024 Prepared by: [facility] - [facility]  Exercises - Seated Assisted Cervical Rotation with Towel  - 1 x daily - 5 x weekly - 2 sets - 10 reps - Mid-Lower Cervical Extension SNAG with Strap  - 1 x daily - 5 x weekly - 2 sets - 10 reps - Seated Bilateral Shoulder External Rotation with Resistance  - 1 x daily - 5 x weekly - 2 sets - 10 reps - Seated Shoulder Horizontal Abduction with Resistance  - 1 x daily - 5 x weekly - 2 sets - 10 reps   ASSESSMENT:  CLINICAL IMPRESSION:  Patient is an 78 y/o F presenting to OPPT with c/o R sided neck pain and HAs for the past month as well as chronic imbalance and hx of falls. Patient today presenting with Guarded posture, TTP and tightness over cervical musculature, marked stiffness with cervical AROM, limited shoulder ROM, shoulder weakness, and gait deviations.. Patient was educated on gentle ROM and strengthening HEP and reported understanding. Would benefit from skilled PT services 1-2 x/week for 6 weeks to address aforementioned impairments in order to optimize level of function.    OBJECTIVE IMPAIRMENTS: Abnormal gait, decreased activity tolerance, decreased balance, difficulty walking, decreased ROM, decreased strength, increased muscle spasms, impaired flexibility, postural dysfunction, and pain.   ACTIVITY LIMITATIONS: carrying, lifting, bending, standing, squatting, sleeping, stairs, transfers, bathing, reach over head, hygiene/grooming, and locomotion level  PARTICIPATION LIMITATIONS: meal prep, cleaning, laundry, driving, shopping, community activity, and riding horse  PERSONAL FACTORS: Age, Past/current experiences, Time  since onset of injury/illness/exacerbation, and 3+ comorbidities: Anxiety, depression, HLD, RBBB, R mastectomy are also affecting patient's functional outcome.   REHAB POTENTIAL: Good  CLINICAL DECISION MAKING: Evolving/moderate complexity  EVALUATION COMPLEXITY: Moderate   GOALS: Goals reviewed with patient? Yes  SHORT TERM GOALS: Target date: 08/25/2024  Patient to be independent with initial HEP. Baseline: HEP initiated Goal status: INITIAL    LONG TERM GOALS: Target date: 09/15/2024  Patient to be independent with advanced HEP. Baseline: Not yet initiated  Goal status: INITIAL  Patient to demonstrate B LE strength >/=4+/5.  Baseline: See above Goal status: INITIAL  Patient to demonstrate cervical AROM improved by 10 degrees in all planes.  Baseline: see above Goal status: INITIAL  Patient to report resolution of neck pain. Baseline: has pain Goal status: INITIAL  Patient to score at least 23/30 on FGA in order to decrease risk of falls.  Baseline: NT Goal status: INITIAL  Patient to complete 5xSTS in <15 sec with LRAD in order to decrease risk of falls.   Baseline: NT Goal status: INITIAL    PLAN:  PT FREQUENCY: 1-2x/week  PT DURATION: 6 weeks  PLANNED INTERVENTIONS: [id number]- PT Re-evaluation, 97110-Therapeutic exercises, 97530- Therapeutic activity, [id number]- Neuromuscular re-education, 97535- Self Care, [id number]- Manual therapy, [phone]- Gait training, [phone]- Canalith repositioning, [phone] (1-2 muscles), [id number] (3+ muscles)- Dry Needling, Patient/Family education, Balance training, Stair training, Taping, Joint mobilization, Spinal mobilization, Vestibular training, Cryotherapy, and Moist heat  PLAN FOR NEXT SESSION: review HEP, FGA, 5xSTS and create goals; may benefit from manual therapy    Louana Terrilyn Christians, PT, DPT 08/04/24 12:02 PM  [facility]  Neuro [address], [address] [place], [place] [address] Phone #  [phone] Fax # [phone]

## 2024-08-04 ENCOUNTER — Encounter: Payer: Self-pay | Admitting: Physical Therapy

## 2024-08-04 ENCOUNTER — Other Ambulatory Visit: Payer: Self-pay

## 2024-08-04 ENCOUNTER — Ambulatory Visit: Attending: Neurology | Admitting: Physical Therapy

## 2024-08-04 DIAGNOSIS — R2689 Other abnormalities of gait and mobility: Secondary | ICD-10-CM | POA: Diagnosis not present

## 2024-08-04 DIAGNOSIS — M5481 Occipital neuralgia: Secondary | ICD-10-CM | POA: Diagnosis not present

## 2024-08-04 DIAGNOSIS — Z9181 History of falling: Secondary | ICD-10-CM | POA: Diagnosis not present

## 2024-08-04 DIAGNOSIS — M542 Cervicalgia: Secondary | ICD-10-CM | POA: Diagnosis not present

## 2024-08-07 ENCOUNTER — Other Ambulatory Visit: Payer: Self-pay | Admitting: Family Medicine

## 2024-08-11 ENCOUNTER — Ambulatory Visit

## 2024-08-11 DIAGNOSIS — M542 Cervicalgia: Secondary | ICD-10-CM | POA: Diagnosis not present

## 2024-08-11 DIAGNOSIS — Z9181 History of falling: Secondary | ICD-10-CM | POA: Diagnosis not present

## 2024-08-11 DIAGNOSIS — R2689 Other abnormalities of gait and mobility: Secondary | ICD-10-CM

## 2024-08-11 DIAGNOSIS — M5481 Occipital neuralgia: Secondary | ICD-10-CM | POA: Diagnosis not present

## 2024-08-11 NOTE — Therapy (Signed)
 OUTPATIENT PHYSICAL THERAPY CERVICAL TREATMENT   Patient Name: Kelly George MRN: [id number] DOB:March 29, 1946, 78 y.o., female Today's Date: 08/11/2024  END OF SESSION:  PT End of Session - 08/11/24 1018     Visit Number 2    Number of Visits 13    Date for Recertification  09/15/24    Authorization Type Medicare/Generic Supplemental    PT Start Time 1017    PT Stop Time 1100    PT Time Calculation (min) 43 min    Activity Tolerance Patient tolerated treatment well    Behavior During Therapy [facility] for tasks assessed/performed          Past Medical History:  Diagnosis Date   Anxiety    Depression    Heart murmur    Hyperlipidemia    RBBB    Seasonal allergies    Past Surgical History:  Procedure Laterality Date   BREAST BIOPSY Right 10/2020   x2   BREAST EXCISIONAL BIOPSY Left    COLONOSCOPY  07/07/2001   MASTECTOMY Right 11/2020   MASTECTOMY W/ SENTINEL NODE BIOPSY Right 11/28/2020   Procedure: RIGHT MASTECTOMY WITH RIGHT AXILLARY SENTINEL LYMPH NODE BIOPSY;  Surgeon: Ebbie Cough, MD;  Location: [facility];  Service: General;  Laterality: Right;  RNFA, PEC BLOCK   SKIN SURGERY     DERM - nasal    TOOTH EXTRACTION     TUBAL LIGATION     Patient Active Problem List   Diagnosis Date Noted   Neck pain 07/05/2024   Hematuria 07/05/2024   Closed compression fracture of L5 vertebra (HCC) 10/17/2023   Breast cancer, right (HCC) 11/28/2020   Malignant neoplasm of upper-outer quadrant of right breast in female, estrogen receptor positive (HCC) 11/10/2020   Osteoarthritis of multiple joints 04/03/2020   Aortic atherosclerosis 12/03/2018   Dyslipidemia 12/03/2018   Precordial chest pain 07/07/2015   Pain in joint of right foot 05/16/2014   Vitamin D  deficiency 07/08/2013   Mitral valve prolapse syndrome, history of mild 01/05/2013   Seasonal allergies    Anxiety    Depression, recurrent    Hyperlipidemia     PCP: Micheal Wolm ORN,  MD   REFERRING PROVIDER: Skeet Juliene SAUNDERS, DO  REFERRING DIAG: [phone] (ICD-10-CM) - Occipital neuralgia of right side  THERAPY DIAG:  Cervicalgia  History of falling  Other abnormalities of gait and mobility  Rationale for Evaluation and Treatment: Rehabilitation  ONSET DATE: 1 month  SUBJECTIVE:  SUBJECTIVE STATEMENT: Neck doesn't bother too much when laying down. No dizziness or lightheadedness but increased radiating pain with extension. Balance issues have been present longer than neck pain.     Hand dominance: Right  PERTINENT HISTORY:  Anxiety, depression, HLD, RBBB, R mastectomy  PAIN:  Are you having pain? No, just discomfort  PRECAUTIONS: Fall  RED FLAGS: None     WEIGHT BEARING RESTRICTIONS: No  FALLS:  Has patient fallen in last 6 months? Yes. Number of falls 3.   LIVING ENVIRONMENT: Lives with: lives alone Lives in: House/apartment Stairs: none Has following equipment at home: Grab bars  OCCUPATION: retired  PLOF: Independent  PATIENT GOALS: improve pain and be able to turn my neck  NEXT MD VISIT: 01/20/25  OBJECTIVE:   TODAY'S TREATMENT: 08/11/24 Activity Comments  5xSTS 21 sec  FGA 21/30  HEP review Multimodal cues  Manual therapy -upper cervical rotation: significant limitations R > L w/ pain to right rotation -joint mobilization cervical spine grade 2-3 for improving ROM for extension and lateral flexion and attempts for lateral shift/glide but with increased discomfort.  Manual traction Suboccipital release with active upper cervical flexion/extension           [facility] PT Assessment - 08/11/24 0001       Standardized Balance Assessment   Standardized Balance Assessment Five Times Sit to Stand    Five times sit to stand comments  21       Functional Gait  Assessment   Gait assessed  Yes    Gait Level Surface Walks 20 ft in less than 7 sec but greater than 5.5 sec, uses assistive device, slower speed, mild gait deviations, or deviates 6-10 in outside of the 12 in walkway width.   10 meter: 2.7 ft/sec   Change in Gait Speed Able to smoothly change walking speed without loss of balance or gait deviation. Deviate no more than 6 in outside of the 12 in walkway width.    Gait with Horizontal Head Turns Performs head turns smoothly with slight change in gait velocity (eg, minor disruption to smooth gait path), deviates 6-10 in outside 12 in walkway width, or uses an assistive device.    Gait with Vertical Head Turns Performs task with slight change in gait velocity (eg, minor disruption to smooth gait path), deviates 6 - 10 in outside 12 in walkway width or uses assistive device    Gait and Pivot Turn Pivot turns safely within 3 sec and stops quickly with no loss of balance.    Step Over Obstacle Is able to step over 2 stacked shoe boxes taped together (9 in total height) without changing gait speed. No evidence of imbalance.    Gait with Narrow Base of Support Ambulates less than 4 steps heel to toe or cannot perform without assistance.    Gait with Eyes Closed Walks 20 ft, slow speed, abnormal gait pattern, evidence for imbalance, deviates 10-15 in outside 12 in walkway width. Requires more than 9 sec to ambulate 20 ft.    Ambulating Backwards Walks 20 ft, uses assistive device, slower speed, mild gait deviations, deviates 6-10 in outside 12 in walkway width.    Steps Alternating feet, no rail.    Total Score 21          Note: Objective measures were completed at Evaluation unless otherwise noted.  DIAGNOSTIC FINDINGS:  07/07/24 head neck CT angio: No evidence of acute intracranial abnormality. 2. Moderate chronic small vessel ischemic disease. 3. Mild  atherosclerosis in the head and neck without a large vessel occlusion. 4.  Severe left P2 stenosis.   PATIENT SURVEYS:  NDI:13/50, 26%  COGNITION: Overall cognitive status: Within functional limits for tasks assessed  SENSATION: Pt reports occasional N/T in fingertips while driving  POSTURE: shoulders elevated and neck slightly flexed with reduced lordosis   PALPATION: TTP over B scalenes and L UT; soft tissue restriction throughout cervical musculature    CERVICAL ROM:   Active ROM AROM (deg) eval  Flexion 39  Extension 25 *c/o pain  Right lateral flexion 15  Left lateral flexion 14  Right rotation 27 *c/o tight  Left rotation 22   (Blank rows = not tested)   UPPER EXTREMITY ROM:  Active ROM Right eval Left eval  Shoulder flexion [facility]  Shoulder extension    Shoulder abduction    Shoulder adduction    Shoulder extension    Shoulder internal rotation ~ T10 ~ T10  Shoulder external rotation Back of head Back of head  Elbow flexion    Elbow extension    Wrist flexion    Wrist extension    Wrist ulnar deviation    Wrist radial deviation    Wrist pronation    Wrist supination     (Blank rows = not tested)  UPPER EXTREMITY MMT:  MMT Right eval Left eval  Shoulder flexion 4+ 4  Shoulder extension    Shoulder abduction 4- 4-  Shoulder adduction    Shoulder extension    Shoulder internal rotation 4- 4-  Shoulder external rotation 4- 4-  Middle trapezius    Lower trapezius    Elbow flexion    Elbow extension    Wrist flexion    Wrist extension    Wrist ulnar deviation    Wrist radial deviation    Wrist pronation    Wrist supination    Grip strength     (Blank rows = not tested)   Gait: Slightly unsteady and antalgic on R LE  TREATMENT DATE: 08/04/24                                                                                                                                 PATIENT EDUCATION:  Education details: prognosis, POC, HEP, edu on benefits of balance training to avoid falls and injury Person educated:  Patient Education method: Programmer, Multimedia, Demonstration, Tactile cues, Verbal cues, and Handouts Education comprehension: verbalized understanding and returned demonstration  HOME EXERCISE PROGRAM: Access Code: [email or web address] URL: [email or web address] Date: 08/04/2024 Prepared by: [facility] - Outpatient  Rehab - [facility]  Exercises - Seated Assisted Cervical Rotation with Towel  - 1 x daily - 5 x weekly - 2 sets - 10 reps - Mid-Lower Cervical Extension SNAG with Strap  - 1 x daily - 5 x weekly - 2 sets - 10 reps - Seated Bilateral Shoulder External Rotation with Resistance  - 1 x daily - 5 x  weekly - 2 sets - 10 reps - Seated Shoulder Horizontal Abduction with Resistance  - 1 x daily - 5 x weekly - 2 sets - 10 reps   ASSESSMENT:  CLINICAL IMPRESSION: Balance assessments of 5xSTS and Functional Gait Assessment revealing for high risk for falls and LE weakness with difficulty under single limb support demands and sensory deprived aspects.  HEP review requiring hand-over-hand cues for proper sequence and execution.  Manual therapy to address neck pain and limited ROM with significant limited upper cervical rotation appreciated and provocative of pain response to right rotation.  Pt would benefit from ongoing sessions to address cervical spine and balance issues.    OBJECTIVE IMPAIRMENTS: Abnormal gait, decreased activity tolerance, decreased balance, difficulty walking, decreased ROM, decreased strength, increased muscle spasms, impaired flexibility, postural dysfunction, and pain.   ACTIVITY LIMITATIONS: carrying, lifting, bending, standing, squatting, sleeping, stairs, transfers, bathing, reach over head, hygiene/grooming, and locomotion level  PARTICIPATION LIMITATIONS: meal prep, cleaning, laundry, driving, shopping, community activity, and riding horse  PERSONAL FACTORS: Age, Past/current experiences, Time since onset of injury/illness/exacerbation, and 3+ comorbidities:  Anxiety, depression, HLD, RBBB, R mastectomy are also affecting patient's functional outcome.   REHAB POTENTIAL: Good  CLINICAL DECISION MAKING: Evolving/moderate complexity  EVALUATION COMPLEXITY: Moderate   GOALS: Goals reviewed with patient? Yes  SHORT TERM GOALS: Target date: 08/25/2024  Patient to be independent with initial HEP. Baseline: HEP initiated Goal status: INITIAL    LONG TERM GOALS: Target date: 09/15/2024  Patient to be independent with advanced HEP. Baseline: Not yet initiated  Goal status: INITIAL  Patient to demonstrate B LE strength >/=4+/5.  Baseline: See above Goal status: INITIAL  Patient to demonstrate cervical AROM improved by 10 degrees in all planes.  Baseline: see above Goal status: INITIAL  Patient to report resolution of neck pain. Baseline: has pain Goal status: INITIAL  Patient to score at least 23/30 on FGA in order to decrease risk of falls.  Baseline: 21/30 Goal status: INITIAL  Patient to complete 5xSTS in <15 sec with LRAD in order to decrease risk of falls.   Baseline: 21 sec Goal status: INITIAL    PLAN:  PT FREQUENCY: 1-2x/week  PT DURATION: 6 weeks  PLANNED INTERVENTIONS: 97164- PT Re-evaluation, 97110-Therapeutic exercises, 97530- Therapeutic activity, [id number]- Neuromuscular re-education, 97535- Self Care, 02859- Manual therapy, [id number]- Gait training, [phone]- Canalith repositioning, [phone] (1-2 muscles), [id number] (3+ muscles)- Dry Needling, Patient/Family education, Balance training, Stair training, Taping, Joint mobilization, Spinal mobilization, Vestibular training, Cryotherapy, and Moist heat  PLAN FOR NEXT SESSION: review HEP, Fmay benefit from manual therapy additional and progressing self-mobilization strategies    12:43 PM, 08/11/24 M. Kelly Freman Lapage, PT, DPT Physical Therapist- [place] Office Number: [phone]

## 2024-08-13 ENCOUNTER — Ambulatory Visit: Admitting: Physical Therapy

## 2024-08-13 ENCOUNTER — Encounter: Payer: Self-pay | Admitting: Physical Therapy

## 2024-08-13 DIAGNOSIS — Z9181 History of falling: Secondary | ICD-10-CM | POA: Diagnosis not present

## 2024-08-13 DIAGNOSIS — R2689 Other abnormalities of gait and mobility: Secondary | ICD-10-CM | POA: Diagnosis not present

## 2024-08-13 DIAGNOSIS — M542 Cervicalgia: Secondary | ICD-10-CM

## 2024-08-13 DIAGNOSIS — M5481 Occipital neuralgia: Secondary | ICD-10-CM | POA: Diagnosis not present

## 2024-08-13 NOTE — Therapy (Signed)
 OUTPATIENT PHYSICAL THERAPY CERVICAL TREATMENT   Patient Name: Kelly George MRN: [id number] DOB:09-Sep-1946, 78 y.o., female Today's Date: 08/13/2024  END OF SESSION:  PT End of Session - 08/13/24 1018     Visit Number 3    Number of Visits 13    Date for Recertification  09/15/24    Authorization Type Medicare/Generic Supplemental    PT Start Time 1019    PT Stop Time 1057    PT Time Calculation (min) 38 min    Activity Tolerance Patient tolerated treatment well    Behavior During Therapy WFL for tasks assessed/performed           Past Medical History:  Diagnosis Date   Anxiety    Depression    Heart murmur    Hyperlipidemia    RBBB    Seasonal allergies    Past Surgical History:  Procedure Laterality Date   BREAST BIOPSY Right 10/2020   x2   BREAST EXCISIONAL BIOPSY Left    COLONOSCOPY  07/07/2001   MASTECTOMY Right 11/2020   MASTECTOMY W/ SENTINEL NODE BIOPSY Right 11/28/2020   Procedure: RIGHT MASTECTOMY WITH RIGHT AXILLARY SENTINEL LYMPH NODE BIOPSY;  Surgeon: Ebbie Cough, MD;  Location: [facility];  Service: General;  Laterality: Right;  RNFA, PEC BLOCK   SKIN SURGERY     DERM - nasal    TOOTH EXTRACTION     TUBAL LIGATION     Patient Active Problem List   Diagnosis Date Noted   Neck pain 07/05/2024   Hematuria 07/05/2024   Closed compression fracture of L5 vertebra (HCC) 10/17/2023   Breast cancer, right (HCC) 11/28/2020   Malignant neoplasm of upper-outer quadrant of right breast in female, estrogen receptor positive (HCC) 11/10/2020   Osteoarthritis of multiple joints 04/03/2020   Aortic atherosclerosis 12/03/2018   Dyslipidemia 12/03/2018   Precordial chest pain 07/07/2015   Pain in joint of right foot 05/16/2014   Vitamin D  deficiency 07/08/2013   Mitral valve prolapse syndrome, history of mild 01/05/2013   Seasonal allergies    Anxiety    Depression, recurrent    Hyperlipidemia     PCP: Micheal Wolm ORN,  MD   REFERRING PROVIDER: Skeet Juliene SAUNDERS, DO  REFERRING DIAG: [phone] (ICD-10-CM) - Occipital neuralgia of right side  THERAPY DIAG:  Cervicalgia  Rationale for Evaluation and Treatment: Rehabilitation  ONSET DATE: 1 month  SUBJECTIVE:  SUBJECTIVE STATEMENT: Been doing the exercises.  Notice my balance is off, and that's why I haven't been able to ride my horse.   Hand dominance: Right  PERTINENT HISTORY:  Anxiety, depression, HLD, RBBB, R mastectomy  PAIN:  Are you having pain? Yes: NPRS scale: 1/10 Pain location: neck Pain description: discomfort Aggravating factors: turning head Relieving factors: relax, head straight, ibuprofen  PRECAUTIONS: Fall  RED FLAGS: None     WEIGHT BEARING RESTRICTIONS: No  FALLS:  Has patient fallen in last 6 months? Yes. Number of falls 3.   LIVING ENVIRONMENT: Lives with: lives alone Lives in: House/apartment Stairs: none Has following equipment at home: Grab bars  OCCUPATION: retired  PLOF: Independent  PATIENT GOALS: improve pain and be able to turn my neck  NEXT MD VISIT: 01/20/25  OBJECTIVE:    TODAY'S TREATMENT: 08/13/2024 Activity Comments  HEP review   Good motion Good form  Seated neck retraction with towel 2 x 5 reps, 5 sec hold Good stretch  Seated resisted scapular retraction 10 reps    STM to R UT with tenderness, tightness noted Instructed in use of theracane for self massage Notes relief with STM  BaLance work: SLS with alt step taps 2 x 10 Romberg stance EO/EC 2 x 30 sec Sidestep R/L over hurdle x 10 Cues for light UE support        Access Code: [email or web address] URL: [email or web address] Date: 08/13/2024 Prepared by: [facility] - Outpatient  Rehab - [facility]  Exercises - Seated  Assisted Cervical Rotation with Towel  - 1 x daily - 5 x weekly - 2 sets - 10 reps - Mid-Lower Cervical Extension SNAG with Strap  - 1 x daily - 5 x weekly - 2 sets - 10 reps - Seated Bilateral Shoulder External Rotation with Resistance  - 1 x daily - 5 x weekly - 2 sets - 10 reps - Seated Shoulder Horizontal Abduction with Resistance  - 1 x daily - 5 x weekly - 2 sets - 10 reps - Standing Forward Step Taps with Counter Support  - 1 x daily - 5 x weekly - 2 sets - 10 reps - Narrow Stance with Counter Support  - 1 x daily - 5 x weekly - 1 sets - 3 reps - 15-30 sec hold - Side Stepping with Counter Support  - 1 x daily - 5 x weekly - 1 sets - 3 reps  PATIENT EDUCATION: Education details: HEP additions, possibility of therapy cane/massage cane for self-massage Person educated: Patient Education method: Explanation, Demonstration, and Handouts Education comprehension: verbalized understanding and returned demonstration    Note: Objective measures were completed at Evaluation unless otherwise noted.  DIAGNOSTIC FINDINGS:  07/07/24 head neck CT angio: No evidence of acute intracranial abnormality. 2. Moderate chronic small vessel ischemic disease. 3. Mild atherosclerosis in the head and neck without a large vessel occlusion. 4. Severe left P2 stenosis.   PATIENT SURVEYS:  NDI:13/50, 26%  COGNITION: Overall cognitive status: Within functional limits for tasks assessed  SENSATION: Pt reports occasional N/T in fingertips while driving  POSTURE: shoulders elevated and neck slightly flexed with reduced lordosis   PALPATION: TTP over B scalenes and L UT; soft tissue restriction throughout cervical musculature    CERVICAL ROM:   Active ROM AROM (deg) eval  Flexion 39  Extension 25 *c/o pain  Right lateral flexion 15  Left lateral flexion 14  Right rotation 27 *c/o tight  Left rotation 22   (Blank rows =  not tested)   UPPER EXTREMITY ROM:  Active ROM Right eval Left eval   Shoulder flexion [facility]  Shoulder extension    Shoulder abduction    Shoulder adduction    Shoulder extension    Shoulder internal rotation ~ T10 ~ T10  Shoulder external rotation Back of head Back of head  Elbow flexion    Elbow extension    Wrist flexion    Wrist extension    Wrist ulnar deviation    Wrist radial deviation    Wrist pronation    Wrist supination     (Blank rows = not tested)  UPPER EXTREMITY MMT:  MMT Right eval Left eval  Shoulder flexion 4+ 4  Shoulder extension    Shoulder abduction 4- 4-  Shoulder adduction    Shoulder extension    Shoulder internal rotation 4- 4-  Shoulder external rotation 4- 4-  Middle trapezius    Lower trapezius    Elbow flexion    Elbow extension    Wrist flexion    Wrist extension    Wrist ulnar deviation    Wrist radial deviation    Wrist pronation    Wrist supination    Grip strength     (Blank rows = not tested)   Gait: Slightly unsteady and antalgic on R LE  TREATMENT DATE: 08/04/24                                                                                                                                 PATIENT EDUCATION:  Education details: prognosis, POC, HEP, edu on benefits of balance training to avoid falls and injury Person educated: Patient Education method: Programmer, Multimedia, Demonstration, Tactile cues, Verbal cues, and Handouts Education comprehension: verbalized understanding and returned demonstration  HOME EXERCISE PROGRAM: Access Code: [email or web address] URL: [email or web address] Date: 08/04/2024 Prepared by: [facility] - Outpatient  Rehab - [facility]  Exercises - Seated Assisted Cervical Rotation with Towel  - 1 x daily - 5 x weekly - 2 sets - 10 reps - Mid-Lower Cervical Extension SNAG with Strap  - 1 x daily - 5 x weekly - 2 sets - 10 reps - Seated Bilateral Shoulder External Rotation with Resistance  - 1 x daily - 5 x weekly - 2 sets - 10 reps - Seated Shoulder Horizontal  Abduction with Resistance  - 1 x daily - 5 x weekly - 2 sets - 10 reps   ASSESSMENT:  CLINICAL IMPRESSION: Pt presents today with reports of 1/10 neck pain.  Skilled PT session focused on review and progression of neck flexibility and postural strengthening exercises. She particularly likes the stretch of cervical retraction, with towel behind head.  STM performed to R UT, which significant tightness noted, which does lessen with the STM.  Also began some work on balance with narrowed BOS and SLS, needing light UE support for optimal stability.    She will continue to benefit from  skilled PT towards goals for improved functional mobility, decreased pain, and decreased fall risk.   OBJECTIVE IMPAIRMENTS: Abnormal gait, decreased activity tolerance, decreased balance, difficulty walking, decreased ROM, decreased strength, increased muscle spasms, impaired flexibility, postural dysfunction, and pain.   ACTIVITY LIMITATIONS: carrying, lifting, bending, standing, squatting, sleeping, stairs, transfers, bathing, reach over head, hygiene/grooming, and locomotion level  PARTICIPATION LIMITATIONS: meal prep, cleaning, laundry, driving, shopping, community activity, and riding horse  PERSONAL FACTORS: Age, Past/current experiences, Time since onset of injury/illness/exacerbation, and 3+ comorbidities: Anxiety, depression, HLD, RBBB, R mastectomy are also affecting patient's functional outcome.   REHAB POTENTIAL: Good  CLINICAL DECISION MAKING: Evolving/moderate complexity  EVALUATION COMPLEXITY: Moderate   GOALS: Goals reviewed with patient? Yes  SHORT TERM GOALS: Target date: 08/25/2024  Patient to be independent with initial HEP. Baseline: HEP initiated Goal status: INITIAL    LONG TERM GOALS: Target date: 09/15/2024  Patient to be independent with advanced HEP. Baseline: Not yet initiated  Goal status: INITIAL  Patient to demonstrate B LE strength >/=4+/5.  Baseline: See above Goal  status: INITIAL  Patient to demonstrate cervical AROM improved by 10 degrees in all planes.  Baseline: see above Goal status: INITIAL  Patient to report resolution of neck pain. Baseline: has pain Goal status: INITIAL  Patient to score at least 23/30 on FGA in order to decrease risk of falls.  Baseline: 21/30 Goal status: INITIAL  Patient to complete 5xSTS in <15 sec with LRAD in order to decrease risk of falls.   Baseline: 21 sec Goal status: INITIAL    PLAN:  PT FREQUENCY: 1-2x/week  PT DURATION: 6 weeks  PLANNED INTERVENTIONS: 97164- PT Re-evaluation, 97110-Therapeutic exercises, 97530- Therapeutic activity, [id number]- Neuromuscular re-education, 97535- Self Care, 02859- Manual therapy, [id number]- Gait training, [phone]- Canalith repositioning, [phone] (1-2 muscles), 20561 (3+ muscles)- Dry Needling, Patient/Family education, Balance training, Stair training, Taping, Joint mobilization, Spinal mobilization, Vestibular training, Cryotherapy, and Moist heat  PLAN FOR NEXT SESSION: Review HEP updates and add cervical retraction sitting (forgot to do at end of this session!), may benefit from manual therapy additional and progressing self-mobilization strategies.  Address balance    Greig Anon, PT 08/13/24 10:58 AM Phone: [phone] Fax: [phone]  [facility] at [facility] [address] [place], [address] [place], [place] [address] Phone # [phone] Fax # [phone]

## 2024-08-16 NOTE — Therapy (Signed)
 OUTPATIENT PHYSICAL THERAPY CERVICAL TREATMENT   Patient Name: Kelly George MRN: [id number] DOB:[birth year], 78 y.o., female Today's Date: 08/17/2024  END OF SESSION:  PT End of Session - 08/17/24 1140     Visit Number 4    Number of Visits 13    Date for Recertification  09/15/24    Authorization Type Medicare/Generic Supplemental    PT Start Time 1101    PT Stop Time 1142    PT Time Calculation (min) 41 min    Equipment Utilized During Treatment Gait belt    Activity Tolerance Patient tolerated treatment well    Behavior During Therapy WFL for tasks assessed/performed            Past Medical History:  Diagnosis Date   Anxiety    Depression    Heart murmur    Hyperlipidemia    RBBB    Seasonal allergies    Past Surgical History:  Procedure Laterality Date   BREAST BIOPSY Right 10/2020   x2   BREAST EXCISIONAL BIOPSY Left    COLONOSCOPY  07/07/2001   MASTECTOMY Right 11/2020   MASTECTOMY W/ SENTINEL NODE BIOPSY Right 11/28/2020   Procedure: RIGHT MASTECTOMY WITH RIGHT AXILLARY SENTINEL LYMPH NODE BIOPSY;  Surgeon: Ebbie Cough, MD;  Location: [facility];  Service: General;  Laterality: Right;  RNFA, PEC BLOCK   SKIN SURGERY     DERM - nasal    TOOTH EXTRACTION     TUBAL LIGATION     Patient Active Problem List   Diagnosis Date Noted   Neck pain 07/05/2024   Hematuria 07/05/2024   Closed compression fracture of L5 vertebra (HCC) 10/17/2023   Breast cancer, right (HCC) 11/28/2020   Malignant neoplasm of upper-outer quadrant of right breast in female, estrogen receptor positive (HCC) 11/10/2020   Osteoarthritis of multiple joints 04/03/2020   Aortic atherosclerosis 12/03/2018   Dyslipidemia 12/03/2018   Precordial chest pain 07/07/2015   Pain in joint of right foot 05/16/2014   Vitamin D  deficiency 07/08/2013   Mitral valve prolapse syndrome, history of mild 01/05/2013   Seasonal allergies    Anxiety    Depression, recurrent     Hyperlipidemia     PCP: Micheal Wolm ORN, MD   REFERRING PROVIDER: Skeet Juliene SAUNDERS, DO  REFERRING DIAG: [phone] (ICD-10-CM) - Occipital neuralgia of right side  THERAPY DIAG:  Cervicalgia  History of falling  Other abnormalities of gait and mobility  Rationale for Evaluation and Treatment: Rehabilitation  ONSET DATE: 1 month  SUBJECTIVE:  SUBJECTIVE STATEMENT: Feeling better. Neck is loosening up and is tolerating exercises fine.    Hand dominance: Right  PERTINENT HISTORY:  Anxiety, depression, HLD, RBBB, R mastectomy  PAIN:  Are you having pain? Yes: NPRS scale: 0/10 Pain location: neck Pain description: discomfort Aggravating factors: turning head Relieving factors: relax, head straight, ibuprofen  PRECAUTIONS: Fall  RED FLAGS: None     WEIGHT BEARING RESTRICTIONS: No  FALLS:  Has patient fallen in last 6 months? Yes. Number of falls 3.   LIVING ENVIRONMENT: Lives with: lives alone Lives in: House/apartment Stairs: none Has following equipment at home: Grab bars  OCCUPATION: retired  PLOF: Independent  PATIENT GOALS: improve pain and be able to turn my neck  NEXT MD VISIT: 01/20/25  OBJECTIVE:    TODAY'S TREATMENT: 08/17/24 Activity Comments  Cervical retraction sitting 10x3 Cueing to keep jaw shut to avoid pushing on mandible. Limited ROM but good tolerance   STS without UEs 10x Cueing for slow eccentric control. Last 5 sitting on airex pad d/t knee pain. Cueing to increase fwd trunk lean and tucking feet under   backwards walking 1 UE support on TM rail; cues for longer steps   walking EC 1 UE support on TM rail; unsteadiness improved with practice   gait + head turns/nods Veering to the L and mod instability; c/o dizziness (pt notes this is  an chronic issue). Required  close CGA  Sitting head turns to targets 2x30 Slow speed and c/o mild dizziness; report of increased symptoms at quicker pace. Pt compensates with trunk motion d/t cervical tightness      HOME EXERCISE PROGRAM Last updated: 08/17/24 Access Code: [email or web address] URL: [email or web address] Date: 08/17/2024 Prepared by: [facility] - Outpatient  Rehab - [facility]  Exercises - Seated Assisted Cervical Rotation with Towel  - 1 x daily - 5 x weekly - 2 sets - 10 reps - Mid-Lower Cervical Extension SNAG with Strap  - 1 x daily - 5 x weekly - 2 sets - 10 reps - Seated Bilateral Shoulder External Rotation with Resistance  - 1 x daily - 5 x weekly - 2 sets - 10 reps - Seated Shoulder Horizontal Abduction with Resistance  - 1 x daily - 5 x weekly - 2 sets - 10 reps - Standing Forward Step Taps with Counter Support  - 1 x daily - 5 x weekly - 2 sets - 10 reps - Narrow Stance with Counter Support  - 1 x daily - 5 x weekly - 1 sets - 3 reps - 15-30 sec hold - Side Stepping with Counter Support  - 1 x daily - 5 x weekly - 1 sets - 3 reps - Cervical Retraction with Overpressure  - 1 x daily - 5 x weekly - 2 sets - 10 reps - 3 sec hold   PATIENT EDUCATION: Education details: HEP update, discussed pt's chronic hx of dizziness, car sickness, and optokinetic sensitivity and possible causes  Person educated: Patient Education method: Explanation, Demonstration, Tactile cues, Verbal cues, and Handouts Education comprehension: verbalized understanding and returned demonstration     Note: Objective measures were completed at Evaluation unless otherwise noted.  DIAGNOSTIC FINDINGS:  07/07/24 head neck CT angio: No evidence of acute intracranial abnormality. 2. Moderate chronic small vessel ischemic disease. 3. Mild atherosclerosis in the head and neck without a large vessel occlusion. 4. Severe left P2 stenosis.   PATIENT SURVEYS:  NDI:13/50,  26%  COGNITION: Overall cognitive status: Within functional limits for tasks  assessed  SENSATION: Pt reports occasional N/T in fingertips while driving  POSTURE: shoulders elevated and neck slightly flexed with reduced lordosis   PALPATION: TTP over B scalenes and L UT; soft tissue restriction throughout cervical musculature    CERVICAL ROM:   Active ROM AROM (deg) eval  Flexion 39  Extension 25 *c/o pain  Right lateral flexion 15  Left lateral flexion 14  Right rotation 27 *c/o tight  Left rotation 22   (Blank rows = not tested)   UPPER EXTREMITY ROM:  Active ROM Right eval Left eval  Shoulder flexion [facility]  Shoulder extension    Shoulder abduction    Shoulder adduction    Shoulder extension    Shoulder internal rotation ~ T10 ~ T10  Shoulder external rotation Back of head Back of head  Elbow flexion    Elbow extension    Wrist flexion    Wrist extension    Wrist ulnar deviation    Wrist radial deviation    Wrist pronation    Wrist supination     (Blank rows = not tested)  UPPER EXTREMITY MMT:  MMT Right eval Left eval  Shoulder flexion 4+ 4  Shoulder extension    Shoulder abduction 4- 4-  Shoulder adduction    Shoulder extension    Shoulder internal rotation 4- 4-  Shoulder external rotation 4- 4-  Middle trapezius    Lower trapezius    Elbow flexion    Elbow extension    Wrist flexion    Wrist extension    Wrist ulnar deviation    Wrist radial deviation    Wrist pronation    Wrist supination    Grip strength     (Blank rows = not tested)   Gait: Slightly unsteady and antalgic on R LE  TREATMENT DATE: 08/04/24                                                                                                                                 PATIENT EDUCATION:  Education details: prognosis, POC, HEP, edu on benefits of balance training to avoid falls and injury Person educated: Patient Education method: Programmer, Multimedia, Demonstration,  Tactile cues, Verbal cues, and Handouts Education comprehension: verbalized understanding and returned demonstration  HOME EXERCISE PROGRAM: Access Code: [email or web address] URL: [email or web address] Date: 08/04/2024 Prepared by: [facility] - Outpatient  Rehab - [facility]  Exercises - Seated Assisted Cervical Rotation with Towel  - 1 x daily - 5 x weekly - 2 sets - 10 reps - Mid-Lower Cervical Extension SNAG with Strap  - 1 x daily - 5 x weekly - 2 sets - 10 reps - Seated Bilateral Shoulder External Rotation with Resistance  - 1 x daily - 5 x weekly - 2 sets - 10 reps - Seated Shoulder Horizontal Abduction with Resistance  - 1 x daily - 5 x weekly - 2 sets - 10 reps   ASSESSMENT:  CLINICAL  IMPRESSION: Patient arrived to session with report of improved neck pain. Session focused on transfer training with cues for proper set up and control as well and standing balance challenges including multisensory challenges, and head movements. Pt with c/o dizziness and imbalance with head movements, which she reveals is a chronic issue. Initiated low intensity gaze stabilization activities and plan for further vestibular assessment next session to see how vestibular impairments are affecting balance and falls. Patient tolerated session well and without complaints at end of appointment.  OBJECTIVE IMPAIRMENTS: Abnormal gait, decreased activity tolerance, decreased balance, difficulty walking, decreased ROM, decreased strength, increased muscle spasms, impaired flexibility, postural dysfunction, and pain.   ACTIVITY LIMITATIONS: carrying, lifting, bending, standing, squatting, sleeping, stairs, transfers, bathing, reach over head, hygiene/grooming, and locomotion level  PARTICIPATION LIMITATIONS: meal prep, cleaning, laundry, driving, shopping, community activity, and riding horse  PERSONAL FACTORS: Age, Past/current experiences, Time since onset of injury/illness/exacerbation, and 3+  comorbidities: Anxiety, depression, HLD, RBBB, R mastectomy are also affecting patient's functional outcome.   REHAB POTENTIAL: Good  CLINICAL DECISION MAKING: Evolving/moderate complexity  EVALUATION COMPLEXITY: Moderate   GOALS: Goals reviewed with patient? Yes  SHORT TERM GOALS: Target date: 08/25/2024  Patient to be independent with initial HEP. Baseline: HEP initiated Goal status: MET     LONG TERM GOALS: Target date: 09/15/2024  Patient to be independent with advanced HEP. Baseline: Not yet initiated  Goal status: INITIAL  Patient to demonstrate B LE strength >/=4+/5.  Baseline: See above Goal status: INITIAL  Patient to demonstrate cervical AROM improved by 10 degrees in all planes.  Baseline: see above Goal status: INITIAL  Patient to report resolution of neck pain. Baseline: has pain Goal status: INITIAL  Patient to score at least 23/30 on FGA in order to decrease risk of falls.  Baseline: 21/30 Goal status: INITIAL  Patient to complete 5xSTS in <15 sec with LRAD in order to decrease risk of falls.   Baseline: 21 sec Goal status: INITIAL    PLAN:  PT FREQUENCY: 1-2x/week  PT DURATION: 6 weeks  PLANNED INTERVENTIONS: 97164- PT Re-evaluation, 97110-Therapeutic exercises, 97530- Therapeutic activity, [id number]- Neuromuscular re-education, 97535- Self Care, [id number]- Manual therapy, [id number]- Gait training, [phone]- Canalith repositioning, [phone] (1-2 muscles), [id number] (3+ muscles)- Dry Needling, Patient/Family education, Balance training, Stair training, Taping, Joint mobilization, Spinal mobilization, Vestibular training, Cryotherapy, and Moist heat  PLAN FOR NEXT SESSION: vestibular assessment and address impairments as they are contributing to imbalance and falls; Review HEP updates and add cervical retraction sitting (forgot to do at end of this session!), may benefit from manual therapy additional and progressing self-mobilization strategies.  Address  balance   Louana Terrilyn Christians, [place], DPT 08/17/24 11:54 AM  [facility] [address] [place], [address] [place], [place] [address] Phone # [phone] Fax # [phone]

## 2024-08-17 ENCOUNTER — Encounter: Payer: Self-pay | Admitting: Physical Therapy

## 2024-08-17 ENCOUNTER — Ambulatory Visit: Payer: PRIVATE HEALTH INSURANCE | Attending: Neurology | Admitting: Physical Therapy

## 2024-08-17 DIAGNOSIS — R2689 Other abnormalities of gait and mobility: Secondary | ICD-10-CM | POA: Diagnosis not present

## 2024-08-17 DIAGNOSIS — M542 Cervicalgia: Secondary | ICD-10-CM | POA: Insufficient documentation

## 2024-08-17 DIAGNOSIS — Z9181 History of falling: Secondary | ICD-10-CM | POA: Insufficient documentation

## 2024-08-18 NOTE — Therapy (Signed)
 OUTPATIENT PHYSICAL THERAPY CERVICAL TREATMENT   Patient Name: Kelly George MRN: [id number] DOB:[birth year], 78 y.o., female Today's Date: 08/19/2024  END OF SESSION:  PT End of Session - 08/19/24 1233     Visit Number 5    Number of Visits 13    Date for Recertification  09/15/24    Authorization Type Medicare/Generic Supplemental    PT Start Time 1150    PT Stop Time 1231    PT Time Calculation (min) 41 min    Activity Tolerance Patient tolerated treatment well    Behavior During Therapy [facility] for tasks assessed/performed             Past Medical History:  Diagnosis Date   Anxiety    Depression    Heart murmur    Hyperlipidemia    RBBB    Seasonal allergies    Past Surgical History:  Procedure Laterality Date   BREAST BIOPSY Right 10/2020   x2   BREAST EXCISIONAL BIOPSY Left    COLONOSCOPY  07/07/2001   MASTECTOMY Right 11/2020   MASTECTOMY W/ SENTINEL NODE BIOPSY Right 11/28/2020   Procedure: RIGHT MASTECTOMY WITH RIGHT AXILLARY SENTINEL LYMPH NODE BIOPSY;  Surgeon: Ebbie Cough, MD;  Location: [facility];  Service: General;  Laterality: Right;  RNFA, PEC BLOCK   SKIN SURGERY     DERM - nasal    TOOTH EXTRACTION     TUBAL LIGATION     Patient Active Problem List   Diagnosis Date Noted   Neck pain 07/05/2024   Hematuria 07/05/2024   Closed compression fracture of L5 vertebra (HCC) 10/17/2023   Breast cancer, right (HCC) 11/28/2020   Malignant neoplasm of upper-outer quadrant of right breast in female, estrogen receptor positive (HCC) 11/10/2020   Osteoarthritis of multiple joints 04/03/2020   Aortic atherosclerosis 12/03/2018   Dyslipidemia 12/03/2018   Precordial chest pain 07/07/2015   Pain in joint of right foot 05/16/2014   Vitamin D  deficiency 07/08/2013   Mitral valve prolapse syndrome, history of mild 01/05/2013   Seasonal allergies    Anxiety    Depression, recurrent    Hyperlipidemia     PCP: Micheal Wolm ORN,  MD   REFERRING PROVIDER: Skeet Juliene SAUNDERS, DO  REFERRING DIAG: [phone] (ICD-10-CM) - Occipital neuralgia of right side  THERAPY DIAG:  Cervicalgia  History of falling  Other abnormalities of gait and mobility  Rationale for Evaluation and Treatment: Rehabilitation  ONSET DATE: 1 month  SUBJECTIVE:  SUBJECTIVE STATEMENT: Took my dog for a walk and did some exercises.    Hand dominance: Right  PERTINENT HISTORY:  Anxiety, depression, HLD, RBBB, R mastectomy  PAIN:  Are you having pain? Yes: NPRS scale: 0/10 Pain location: neck Pain description: discomfort Aggravating factors: turning head Relieving factors: relax, head straight, ibuprofen  PRECAUTIONS: Fall  RED FLAGS: None     WEIGHT BEARING RESTRICTIONS: No  FALLS:  Has patient fallen in last 6 months? Yes. Number of falls 3.   LIVING ENVIRONMENT: Lives with: lives alone Lives in: House/apartment Stairs: none Has following equipment at home: Grab bars  OCCUPATION: retired  PLOF: Independent  PATIENT GOALS: improve pain and be able to turn my neck  NEXT MD VISIT: 01/20/25  OBJECTIVE:   VESTIBULAR ASSESSMENT   GENERAL OBSERVATION: pt wears progressives    OCULOMOTOR EXAM: Ocular alignment: L exotropia   Ocular ROM: WNL   Spontaneous Nystagmus: WNL   Gaze-Induced Nystagmus: WNL   Smooth Pursuits: WNL   Saccades: WNL   Convergence/Divergence: ~13 cm    VESTIBULAR - OCULAR REFLEX:    Slow VOR: Normal; limited ROM and c/o wooziness horizontal    VOR Cancellation: Normal; c/o mild wooziness    Head-Impulse Test: HIT Right: negative HIT Left: positive (this fatigued) *c/o mild dizziness  AUDITORY SCREEN:  Rinne Test WNL    POSITIONAL TESTING:  Right Roll Test: negative Left Roll Test:  negative C/o wooziness upon sitting up  Right Loaded Dix-Hallpike modified over pillow: negative; c/o less wooziness sitting up Left Loaded Dix-Hallpike modified over pillow: negative; c/o wooziness and nausea upon sitting up    TODAY'S TREATMENT: 08/19/24 Activity Comments  Sitting horizontal VOR 30 seconds 5/10 dizziness   Sitting horizontal VOR cancellation 30 sec 6/10 dizziness                 PATIENT EDUCATION: Education details: edu on exam findings, basis of habituation and vestibular rehab, edu on reducing visual reliance with balance activities, edu on intended level of symptoms and modifications Person educated: Patient Education method: Explanation, Demonstration, Tactile cues, Verbal cues, and Handouts Education comprehension: verbalized understanding and returned demonstration   HOME EXERCISE PROGRAM Access Code: [email or web address] URL: [email or web address] Date: 08/19/2024 Prepared by: [facility] - Outpatient  Rehab - [facility]  Exercises - Seated Assisted Cervical Rotation with Towel  - 1 x daily - 5 x weekly - 2 sets - 10 reps - Mid-Lower Cervical Extension SNAG with Strap  - 1 x daily - 5 x weekly - 2 sets - 10 reps - Seated Bilateral Shoulder External Rotation with Resistance  - 1 x daily - 5 x weekly - 2 sets - 10 reps - Seated Shoulder Horizontal Abduction with Resistance  - 1 x daily - 5 x weekly - 2 sets - 10 reps - Standing Forward Step Taps with Counter Support  - 1 x daily - 5 x weekly - 2 sets - 10 reps - Narrow Stance with Counter Support  - 1 x daily - 5 x weekly - 1 sets - 3 reps - 15-30 sec hold - Side Stepping with Counter Support  - 1 x daily - 5 x weekly - 1 sets - 3 reps - Cervical Retraction with Overpressure  - 1 x daily - 5 x weekly - 2 sets - 10 reps - 3 sec hold - Seated Left Head Turns Vestibular Habituation  - 1 x daily - 5 x weekly - 2 sets - 30 sec hold -  Seated VOR Cancellation  - 1 x daily - 5 x weekly - 2 sets - 30 sec  hold       Note: Objective measures were completed at Evaluation unless otherwise noted.  DIAGNOSTIC FINDINGS:  07/07/24 head neck CT angio: No evidence of acute intracranial abnormality. 2. Moderate chronic small vessel ischemic disease. 3. Mild atherosclerosis in the head and neck without a large vessel occlusion. 4. Severe left P2 stenosis.   PATIENT SURVEYS:  NDI:13/50, 26%  COGNITION: Overall cognitive status: Within functional limits for tasks assessed  SENSATION: Pt reports occasional N/T in fingertips while driving  POSTURE: shoulders elevated and neck slightly flexed with reduced lordosis   PALPATION: TTP over B scalenes and L UT; soft tissue restriction throughout cervical musculature    CERVICAL ROM:   Active ROM AROM (deg) eval  Flexion 39  Extension 25 *c/o pain  Right lateral flexion 15  Left lateral flexion 14  Right rotation 27 *c/o tight  Left rotation 22   (Blank rows = not tested)   UPPER EXTREMITY ROM:  Active ROM Right eval Left eval  Shoulder flexion [facility]  Shoulder extension    Shoulder abduction    Shoulder adduction    Shoulder extension    Shoulder internal rotation ~ T10 ~ T10  Shoulder external rotation Back of head Back of head  Elbow flexion    Elbow extension    Wrist flexion    Wrist extension    Wrist ulnar deviation    Wrist radial deviation    Wrist pronation    Wrist supination     (Blank rows = not tested)  UPPER EXTREMITY MMT:  MMT Right eval Left eval  Shoulder flexion 4+ 4  Shoulder extension    Shoulder abduction 4- 4-  Shoulder adduction    Shoulder extension    Shoulder internal rotation 4- 4-  Shoulder external rotation 4- 4-  Middle trapezius    Lower trapezius    Elbow flexion    Elbow extension    Wrist flexion    Wrist extension    Wrist ulnar deviation    Wrist radial deviation    Wrist pronation    Wrist supination    Grip strength     (Blank rows = not tested)   Gait:  Slightly unsteady and antalgic on R LE  TREATMENT DATE: 08/04/24                                                                                                                                 PATIENT EDUCATION:  Education details: prognosis, POC, HEP, edu on benefits of balance training to avoid falls and injury Person educated: Patient Education method: Programmer, Multimedia, Demonstration, Tactile cues, Verbal cues, and Handouts Education comprehension: verbalized understanding and returned demonstration  HOME EXERCISE PROGRAM: Access Code: [email or web address] URL: [email or web address] Date: 08/04/2024 Prepared by: [facility] - Outpatient  Rehab - [facility]  Exercises -  Seated Assisted Cervical Rotation with Towel  - 1 x daily - 5 x weekly - 2 sets - 10 reps - Mid-Lower Cervical Extension SNAG with Strap  - 1 x daily - 5 x weekly - 2 sets - 10 reps - Seated Bilateral Shoulder External Rotation with Resistance  - 1 x daily - 5 x weekly - 2 sets - 10 reps - Seated Shoulder Horizontal Abduction with Resistance  - 1 x daily - 5 x weekly - 2 sets - 10 reps   ASSESSMENT:  CLINICAL IMPRESSION: Patient arrived to session with out complaints. Session focused on vestibular assessment. 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 Initiated HEP for gaze stabilization and visual motion habituation and educated on modification of exercises if necessary. Patient tolerated session well and noted some mild nausea at end of session.   OBJECTIVE IMPAIRMENTS: Abnormal gait, decreased activity tolerance, decreased balance, difficulty walking, decreased ROM, decreased strength, increased muscle spasms, impaired flexibility, postural dysfunction, and pain.   ACTIVITY LIMITATIONS: carrying, lifting, bending, standing, squatting, sleeping, stairs, transfers, bathing, reach over  head, hygiene/grooming, and locomotion level  PARTICIPATION LIMITATIONS: meal prep, cleaning, laundry, driving, shopping, community activity, and riding horse  PERSONAL FACTORS: Age, Past/current experiences, Time since onset of injury/illness/exacerbation, and 3+ comorbidities: Anxiety, depression, HLD, RBBB, R mastectomy are also affecting patient's functional outcome.   REHAB POTENTIAL: Good  CLINICAL DECISION MAKING: Evolving/moderate complexity  EVALUATION COMPLEXITY: Moderate   GOALS: Goals reviewed with patient? Yes  SHORT TERM GOALS: Target date: 08/25/2024  Patient to be independent with initial HEP. Baseline: HEP initiated Goal status: MET     LONG TERM GOALS: Target date: 09/15/2024  Patient to be independent with advanced HEP. Baseline: Not yet initiated  Goal status: INITIAL  Patient to demonstrate B LE strength >/=4+/5.  Baseline: See above Goal status: INITIAL  Patient to demonstrate cervical AROM improved by 10 degrees in all planes.  Baseline: see above Goal status: INITIAL  Patient to report resolution of neck pain. Baseline: has pain Goal status: INITIAL  Patient to score at least 23/30 on FGA in order to decrease risk of falls.  Baseline: 21/30 Goal status: INITIAL  Patient to complete 5xSTS in <15 sec with LRAD in order to decrease risk of falls.   Baseline: 21 sec Goal status: INITIAL    PLAN:  PT FREQUENCY: 1-2x/week  PT DURATION: 6 weeks  PLANNED INTERVENTIONS: 97164- PT Re-evaluation, 97110-Therapeutic exercises, 97530- Therapeutic activity, [id number]- Neuromuscular re-education, 97535- Self Care, 02859- Manual therapy, [id number]- Gait training, [id number]- Canalith repositioning, [phone] (1-2 muscles), [id number] (3+ muscles)- Dry Needling, Patient/Family education, Balance training, Stair training, Taping, Joint mobilization, Spinal mobilization, Vestibular training, Cryotherapy, and Moist heat  PLAN FOR NEXT SESSION: gaze stability, address visual  dependence with balance; Review HEP updates and add cervical retraction sitting (forgot to do at end of this session!), may benefit from manual therapy additional and progressing self-mobilization strategies.  Address balance   Louana Terrilyn Christians, [place], DPT 08/19/24 12:34 PM  [facility] Re[facility] at [facility] [address] [place], [address] [place], [place] [address] Phone # [phone] Fax # [phone]

## 2024-08-19 ENCOUNTER — Ambulatory Visit: Payer: PRIVATE HEALTH INSURANCE | Admitting: Physical Therapy

## 2024-08-19 ENCOUNTER — Encounter: Payer: Self-pay | Admitting: Physical Therapy

## 2024-08-19 DIAGNOSIS — M542 Cervicalgia: Secondary | ICD-10-CM

## 2024-08-19 DIAGNOSIS — R2689 Other abnormalities of gait and mobility: Secondary | ICD-10-CM

## 2024-08-19 DIAGNOSIS — Z9181 History of falling: Secondary | ICD-10-CM

## 2024-08-20 DIAGNOSIS — N1330 Unspecified hydronephrosis: Secondary | ICD-10-CM | POA: Diagnosis not present

## 2024-08-23 ENCOUNTER — Encounter: Payer: Self-pay | Admitting: Physical Therapy

## 2024-08-23 ENCOUNTER — Ambulatory Visit: Payer: PRIVATE HEALTH INSURANCE | Admitting: Physical Therapy

## 2024-08-23 DIAGNOSIS — Z9181 History of falling: Secondary | ICD-10-CM | POA: Diagnosis not present

## 2024-08-23 DIAGNOSIS — M542 Cervicalgia: Secondary | ICD-10-CM

## 2024-08-23 DIAGNOSIS — R2689 Other abnormalities of gait and mobility: Secondary | ICD-10-CM

## 2024-08-23 NOTE — Therapy (Signed)
 OUTPATIENT PHYSICAL THERAPY CERVICAL TREATMENT   Patient Name: Kelly George MRN: [id number] DOB:1946-04-01, 78 y.o., female Today's Date: 08/23/2024  END OF SESSION:  PT End of Session - 08/23/24 0927     Visit Number 6    Number of Visits 13    Date for Recertification  09/15/24    Authorization Type Medicare/Generic Supplemental    PT Start Time 0930    PT Stop Time 1010    PT Time Calculation (min) 40 min    Equipment Utilized During Treatment Gait belt    Activity Tolerance Patient tolerated treatment well    Behavior During Therapy WFL for tasks assessed/performed              Past Medical History:  Diagnosis Date   Anxiety    Depression    Heart murmur    Hyperlipidemia    RBBB    Seasonal allergies    Past Surgical History:  Procedure Laterality Date   BREAST BIOPSY Right 10/2020   x2   BREAST EXCISIONAL BIOPSY Left    COLONOSCOPY  07/07/2001   MASTECTOMY Right 11/2020   MASTECTOMY W/ SENTINEL NODE BIOPSY Right 11/28/2020   Procedure: RIGHT MASTECTOMY WITH RIGHT AXILLARY SENTINEL LYMPH NODE BIOPSY;  Surgeon: Ebbie Cough, MD;  Location: [facility];  Service: General;  Laterality: Right;  RNFA, PEC BLOCK   SKIN SURGERY     DERM - nasal    TOOTH EXTRACTION     TUBAL LIGATION     Patient Active Problem List   Diagnosis Date Noted   Neck pain 07/05/2024   Hematuria 07/05/2024   Closed compression fracture of L5 vertebra (HCC) 10/17/2023   Breast cancer, right (HCC) 11/28/2020   Malignant neoplasm of upper-outer quadrant of right breast in female, estrogen receptor positive (HCC) 11/10/2020   Osteoarthritis of multiple joints 04/03/2020   Aortic atherosclerosis 12/03/2018   Dyslipidemia 12/03/2018   Precordial chest pain 07/07/2015   Pain in joint of right foot 05/16/2014   Vitamin D  deficiency 07/08/2013   Mitral valve prolapse syndrome, history of mild 01/05/2013   Seasonal allergies    Anxiety    Depression,  recurrent    Hyperlipidemia     PCP: Micheal Wolm ORN, MD   REFERRING PROVIDER: Skeet Juliene SAUNDERS, DO  REFERRING DIAG: [phone] (ICD-10-CM) - Occipital neuralgia of right side  THERAPY DIAG:  Cervicalgia  History of falling  Other abnormalities of gait and mobility  Rationale for Evaluation and Treatment: Rehabilitation  ONSET DATE: 1 month  SUBJECTIVE:  SUBJECTIVE STATEMENT: I think I'm improving.  Been religious with exercises.   Hand dominance: Right  PERTINENT HISTORY:  Anxiety, depression, HLD, RBBB, R mastectomy  PAIN:  Are you having pain? Yes: NPRS scale: 0/10 Pain location: neck Pain description: discomfort, stiffness Aggravating factors: turning head Relieving factors: relax, head straight, ibuprofen  PRECAUTIONS: Fall  RED FLAGS: None     WEIGHT BEARING RESTRICTIONS: No  FALLS:  Has patient fallen in last 6 months? Yes. Number of falls 3.   LIVING ENVIRONMENT: Lives with: lives alone Lives in: House/apartment Stairs: none Has following equipment at home: Grab bars  OCCUPATION: retired  PLOF: Independent  PATIENT GOALS: improve pain and be able to turn my neck  NEXT MD VISIT: 01/20/25  OBJECTIVE:    TODAY'S TREATMENT: 08/23/2024 Activity Comments  Review of HEP given last visit: Neck retraction L and R head turns Seated VOR cancellation   2/10 3-4/10  Standing L and R head turns to targets x 30 sec Head nods x 30 sec 2-3/10  3/10  Standing VOR gaze stabilization x 1, 30 sec horizontal  Hard to coordinate 3+/10, queasiness  Counter balance exercises: Feet apart>feet together EC>partial heel/toe 30 sec each, increased sway partial heel/toe  Walking along counter with EC  UE support, mild unsteadiness  Forward/back walking at  [employer] picking up dog toy with squat x 3 Reports mild queasiness and unsteadiness  Seated bend down to target 5 reps Reports 4/10 symptoms of dizziness, queasiness -uses alcohol wipe to settle (after 3 minutes, still 4/10)  Gait 50 ft x 4 reps Decreased overall symptoms after gait     VESTIBULAR ASSESSMENT   GENERAL OBSERVATION: pt wears progressives    OCULOMOTOR EXAM: Ocular alignment: L exotropia   Ocular ROM: WNL   Spontaneous Nystagmus: WNL   Gaze-Induced Nystagmus: WNL   Smooth Pursuits: WNL   Saccades: WNL   Convergence/Divergence: ~13 cm    VESTIBULAR - OCULAR REFLEX:    Slow VOR: Normal; limited ROM and c/o wooziness horizontal    VOR Cancellation: Normal; c/o mild wooziness    Head-Impulse Test: HIT Right: negative HIT Left: positive (this fatigued) *c/o mild dizziness  AUDITORY SCREEN:  Rinne Test WNL    POSITIONAL TESTING:  Right Roll Test: negative Left Roll Test: negative C/o wooziness upon sitting up  Right Loaded Dix-Hallpike modified over pillow: negative; c/o less wooziness sitting up Left Loaded Dix-Hallpike modified over pillow: negative; c/o wooziness and nausea upon sitting up    TODAY'S TREATMENT: 08/19/24 Activity Comments  Sitting horizontal VOR 30 seconds 5/10 dizziness   Sitting horizontal VOR cancellation 30 sec 6/10 dizziness                 PATIENT EDUCATION: Education details: 08/23/2024:  Continue current HEP and use of visual targets for stability/decrease in symptoms when reaching down to pick up objects from floor Person educated: Patient Education method: Explanation, Demonstration, Tactile cues, Verbal cues, and Handouts Education comprehension: verbalized understanding and returned demonstration   HOME EXERCISE PROGRAM Access Code: [email or web address] URL: [email or web address] Date: 08/19/2024 Prepared by: [facility] - Outpatient  Rehab - [facility]  Exercises - Seated Assisted Cervical Rotation  with Towel  - 1 x daily - 5 x weekly - 2 sets - 10 reps - Mid-Lower Cervical Extension SNAG with Strap  - 1 x daily - 5 x weekly - 2 sets - 10 reps - Seated Bilateral Shoulder External Rotation with Resistance  - 1 x daily -  5 x weekly - 2 sets - 10 reps - Seated Shoulder Horizontal Abduction with Resistance  - 1 x daily - 5 x weekly - 2 sets - 10 reps - Standing Forward Step Taps with Counter Support  - 1 x daily - 5 x weekly - 2 sets - 10 reps - Narrow Stance with Counter Support  - 1 x daily - 5 x weekly - 1 sets - 3 reps - 15-30 sec hold - Side Stepping with Counter Support  - 1 x daily - 5 x weekly - 1 sets - 3 reps - Cervical Retraction with Overpressure  - 1 x daily - 5 x weekly - 2 sets - 10 reps - 3 sec hold - Seated Left Head Turns Vestibular Habituation  - 1 x daily - 5 x weekly - 2 sets - 30 sec hold - Seated VOR Cancellation  - 1 x daily - 5 x weekly - 2 sets - 30 sec hold       Note: Objective measures were completed at Evaluation unless otherwise noted.  DIAGNOSTIC FINDINGS:  07/07/24 head neck CT angio: No evidence of acute intracranial abnormality. 2. Moderate chronic small vessel ischemic disease. 3. Mild atherosclerosis in the head and neck without a large vessel occlusion. 4. Severe left P2 stenosis.   PATIENT SURVEYS:  NDI:13/50, 26%  COGNITION: Overall cognitive status: Within functional limits for tasks assessed  SENSATION: Pt reports occasional N/T in fingertips while driving  POSTURE: shoulders elevated and neck slightly flexed with reduced lordosis   PALPATION: TTP over B scalenes and L UT; soft tissue restriction throughout cervical musculature    CERVICAL ROM:   Active ROM AROM (deg) eval  Flexion 39  Extension 25 *c/o pain  Right lateral flexion 15  Left lateral flexion 14  Right rotation 27 *c/o tight  Left rotation 22   (Blank rows = not tested)   UPPER EXTREMITY ROM:  Active ROM Right eval Left eval  Shoulder flexion [facility]   Shoulder extension    Shoulder abduction    Shoulder adduction    Shoulder extension    Shoulder internal rotation ~ T10 ~ T10  Shoulder external rotation Back of head Back of head  Elbow flexion    Elbow extension    Wrist flexion    Wrist extension    Wrist ulnar deviation    Wrist radial deviation    Wrist pronation    Wrist supination     (Blank rows = not tested)  UPPER EXTREMITY MMT:  MMT Right eval Left eval  Shoulder flexion 4+ 4  Shoulder extension    Shoulder abduction 4- 4-  Shoulder adduction    Shoulder extension    Shoulder internal rotation 4- 4-  Shoulder external rotation 4- 4-  Middle trapezius    Lower trapezius    Elbow flexion    Elbow extension    Wrist flexion    Wrist extension    Wrist ulnar deviation    Wrist radial deviation    Wrist pronation    Wrist supination    Grip strength     (Blank rows = not tested)   Gait: Slightly unsteady and antalgic on R LE  TREATMENT DATE: 08/04/24  PATIENT EDUCATION:  Education details: prognosis, POC, HEP, edu on benefits of balance training to avoid falls and injury Person educated: Patient Education method: Explanation, Demonstration, Tactile cues, Verbal cues, and Handouts Education comprehension: verbalized understanding and returned demonstration  HOME EXERCISE PROGRAM: Access Code: [email or web address] URL: [email or web address] Date: 08/04/2024 Prepared by: [facility] - Outpatient  Rehab - [facility]  Exercises - Seated Assisted Cervical Rotation with Towel  - 1 x daily - 5 x weekly - 2 sets - 10 reps - Mid-Lower Cervical Extension SNAG with Strap  - 1 x daily - 5 x weekly - 2 sets - 10 reps - Seated Bilateral Shoulder External Rotation with Resistance  - 1 x daily - 5 x weekly - 2 sets - 10 reps - Seated Shoulder Horizontal Abduction with Resistance   - 1 x daily - 5 x weekly - 2 sets - 10 reps   ASSESSMENT:  CLINICAL IMPRESSION: Pt presents today with reports of improvement of dizziness symptoms with work on exercises over the weekend.. Skilled PT session focused on continued progression of vestibular exercises, VOR, habituation.  With attempts at standing progression of VOR and habituation with bending over to pick up objects (like dog toys), she has queasiness as part of her symptoms.  Recommend pt continue current HEP, as her seated exercises are bringing on less symptoms than last visit, and we will try to progress more next visit. Mild nausea at end of session and pt reports no difficulty being able to leave session independently.  OBJECTIVE IMPAIRMENTS: Abnormal gait, decreased activity tolerance, decreased balance, difficulty walking, decreased ROM, decreased strength, increased muscle spasms, impaired flexibility, postural dysfunction, and pain.   ACTIVITY LIMITATIONS: carrying, lifting, bending, standing, squatting, sleeping, stairs, transfers, bathing, reach over head, hygiene/grooming, and locomotion level  PARTICIPATION LIMITATIONS: meal prep, cleaning, laundry, driving, shopping, community activity, and riding horse  PERSONAL FACTORS: Age, Past/current experiences, Time since onset of injury/illness/exacerbation, and 3+ comorbidities: Anxiety, depression, HLD, RBBB, R mastectomy are also affecting patient's functional outcome.   REHAB POTENTIAL: Good  CLINICAL DECISION MAKING: Evolving/moderate complexity  EVALUATION COMPLEXITY: Moderate   GOALS: Goals reviewed with patient? Yes  SHORT TERM GOALS: Target date: 08/25/2024  Patient to be independent with initial HEP. Baseline: HEP initiated Goal status: MET     LONG TERM GOALS: Target date: 09/15/2024  Patient to be independent with advanced HEP. Baseline: Not yet initiated  Goal status: INITIAL  Patient to demonstrate B LE strength >/=4+/5.  Baseline: See  above Goal status: INITIAL  Patient to demonstrate cervical AROM improved by 10 degrees in all planes.  Baseline: see above Goal status: INITIAL  Patient to report resolution of neck pain. Baseline: has pain Goal status: INITIAL  Patient to score at least 23/30 on FGA in order to decrease risk of falls.  Baseline: 21/30 Goal status: INITIAL  Patient to complete 5xSTS in <15 sec with LRAD in order to decrease risk of falls.   Baseline: 21 sec Goal status: INITIAL    PLAN:  PT FREQUENCY: 1-2x/week  PT DURATION: 6 weeks  PLANNED INTERVENTIONS: 97164- PT Re-evaluation, 97110-Therapeutic exercises, 97530- Therapeutic activity, [id number]- Neuromuscular re-education, 97535- Self Care, [id number]- Manual therapy, [id number]- Gait training, [phone]- Canalith repositioning, [phone] (1-2 muscles), [id number] (3+ muscles)- Dry Needling, Patient/Family education, Balance training, Stair training, Taping, Joint mobilization, Spinal mobilization, Vestibular training, Cryotherapy, and Moist heat  PLAN FOR NEXT SESSION: habituation with picking up object from floor; gaze stability, address visual dependence with balance; , may benefit from manual  therapy additional and progressing self-mobilization strategies.  Address balance   Greig Anon, PT 08/23/24 10:11 AM Phone: [phone] Fax: [phone]  [facility] at [facility] [address] [place], [address] [place], [place] [address] Phone # [phone] Fax # [phone]

## 2024-08-25 ENCOUNTER — Ambulatory Visit: Payer: PRIVATE HEALTH INSURANCE | Admitting: Physical Therapy

## 2024-08-25 DIAGNOSIS — N3289 Other specified disorders of bladder: Secondary | ICD-10-CM | POA: Diagnosis not present

## 2024-08-25 DIAGNOSIS — N1330 Unspecified hydronephrosis: Secondary | ICD-10-CM | POA: Diagnosis not present

## 2024-08-30 NOTE — Therapy (Incomplete)
 OUTPATIENT PHYSICAL THERAPY CERVICAL TREATMENT   Patient Name: Kelly George MRN: [id number] DOB:1946-05-29, 78 y.o., female Today's Date: 08/30/2024  END OF SESSION:        Past Medical History:  Diagnosis Date   Anxiety    Depression    Heart murmur    Hyperlipidemia    RBBB    Seasonal allergies    Past Surgical History:  Procedure Laterality Date   BREAST BIOPSY Right 10/2020   x2   BREAST EXCISIONAL BIOPSY Left    COLONOSCOPY  07/07/2001   MASTECTOMY Right 11/2020   MASTECTOMY W/ SENTINEL NODE BIOPSY Right 11/28/2020   Procedure: RIGHT MASTECTOMY WITH RIGHT AXILLARY SENTINEL LYMPH NODE BIOPSY;  Surgeon: Ebbie Cough, MD;  Location: [facility];  Service: General;  Laterality: Right;  RNFA, PEC BLOCK   SKIN SURGERY     DERM - nasal    TOOTH EXTRACTION     TUBAL LIGATION     Patient Active Problem List   Diagnosis Date Noted   Neck pain 07/05/2024   Hematuria 07/05/2024   Closed compression fracture of L5 vertebra (HCC) 10/17/2023   Breast cancer, right (HCC) 11/28/2020   Malignant neoplasm of upper-outer quadrant of right breast in female, estrogen receptor positive (HCC) 11/10/2020   Osteoarthritis of multiple joints 04/03/2020   Aortic atherosclerosis 12/03/2018   Dyslipidemia 12/03/2018   Precordial chest pain 07/07/2015   Pain in joint of right foot 05/16/2014   Vitamin D  deficiency 07/08/2013   Mitral valve prolapse syndrome, history of mild 01/05/2013   Seasonal allergies    Anxiety    Depression, recurrent    Hyperlipidemia     PCP: Micheal Wolm ORN, MD   REFERRING PROVIDER: Skeet Juliene SAUNDERS, DO  REFERRING DIAG: [phone] (ICD-10-CM) - Occipital neuralgia of right side  THERAPY DIAG:  No diagnosis found.  Rationale for Evaluation and Treatment: Rehabilitation  ONSET DATE: 1 month  SUBJECTIVE:                                                                                                                                                                                                          SUBJECTIVE STATEMENT: I think I'm improving.  Been religious with exercises.   Hand dominance: Right  PERTINENT HISTORY:  Anxiety, depression, HLD, RBBB, R mastectomy  PAIN:  Are you having pain? Yes: NPRS scale: 0/10 Pain location: neck Pain description: discomfort, stiffness Aggravating factors: turning head Relieving factors: relax, head straight, ibuprofen  PRECAUTIONS: Fall  RED FLAGS: None     WEIGHT BEARING  RESTRICTIONS: No  FALLS:  Has patient fallen in last 6 months? Yes. Number of falls 3.   LIVING ENVIRONMENT: Lives with: lives alone Lives in: House/apartment Stairs: none Has following equipment at home: Grab bars  OCCUPATION: retired  PLOF: Independent  PATIENT GOALS: improve pain and be able to turn my neck  NEXT MD VISIT: 01/20/25  OBJECTIVE:     TODAY'S TREATMENT: 08/31/24 Activity Comments                        TODAY'S TREATMENT: 08/23/2024 Activity Comments  Review of HEP given last visit: Neck retraction L and R head turns Seated VOR cancellation   2/10 3-4/10  Standing L and R head turns to targets x 30 sec Head nods x 30 sec 2-3/10  3/10  Standing VOR gaze stabilization x 1, 30 sec horizontal  Hard to coordinate 3+/10, queasiness  Counter balance exercises: Feet apart>feet together EC>partial heel/toe 30 sec each, increased sway partial heel/toe  Walking along counter with EC  UE support, mild unsteadiness  Forward/back walking at [employer] picking up dog toy with squat x 3 Reports mild queasiness and unsteadiness  Seated bend down to target 5 reps Reports 4/10 symptoms of dizziness, queasiness -uses alcohol wipe to settle (after 3 minutes, still 4/10)  Gait 50 ft x 4 reps Decreased overall symptoms after gait     VESTIBULAR ASSESSMENT   GENERAL OBSERVATION: pt wears progressives    OCULOMOTOR EXAM: Ocular alignment: L  exotropia   Ocular ROM: WNL   Spontaneous Nystagmus: WNL   Gaze-Induced Nystagmus: WNL   Smooth Pursuits: WNL   Saccades: WNL   Convergence/Divergence: ~13 cm    VESTIBULAR - OCULAR REFLEX:    Slow VOR: Normal; limited ROM and c/o wooziness horizontal    VOR Cancellation: Normal; c/o mild wooziness    Head-Impulse Test: HIT Right: negative HIT Left: positive (this fatigued) *c/o mild dizziness  AUDITORY SCREEN:  Rinne Test WNL    POSITIONAL TESTING:  Right Roll Test: negative Left Roll Test: negative C/o wooziness upon sitting up  Right Loaded Dix-Hallpike modified over pillow: negative; c/o less wooziness sitting up Left Loaded Dix-Hallpike modified over pillow: negative; c/o wooziness and nausea upon sitting up    TODAY'S TREATMENT: 08/19/24 Activity Comments  Sitting horizontal VOR 30 seconds 5/10 dizziness   Sitting horizontal VOR cancellation 30 sec 6/10 dizziness                 PATIENT EDUCATION: Education details: 08/23/2024:  Continue current HEP and use of visual targets for stability/decrease in symptoms when reaching down to pick up objects from floor Person educated: Patient Education method: Explanation, Demonstration, Tactile cues, Verbal cues, and Handouts Education comprehension: verbalized understanding and returned demonstration   HOME EXERCISE PROGRAM Access Code: [email or web address] URL: [email or web address] Date: 08/19/2024 Prepared by: [facility] - Outpatient  Rehab - [facility]  Exercises - Seated Assisted Cervical Rotation with Towel  - 1 x daily - 5 x weekly - 2 sets - 10 reps - Mid-Lower Cervical Extension SNAG with Strap  - 1 x daily - 5 x weekly - 2 sets - 10 reps - Seated Bilateral Shoulder External Rotation with Resistance  - 1 x daily - 5 x weekly - 2 sets - 10 reps - Seated Shoulder Horizontal Abduction with Resistance  - 1 x daily - 5 x weekly - 2 sets - 10 reps - Standing Forward Step Taps with Counter Support  -  1 x  daily - 5 x weekly - 2 sets - 10 reps - Narrow Stance with Counter Support  - 1 x daily - 5 x weekly - 1 sets - 3 reps - 15-30 sec hold - Side Stepping with Counter Support  - 1 x daily - 5 x weekly - 1 sets - 3 reps - Cervical Retraction with Overpressure  - 1 x daily - 5 x weekly - 2 sets - 10 reps - 3 sec hold - Seated Left Head Turns Vestibular Habituation  - 1 x daily - 5 x weekly - 2 sets - 30 sec hold - Seated VOR Cancellation  - 1 x daily - 5 x weekly - 2 sets - 30 sec hold       Note: Objective measures were completed at Evaluation unless otherwise noted.  DIAGNOSTIC FINDINGS:  07/07/24 head neck CT angio: No evidence of acute intracranial abnormality. 2. Moderate chronic small vessel ischemic disease. 3. Mild atherosclerosis in the head and neck without a large vessel occlusion. 4. Severe left P2 stenosis.   PATIENT SURVEYS:  NDI:13/50, 26%  COGNITION: Overall cognitive status: Within functional limits for tasks assessed  SENSATION: Pt reports occasional N/T in fingertips while driving  POSTURE: shoulders elevated and neck slightly flexed with reduced lordosis   PALPATION: TTP over B scalenes and L UT; soft tissue restriction throughout cervical musculature    CERVICAL ROM:   Active ROM AROM (deg) eval  Flexion 39  Extension 25 *c/o pain  Right lateral flexion 15  Left lateral flexion 14  Right rotation 27 *c/o tight  Left rotation 22   (Blank rows = not tested)   UPPER EXTREMITY ROM:  Active ROM Right eval Left eval  Shoulder flexion [facility]  Shoulder extension    Shoulder abduction    Shoulder adduction    Shoulder extension    Shoulder internal rotation ~ T10 ~ T10  Shoulder external rotation Back of head Back of head  Elbow flexion    Elbow extension    Wrist flexion    Wrist extension    Wrist ulnar deviation    Wrist radial deviation    Wrist pronation    Wrist supination     (Blank rows = not tested)  UPPER EXTREMITY MMT:  MMT  Right eval Left eval  Shoulder flexion 4+ 4  Shoulder extension    Shoulder abduction 4- 4-  Shoulder adduction    Shoulder extension    Shoulder internal rotation 4- 4-  Shoulder external rotation 4- 4-  Middle trapezius    Lower trapezius    Elbow flexion    Elbow extension    Wrist flexion    Wrist extension    Wrist ulnar deviation    Wrist radial deviation    Wrist pronation    Wrist supination    Grip strength     (Blank rows = not tested)   Gait: Slightly unsteady and antalgic on R LE  TREATMENT DATE: 08/04/24  PATIENT EDUCATION:  Education details: prognosis, POC, HEP, edu on benefits of balance training to avoid falls and injury Person educated: Patient Education method: Explanation, Demonstration, Tactile cues, Verbal cues, and Handouts Education comprehension: verbalized understanding and returned demonstration  HOME EXERCISE PROGRAM: Access Code: [email or web address] URL: [email or web address] Date: 08/04/2024 Prepared by: [facility] - Outpatient  Rehab - [facility]  Exercises - Seated Assisted Cervical Rotation with Towel  - 1 x daily - 5 x weekly - 2 sets - 10 reps - Mid-Lower Cervical Extension SNAG with Strap  - 1 x daily - 5 x weekly - 2 sets - 10 reps - Seated Bilateral Shoulder External Rotation with Resistance  - 1 x daily - 5 x weekly - 2 sets - 10 reps - Seated Shoulder Horizontal Abduction with Resistance  - 1 x daily - 5 x weekly - 2 sets - 10 reps   ASSESSMENT:  CLINICAL IMPRESSION: Pt presents today with reports of improvement of dizziness symptoms with work on exercises over the weekend.. Skilled PT session focused on continued progression of vestibular exercises, VOR, habituation.  With attempts at standing progression of VOR and habituation with bending over to pick up objects (like dog toys), she has  queasiness as part of her symptoms.  Recommend pt continue current HEP, as her seated exercises are bringing on less symptoms than last visit, and we will try to progress more next visit. Mild nausea at end of session and pt reports no difficulty being able to leave session independently.  OBJECTIVE IMPAIRMENTS: Abnormal gait, decreased activity tolerance, decreased balance, difficulty walking, decreased ROM, decreased strength, increased muscle spasms, impaired flexibility, postural dysfunction, and pain.   ACTIVITY LIMITATIONS: carrying, lifting, bending, standing, squatting, sleeping, stairs, transfers, bathing, reach over head, hygiene/grooming, and locomotion level  PARTICIPATION LIMITATIONS: meal prep, cleaning, laundry, driving, shopping, community activity, and riding horse  PERSONAL FACTORS: Age, Past/current experiences, Time since onset of injury/illness/exacerbation, and 3+ comorbidities: Anxiety, depression, HLD, RBBB, R mastectomy are also affecting patient's functional outcome.   REHAB POTENTIAL: Good  CLINICAL DECISION MAKING: Evolving/moderate complexity  EVALUATION COMPLEXITY: Moderate   GOALS: Goals reviewed with patient? Yes  SHORT TERM GOALS: Target date: 08/25/2024  Patient to be independent with initial HEP. Baseline: HEP initiated Goal status: MET     LONG TERM GOALS: Target date: 09/15/2024  Patient to be independent with advanced HEP. Baseline: Not yet initiated  Goal status: IN PROGRESS  Patient to demonstrate B LE strength >/=4+/5.  Baseline: See above Goal status: IN PROGRESS  Patient to demonstrate cervical AROM improved by 10 degrees in all planes.  Baseline: see above Goal status: IN PROGRESS  Patient to report resolution of neck pain. Baseline: has pain Goal status: IN PROGRESS  Patient to score at least 23/30 on FGA in order to decrease risk of falls.  Baseline: 21/30 Goal status: IN PROGRESS  Patient to complete 5xSTS in <15 sec with  LRAD in order to decrease risk of falls.   Baseline: 21 sec Goal status: IN PROGRESS    PLAN:  PT FREQUENCY: 1-2x/week  PT DURATION: 6 weeks  PLANNED INTERVENTIONS: 97164- PT Re-evaluation, 97110-Therapeutic exercises, 97530- Therapeutic activity, [id number]- Neuromuscular re-education, 97535- Self Care, [id number]- Manual therapy, [id number]- Gait training, [phone]- Canalith repositioning, [phone] (1-2 muscles), [id number] (3+ muscles)- Dry Needling, Patient/Family education, Balance training, Stair training, Taping, Joint mobilization, Spinal mobilization, Vestibular training, Cryotherapy, and Moist heat  PLAN FOR NEXT SESSION: habituation with picking up object from floor; gaze stability, address visual dependence with  balance; , may benefit from manual therapy additional and progressing self-mobilization strategies.  Address balance   Greig Anon, PT 08/30/24 1:08 PM Phone: [phone] Fax: [phone]  [facility] at [facility] [address] [place], [place] [address] Phone # [phone] Fax # [phone]

## 2024-08-31 ENCOUNTER — Ambulatory Visit: Payer: PRIVATE HEALTH INSURANCE | Admitting: Physical Therapy

## 2024-09-07 ENCOUNTER — Encounter: Payer: Self-pay | Admitting: Physical Therapy

## 2024-09-07 ENCOUNTER — Ambulatory Visit: Payer: PRIVATE HEALTH INSURANCE | Admitting: Physical Therapy

## 2024-09-07 DIAGNOSIS — M542 Cervicalgia: Secondary | ICD-10-CM | POA: Diagnosis not present

## 2024-09-07 DIAGNOSIS — Z9181 History of falling: Secondary | ICD-10-CM

## 2024-09-07 DIAGNOSIS — R2689 Other abnormalities of gait and mobility: Secondary | ICD-10-CM | POA: Diagnosis not present

## 2024-09-07 NOTE — Therapy (Signed)
 OUTPATIENT PHYSICAL THERAPY CERVICAL TREATMENT   Patient Name: Kelly George MRN: [id number] DOB:1946/05/10, 78 y.o., female Today's Date: 09/07/2024  END OF SESSION:  PT End of Session - 09/07/24 0934     Visit Number 7    Number of Visits 13    Date for Recertification  09/15/24    Authorization Type Medicare/Generic Supplemental    PT Start Time 0935    PT Stop Time 1016    PT Time Calculation (min) 41 min    Equipment Utilized During Treatment Gait belt    Activity Tolerance Patient tolerated treatment well    Behavior During Therapy WFL for tasks assessed/performed               Past Medical History:  Diagnosis Date   Anxiety    Depression    Heart murmur    Hyperlipidemia    RBBB    Seasonal allergies    Past Surgical History:  Procedure Laterality Date   BREAST BIOPSY Right 10/2020   x2   BREAST EXCISIONAL BIOPSY Left    COLONOSCOPY  07/07/2001   MASTECTOMY Right 11/2020   MASTECTOMY W/ SENTINEL NODE BIOPSY Right 11/28/2020   Procedure: RIGHT MASTECTOMY WITH RIGHT AXILLARY SENTINEL LYMPH NODE BIOPSY;  Surgeon: Ebbie Cough, MD;  Location: [facility];  Service: General;  Laterality: Right;  RNFA, PEC BLOCK   SKIN SURGERY     DERM - nasal    TOOTH EXTRACTION     TUBAL LIGATION     Patient Active Problem List   Diagnosis Date Noted   Neck pain 07/05/2024   Hematuria 07/05/2024   Closed compression fracture of L5 vertebra (HCC) 10/17/2023   Breast cancer, right (HCC) 11/28/2020   Malignant neoplasm of upper-outer quadrant of right breast in female, estrogen receptor positive (HCC) 11/10/2020   Osteoarthritis of multiple joints 04/03/2020   Aortic atherosclerosis 12/03/2018   Dyslipidemia 12/03/2018   Precordial chest pain 07/07/2015   Pain in joint of right foot 05/16/2014   Vitamin D  deficiency 07/08/2013   Mitral valve prolapse syndrome, history of mild 01/05/2013   Seasonal allergies    Anxiety    Depression,  recurrent    Hyperlipidemia     PCP: Micheal Wolm ORN, MD   REFERRING PROVIDER: Skeet Juliene SAUNDERS, DO  REFERRING DIAG: [phone] (ICD-10-CM) - Occipital neuralgia of right side  THERAPY DIAG:  Cervicalgia  History of falling  Other abnormalities of gait and mobility  Rationale for Evaluation and Treatment: Rehabilitation  ONSET DATE: 1 month  SUBJECTIVE:  SUBJECTIVE STATEMENT: Things are improving, still having some cracking with neck motions.  Been working on balance.  Still having the dizziness.  Feel like I will be off balance when I bend down to put on dog's collar   Hand dominance: Right  PERTINENT HISTORY:  Anxiety, depression, HLD, RBBB, R mastectomy  PAIN:  Are you having pain? Yes: NPRS scale: 0/10 Pain location: neck Pain description: discomfort, stiffness Aggravating factors: turning head Relieving factors: relax, head straight, ibuprofen  PRECAUTIONS: Fall  RED FLAGS: None     WEIGHT BEARING RESTRICTIONS: No  FALLS:  Has patient fallen in last 6 months? Yes. Number of falls 3.   LIVING ENVIRONMENT: Lives with: lives alone Lives in: House/apartment Stairs: none Has following equipment at home: Grab bars  OCCUPATION: retired  PLOF: Independent  PATIENT GOALS: improve pain and be able to turn my neck  NEXT MD VISIT: 01/20/25  OBJECTIVE:     TODAY'S TREATMENT: 09/07/24 Activity Comments  Bending activities to pick up items from the floor and to simulate putting on dog's collar Cues for wide BOS slightly staggered feet position, no LOB  Golfer's lift to pick up items from floor 1 UE support at counter, good return demo  Forward/back step head motions up/down Harder with LLE as stance and RLE stepping, more dizziness looking up, 8/10 Used alcohol  swab to help settle nausea, settles after several minutes  Convergence exercises Pencil push ups with thumb-double target at approx 13 cm (unchanged since eval)  Neck ROM See below-pt c/o tightness still along R occiput and R side of neck          VESTIBULAR ASSESSMENT   GENERAL OBSERVATION: pt wears progressives    OCULOMOTOR EXAM: Ocular alignment: L exotropia   Ocular ROM: WNL   Spontaneous Nystagmus: WNL   Gaze-Induced Nystagmus: WNL   Smooth Pursuits: WNL   Saccades: WNL   Convergence/Divergence: ~13 cm    VESTIBULAR - OCULAR REFLEX:    Slow VOR: Normal; limited ROM and c/o wooziness horizontal    VOR Cancellation: Normal; c/o mild wooziness    Head-Impulse Test: HIT Right: negative HIT Left: positive (this fatigued) *c/o mild dizziness  AUDITORY SCREEN:  Rinne Test WNL    POSITIONAL TESTING:  Right Roll Test: negative Left Roll Test: negative C/o wooziness upon sitting up  Right Loaded Dix-Hallpike modified over pillow: negative; c/o less wooziness sitting up Left Loaded Dix-Hallpike modified over pillow: negative; c/o wooziness and nausea upon sitting up    TODAY'S TREATMENT: 08/19/24 Activity Comments  Sitting horizontal VOR 30 seconds 5/10 dizziness   Sitting horizontal VOR cancellation 30 sec 6/10 dizziness                 PATIENT EDUCATION: Education details: 08/23/2024:  Continue current HEP and use of visual targets for stability/decrease in symptoms when reaching down to pick up objects from floor Person educated: Patient Education method: Explanation, Demonstration, Tactile cues, Verbal cues, and Handouts Education comprehension: verbalized understanding and returned demonstration   HOME EXERCISE PROGRAM Access Code: [email or web address] URL: [email or web address] Date: 08/19/2024 Prepared by: [facility] - Outpatient  Rehab - [facility]  Exercises - Seated Assisted Cervical Rotation with Towel  - 1 x daily - 5 x weekly - 2 sets  - 10 reps - Mid-Lower Cervical Extension SNAG with Strap  - 1 x daily - 5 x weekly - 2 sets - 10 reps - Seated Bilateral Shoulder External Rotation with Resistance  - 1 x daily - 5  x weekly - 2 sets - 10 reps - Seated Shoulder Horizontal Abduction with Resistance  - 1 x daily - 5 x weekly - 2 sets - 10 reps - Standing Forward Step Taps with Counter Support  - 1 x daily - 5 x weekly - 2 sets - 10 reps - Narrow Stance with Counter Support  - 1 x daily - 5 x weekly - 1 sets - 3 reps - 15-30 sec hold - Side Stepping with Counter Support  - 1 x daily - 5 x weekly - 1 sets - 3 reps - Cervical Retraction with Overpressure  - 1 x daily - 5 x weekly - 2 sets - 10 reps - 3 sec hold - Seated Left Head Turns Vestibular Habituation  - 1 x daily - 5 x weekly - 2 sets - 30 sec hold - Seated VOR Cancellation  - 1 x daily - 5 x weekly - 2 sets - 30 sec hold       Note: Objective measures were completed at Evaluation unless otherwise noted.  DIAGNOSTIC FINDINGS:  07/07/24 head neck CT angio: No evidence of acute intracranial abnormality. 2. Moderate chronic small vessel ischemic disease. 3. Mild atherosclerosis in the head and neck without a large vessel occlusion. 4. Severe left P2 stenosis.   PATIENT SURVEYS:  NDI:13/50, 26%  COGNITION: Overall cognitive status: Within functional limits for tasks assessed  SENSATION: Pt reports occasional N/T in fingertips while driving  POSTURE: shoulders elevated and neck slightly flexed with reduced lordosis   PALPATION: TTP over B scalenes and L UT; soft tissue restriction throughout cervical musculature    CERVICAL ROM:   Active ROM AROM (deg) eval A/ROM 09/07/24  Flexion 39 40  Extension 25 *c/o pain 20  Right lateral flexion 15 20  Left lateral flexion 14 20  Right rotation 27 *c/o tight 36  Left rotation 22 30   (Blank rows = not tested)   UPPER EXTREMITY ROM:  Active ROM Right eval Left eval  Shoulder flexion [facility]  Shoulder  extension    Shoulder abduction    Shoulder adduction    Shoulder extension    Shoulder internal rotation ~ T10 ~ T10  Shoulder external rotation Back of head Back of head  Elbow flexion    Elbow extension    Wrist flexion    Wrist extension    Wrist ulnar deviation    Wrist radial deviation    Wrist pronation    Wrist supination     (Blank rows = not tested)  UPPER EXTREMITY MMT:  MMT Right eval Left eval  Shoulder flexion 4+ 4  Shoulder extension    Shoulder abduction 4- 4-  Shoulder adduction    Shoulder extension    Shoulder internal rotation 4- 4-  Shoulder external rotation 4- 4-  Middle trapezius    Lower trapezius    Elbow flexion    Elbow extension    Wrist flexion    Wrist extension    Wrist ulnar deviation    Wrist radial deviation    Wrist pronation    Wrist supination    Grip strength     (Blank rows = not tested)   Gait: Slightly unsteady and antalgic on R LE  TREATMENT DATE: 08/04/24  PATIENT EDUCATION:  Education details: prognosis, POC, HEP, edu on benefits of balance training to avoid falls and injury Person educated: Patient Education method: Explanation, Demonstration, Tactile cues, Verbal cues, and Handouts Education comprehension: verbalized understanding and returned demonstration  HOME EXERCISE PROGRAM: Access Code: [email or web address] URL: [email or web address] Date: 08/04/2024 Prepared by: [facility] - Outpatient  Rehab - [facility]  Exercises - Seated Assisted Cervical Rotation with Towel  - 1 x daily - 5 x weekly - 2 sets - 10 reps - Mid-Lower Cervical Extension SNAG with Strap  - 1 x daily - 5 x weekly - 2 sets - 10 reps - Seated Bilateral Shoulder External Rotation with Resistance  - 1 x daily - 5 x weekly - 2 sets - 10 reps - Seated Shoulder Horizontal Abduction with Resistance  - 1 x daily  - 5 x weekly - 2 sets - 10 reps   ASSESSMENT:  CLINICAL IMPRESSION: Pt presents today with reports of some improved neck motion, but still having dizziness at times. Skilled PT session focused on beginning to assess neck ROM measures, with improvements noted in rotation and lateral flexion; still limited with extension (tighter today than at eval).  Worked on balance activities to address pt's reported imbalance and sometimes dizziness with bending down/squatting to put collar on her dog.  She demo this with somewhat narrowed BOS and pitching forward onto toes.  Worked on wide BOS squats and golfer's lift as ways to improve this activity, with some success.  Worked also on forward/back stepping with head motions and looking up brings on increased dizziness and nausea.  She would continue to benefit from skilled PT to address neck motion/flexibility/pain, as well as dizziness/convergence/VOR for improved balance and overall functional mobility.  OBJECTIVE IMPAIRMENTS: Abnormal gait, decreased activity tolerance, decreased balance, difficulty walking, decreased ROM, decreased strength, increased muscle spasms, impaired flexibility, postural dysfunction, and pain.   ACTIVITY LIMITATIONS: carrying, lifting, bending, standing, squatting, sleeping, stairs, transfers, bathing, reach over head, hygiene/grooming, and locomotion level  PARTICIPATION LIMITATIONS: meal prep, cleaning, laundry, driving, shopping, community activity, and riding horse  PERSONAL FACTORS: Age, Past/current experiences, Time since onset of injury/illness/exacerbation, and 3+ comorbidities: Anxiety, depression, HLD, RBBB, R mastectomy are also affecting patient's functional outcome.   REHAB POTENTIAL: Good  CLINICAL DECISION MAKING: Evolving/moderate complexity  EVALUATION COMPLEXITY: Moderate   GOALS: Goals reviewed with patient? Yes  SHORT TERM GOALS: Target date: 08/25/2024  Patient to be independent with initial  HEP. Baseline: HEP initiated Goal status: MET     LONG TERM GOALS: Target date: 09/15/2024  Patient to be independent with advanced HEP. Baseline: Not yet initiated  Goal status: IN PROGRESS  Patient to demonstrate B LE strength >/=4+/5.  Baseline: See above Goal status: IN PROGRESS  Patient to demonstrate cervical AROM improved by 10 degrees in all planes.  Baseline: see above Goal status: IN PROGRESS  Patient to report resolution of neck pain. Baseline: has pain Goal status: IN PROGRESS  Patient to score at least 23/30 on FGA in order to decrease risk of falls.  Baseline: 21/30 Goal status: IN PROGRESS  Patient to complete 5xSTS in <15 sec with LRAD in order to decrease risk of falls.   Baseline: 21 sec Goal status: IN PROGRESS    PLAN:  PT FREQUENCY: 1-2x/week  PT DURATION: 6 weeks  PLANNED INTERVENTIONS: 97164- PT Re-evaluation, 97110-Therapeutic exercises, 97530- Therapeutic activity, [id number]- Neuromuscular re-education, 97535- Self Care, 02859- Manual therapy, [id number]- Gait training, [phone]- Canalith repositioning, [id number] (1-2 muscles), [phone] (3+  muscles)- Dry Needling, Patient/Family education, Balance training, Stair training, Taping, Joint mobilization, Spinal mobilization, Vestibular training, Cryotherapy, and Moist heat  PLAN FOR NEXT SESSION: Check about how she did with picking up objects from floor; Check LTGs and discuss POC.  May benefit from manual therapy additional and progressing self-mobilization strategies.  Address balance/dizziness   Greig Anon, PT 09/07/24 12:26 PM Phone: [phone] Fax: [phone]  [facility] at [facility] [address] [place], [place] [address] Phone # [phone] Fax # [phone]

## 2024-09-08 DIAGNOSIS — Z961 Presence of intraocular lens: Secondary | ICD-10-CM | POA: Diagnosis not present

## 2024-09-08 DIAGNOSIS — H35033 Hypertensive retinopathy, bilateral: Secondary | ICD-10-CM | POA: Diagnosis not present

## 2024-09-13 NOTE — Therapy (Signed)
 OUTPATIENT PHYSICAL THERAPY CERVICAL TREATMENT   Patient Name: Kelly George MRN: [id number] DOB:December 09, 1945, 78 y.o., female Today's Date: 09/13/2024  END OF SESSION:         Past Medical History:  Diagnosis Date   Anxiety    Depression    Heart murmur    Hyperlipidemia    RBBB    Seasonal allergies    Past Surgical History:  Procedure Laterality Date   BREAST BIOPSY Right 10/2020   x2   BREAST EXCISIONAL BIOPSY Left    COLONOSCOPY  07/07/2001   MASTECTOMY Right 11/2020   MASTECTOMY W/ SENTINEL NODE BIOPSY Right 11/28/2020   Procedure: RIGHT MASTECTOMY WITH RIGHT AXILLARY SENTINEL LYMPH NODE BIOPSY;  Surgeon: Ebbie Cough, MD;  Location: [facility];  Service: General;  Laterality: Right;  RNFA, PEC BLOCK   SKIN SURGERY     DERM - nasal    TOOTH EXTRACTION     TUBAL LIGATION     Patient Active Problem List   Diagnosis Date Noted   Neck pain 07/05/2024   Hematuria 07/05/2024   Closed compression fracture of L5 vertebra (HCC) 10/17/2023   Breast cancer, right (HCC) 11/28/2020   Malignant neoplasm of upper-outer quadrant of right breast in female, estrogen receptor positive (HCC) 11/10/2020   Osteoarthritis of multiple joints 04/03/2020   Aortic atherosclerosis 12/03/2018   Dyslipidemia 12/03/2018   Precordial chest pain 07/07/2015   Pain in joint of right foot 05/16/2014   Vitamin D  deficiency 07/08/2013   Mitral valve prolapse syndrome, history of mild 01/05/2013   Seasonal allergies    Anxiety    Depression, recurrent    Hyperlipidemia     PCP: Micheal Wolm ORN, MD   REFERRING PROVIDER: Skeet Juliene SAUNDERS, DO  REFERRING DIAG: [phone] (ICD-10-CM) - Occipital neuralgia of right side  THERAPY DIAG:  No diagnosis found.  Rationale for Evaluation and Treatment: Rehabilitation  ONSET DATE: 1 month  SUBJECTIVE:                                                                                                                                                                                                          SUBJECTIVE STATEMENT: Things are improving, still having some cracking with neck motions.  Been working on balance.  Still having the dizziness.  Feel like I will be off balance when I bend down to put on dog's collar   Hand dominance: Right  PERTINENT HISTORY:  Anxiety, depression, HLD, RBBB, R mastectomy  PAIN:  Are you having pain? Yes: NPRS scale: 0/10 Pain  location: neck Pain description: discomfort, stiffness Aggravating factors: turning head Relieving factors: relax, head straight, ibuprofen  PRECAUTIONS: Fall  RED FLAGS: None     WEIGHT BEARING RESTRICTIONS: No  FALLS:  Has patient fallen in last 6 months? Yes. Number of falls 3.   LIVING ENVIRONMENT: Lives with: lives alone Lives in: House/apartment Stairs: none Has following equipment at home: Grab bars  OCCUPATION: retired  PLOF: Independent  PATIENT GOALS: improve pain and be able to turn my neck  NEXT MD VISIT: 01/20/25  OBJECTIVE:      TODAY'S TREATMENT: 09/14/24 Activity Comments                         HOME EXERCISE PROGRAM Access Code: [email or web address] URL: [email or web address] Date: 08/19/2024 Prepared by: [facility] - Outpatient  Rehab - [facility]  Exercises - Seated Assisted Cervical Rotation with Towel  - 1 x daily - 5 x weekly - 2 sets - 10 reps - Mid-Lower Cervical Extension SNAG with Strap  - 1 x daily - 5 x weekly - 2 sets - 10 reps - Seated Bilateral Shoulder External Rotation with Resistance  - 1 x daily - 5 x weekly - 2 sets - 10 reps - Seated Shoulder Horizontal Abduction with Resistance  - 1 x daily - 5 x weekly - 2 sets - 10 reps - Standing Forward Step Taps with Counter Support  - 1 x daily - 5 x weekly - 2 sets - 10 reps - Narrow Stance with Counter Support  - 1 x daily - 5 x weekly - 1 sets - 3 reps - 15-30 sec hold - Side Stepping with Counter Support  - 1 x daily - 5 x  weekly - 1 sets - 3 reps - Cervical Retraction with Overpressure  - 1 x daily - 5 x weekly - 2 sets - 10 reps - 3 sec hold - Seated Left Head Turns Vestibular Habituation  - 1 x daily - 5 x weekly - 2 sets - 30 sec hold - Seated VOR Cancellation  - 1 x daily - 5 x weekly - 2 sets - 30 sec hold       Note: Objective measures were completed at Evaluation unless otherwise noted.  DIAGNOSTIC FINDINGS:  07/07/24 head neck CT angio: No evidence of acute intracranial abnormality. 2. Moderate chronic small vessel ischemic disease. 3. Mild atherosclerosis in the head and neck without a large vessel occlusion. 4. Severe left P2 stenosis.   PATIENT SURVEYS:  NDI:13/50, 26%  COGNITION: Overall cognitive status: Within functional limits for tasks assessed  SENSATION: Pt reports occasional N/T in fingertips while driving  POSTURE: shoulders elevated and neck slightly flexed with reduced lordosis   PALPATION: TTP over B scalenes and L UT; soft tissue restriction throughout cervical musculature    CERVICAL ROM:   Active ROM AROM (deg) eval A/ROM 09/07/24  Flexion 39 40  Extension 25 *c/o pain 20  Right lateral flexion 15 20  Left lateral flexion 14 20  Right rotation 27 *c/o tight 36  Left rotation 22 30   (Blank rows = not tested)   UPPER EXTREMITY ROM:  Active ROM Right eval Left eval  Shoulder flexion [facility]  Shoulder extension    Shoulder abduction    Shoulder adduction    Shoulder extension    Shoulder internal rotation ~ T10 ~ T10  Shoulder external rotation Back of head Back of head  Elbow flexion  Elbow extension    Wrist flexion    Wrist extension    Wrist ulnar deviation    Wrist radial deviation    Wrist pronation    Wrist supination     (Blank rows = not tested)  UPPER EXTREMITY MMT:  MMT Right eval Left eval  Shoulder flexion 4+ 4  Shoulder extension    Shoulder abduction 4- 4-  Shoulder adduction    Shoulder extension    Shoulder internal  rotation 4- 4-  Shoulder external rotation 4- 4-  Middle trapezius    Lower trapezius    Elbow flexion    Elbow extension    Wrist flexion    Wrist extension    Wrist ulnar deviation    Wrist radial deviation    Wrist pronation    Wrist supination    Grip strength     (Blank rows = not tested)   Gait: Slightly unsteady and antalgic on R LE  TREATMENT DATE: 08/04/24                                                                                                                                 PATIENT EDUCATION:  Education details: prognosis, POC, HEP, edu on benefits of balance training to avoid falls and injury Person educated: Patient Education method: Explanation, Demonstration, Tactile cues, Verbal cues, and Handouts Education comprehension: verbalized understanding and returned demonstration  HOME EXERCISE PROGRAM: Access Code: [email or web address] URL: [email or web address] Date: 08/04/2024 Prepared by: [facility] - Outpatient  Rehab - [facility]  Exercises - Seated Assisted Cervical Rotation with Towel  - 1 x daily - 5 x weekly - 2 sets - 10 reps - Mid-Lower Cervical Extension SNAG with Strap  - 1 x daily - 5 x weekly - 2 sets - 10 reps - Seated Bilateral Shoulder External Rotation with Resistance  - 1 x daily - 5 x weekly - 2 sets - 10 reps - Seated Shoulder Horizontal Abduction with Resistance  - 1 x daily - 5 x weekly - 2 sets - 10 reps   ASSESSMENT:  CLINICAL IMPRESSION: Pt presents today with reports of some improved neck motion, but still having dizziness at times. Skilled PT session focused on beginning to assess neck ROM measures, with improvements noted in rotation and lateral flexion; still limited with extension (tighter today than at eval).  Worked on balance activities to address pt's reported imbalance and sometimes dizziness with bending down/squatting to put collar on her dog.  She demo this with somewhat narrowed BOS and pitching forward onto  toes.  Worked on wide BOS squats and golfer's lift as ways to improve this activity, with some success.  Worked also on forward/back stepping with head motions and looking up brings on increased dizziness and nausea.  She would continue to benefit from skilled PT to address neck motion/flexibility/pain, as well as dizziness/convergence/VOR for improved balance and overall functional mobility.  OBJECTIVE IMPAIRMENTS: Abnormal gait,  decreased activity tolerance, decreased balance, difficulty walking, decreased ROM, decreased strength, increased muscle spasms, impaired flexibility, postural dysfunction, and pain.   ACTIVITY LIMITATIONS: carrying, lifting, bending, standing, squatting, sleeping, stairs, transfers, bathing, reach over head, hygiene/grooming, and locomotion level  PARTICIPATION LIMITATIONS: meal prep, cleaning, laundry, driving, shopping, community activity, and riding horse  PERSONAL FACTORS: Age, Past/current experiences, Time since onset of injury/illness/exacerbation, and 3+ comorbidities: Anxiety, depression, HLD, RBBB, R mastectomy are also affecting patient's functional outcome.   REHAB POTENTIAL: Good  CLINICAL DECISION MAKING: Evolving/moderate complexity  EVALUATION COMPLEXITY: Moderate   GOALS: Goals reviewed with patient? Yes  SHORT TERM GOALS: Target date: 08/25/2024  Patient to be independent with initial HEP. Baseline: HEP initiated Goal status: MET     LONG TERM GOALS: Target date: 09/15/2024  Patient to be independent with advanced HEP. Baseline: Not yet initiated  Goal status: IN PROGRESS  Patient to demonstrate B LE strength >/=4+/5.  Baseline: See above Goal status: IN PROGRESS  Patient to demonstrate cervical AROM improved by 10 degrees in all planes.  Baseline: see above Goal status: IN PROGRESS  Patient to report resolution of neck pain. Baseline: has pain Goal status: IN PROGRESS  Patient to score at least 23/30 on FGA in order to  decrease risk of falls.  Baseline: 21/30 Goal status: IN PROGRESS  Patient to complete 5xSTS in <15 sec with LRAD in order to decrease risk of falls.   Baseline: 21 sec Goal status: IN PROGRESS    PLAN:  PT FREQUENCY: 1-2x/week  PT DURATION: 6 weeks  PLANNED INTERVENTIONS: 97164- PT Re-evaluation, 97110-Therapeutic exercises, 97530- Therapeutic activity, [id number]- Neuromuscular re-education, 97535- Self Care, 02859- Manual therapy, [id number]- Gait training, [phone]- Canalith repositioning, [phone] (1-2 muscles), [id number] (3+ muscles)- Dry Needling, Patient/Family education, Balance training, Stair training, Taping, Joint mobilization, Spinal mobilization, Vestibular training, Cryotherapy, and Moist heat  PLAN FOR NEXT SESSION: Check about how she did with picking up objects from floor; Check LTGs and discuss POC.  May benefit from manual therapy additional and progressing self-mobilization strategies.  Address balance/dizziness

## 2024-09-14 ENCOUNTER — Encounter: Payer: Self-pay | Admitting: Physical Therapy

## 2024-09-14 ENCOUNTER — Ambulatory Visit: Payer: PRIVATE HEALTH INSURANCE | Attending: Neurology | Admitting: Physical Therapy

## 2024-09-14 DIAGNOSIS — R2689 Other abnormalities of gait and mobility: Secondary | ICD-10-CM | POA: Diagnosis present

## 2024-09-14 DIAGNOSIS — Z9181 History of falling: Secondary | ICD-10-CM | POA: Diagnosis present

## 2024-09-14 DIAGNOSIS — M542 Cervicalgia: Secondary | ICD-10-CM | POA: Diagnosis present

## 2024-09-15 ENCOUNTER — Encounter: Payer: Self-pay | Admitting: Family Medicine

## 2024-09-15 ENCOUNTER — Ambulatory Visit: Admitting: Family Medicine

## 2024-09-15 VITALS — BP 118/74 | HR 79 | Temp 98.7°F | Wt 177.8 lb

## 2024-09-15 DIAGNOSIS — I10 Essential (primary) hypertension: Secondary | ICD-10-CM | POA: Diagnosis not present

## 2024-09-15 DIAGNOSIS — F339 Major depressive disorder, recurrent, unspecified: Secondary | ICD-10-CM | POA: Diagnosis not present

## 2024-09-15 DIAGNOSIS — R400 Somnolence: Secondary | ICD-10-CM | POA: Diagnosis not present

## 2024-09-15 DIAGNOSIS — R5383 Other fatigue: Secondary | ICD-10-CM

## 2024-09-15 NOTE — Progress Notes (Signed)
 Established Patient Office Visit  Subjective   Patient ID: Kelly George, female    DOB: Apr 12, 1946  Age: 78 y.o. MRN: [id number]  Chief Complaint  Patient presents with   Medical Management of Chronic Issues    HPI   Kelly George is seen for medical follow-up.  We addressed multiple issues last visit.  She had some recent flank pain and urinalysis was clear.  She has not had any further flank pain since then.  She was having significant occipital headaches and CT angiogram head and neck no acute abnormalities.  Was on gabapentin  headaches are better at this time.  History of recurrent depression.  Recent PHQ 9 score of 11 on Lexapro  10 mg daily.  We added low-dose Wellbutrin  XL 150 mg daily and she took this for a solid month but did not see any improvement.  [place] [facility] [place]  She has a very good friend that lives next-door.  She feels like this has been a positive move for her and increased socialization has helped.  She feels like her depression is stable at this time on just Lexapro .  We discussed fatigue last visit.  B12 and thyroid  were normal.  She does relate that her friend has noted that she snores frequently at night.  She denies being studied for sleep apnea previously.  She does have some daytime somnolence especially early afternoons after lunch.  Tries to avoid regular daytime napping.  She has hypertension treated with losartan  50 mg daily.  Compliant with therapy.  Denies any recent chest pains or dizziness.  Past Medical History:  Diagnosis Date   Anxiety    Depression    Heart murmur    Hyperlipidemia    RBBB    Seasonal allergies    Past Surgical History:  Procedure Laterality Date   BREAST BIOPSY Right 10/2020   x2   BREAST EXCISIONAL BIOPSY Left    COLONOSCOPY  07/07/2001   MASTECTOMY Right 11/2020   MASTECTOMY W/ SENTINEL NODE BIOPSY Right 11/28/2020   Procedure: RIGHT MASTECTOMY WITH RIGHT AXILLARY SENTINEL  LYMPH NODE BIOPSY;  Surgeon: Ebbie Cough, MD;  Location: [facility];  Service: General;  Laterality: Right;  RNFA, PEC BLOCK   SKIN SURGERY     DERM - nasal    TOOTH EXTRACTION     TUBAL LIGATION      reports that she has never smoked. She has been exposed to tobacco smoke. She has never used smokeless tobacco. She reports current alcohol use. She reports that she does not use drugs. family history includes Alcohol abuse in her father; Ankylosing spondylitis in her daughter; Autoimmune disease in her daughter; Breast cancer (age of onset: 96) in her mother; COPD in her mother; Cancer in her father; Depression in her father and son; Diabetes in her father; Healthy in her son; Hearing loss in her maternal grandmother; Heart attack (age of onset: 63) in her father; High Cholesterol in her brother; Hyperlipidemia in her brother; Hypertension in her brother and maternal grandfather; Kidney disease in her maternal grandfather; Stroke in her maternal grandfather; Uterine cancer in her maternal aunt. Allergies  Allergen Reactions   Penicillins Rash   Tetracyclines & Related Palpitations    Review of Systems  Constitutional:  Positive for malaise/fatigue.  Eyes:  Negative for blurred vision.  Respiratory:  Negative for shortness of breath.   Cardiovascular:  Negative for chest pain.  Gastrointestinal:  Negative for abdominal pain.  Neurological:  Negative for dizziness, weakness and headaches.      Objective:     BP 118/74 (BP Location: Left Arm, Cuff Size: Normal)   Pulse 79   Temp 98.7 F (37.1 C) (Oral)   Wt 177 lb 12.8 oz (80.6 kg)   SpO2 97%   BMI 27.03 kg/m  BP Readings from Last 3 Encounters:  09/15/24 118/74  07/19/24 121/78  07/16/24 138/84   Wt Readings from Last 3 Encounters:  09/15/24 177 lb 12.8 oz (80.6 kg)  07/19/24 174 lb (78.9 kg)  07/16/24 174 lb 14.4 oz (79.3 kg)      Physical Exam Vitals reviewed.  Constitutional:      General: She is  not in acute distress.    Appearance: She is well-developed. She is not ill-appearing.  Eyes:     Pupils: Pupils are equal, round, and reactive to light.  Neck:     Thyroid : No thyromegaly.     Vascular: No JVD.  Cardiovascular:     Rate and Rhythm: Normal rate and regular rhythm.     Heart sounds:     No gallop.  Pulmonary:     Effort: Pulmonary effort is normal. No respiratory distress.     Breath sounds: Normal breath sounds. No wheezing or rales.  Musculoskeletal:     Cervical back: Neck supple.     Right lower leg: No edema.     Left lower leg: No edema.  Neurological:     Mental Status: She is alert.      No results found for any visits on 09/15/24.  Last CBC Lab Results  Component Value Date   WBC 4.9 07/07/2024   HGB 12.6 07/07/2024   HCT 37.9 07/07/2024   MCV 96.4 07/07/2024   MCH 32.1 07/07/2024   RDW 12.3 07/07/2024   PLT 224 07/07/2024   Last metabolic panel Lab Results  Component Value Date   GLUCOSE 89 07/07/2024   NA 140 07/07/2024   K 3.9 07/07/2024   CL 102 07/07/2024   CO2 25 07/07/2024   BUN 16 07/07/2024   CREATININE 0.64 07/07/2024   GFRNONAA >60 07/07/2024   CALCIUM 9.8 07/07/2024   PROT 6.6 07/07/2024   ALBUMIN 4.3 07/07/2024   LABGLOB 2.5 11/18/2019   AGRATIO 1.8 11/18/2019   BILITOT 0.6 07/07/2024   ALKPHOS 67 07/07/2024   AST 23 07/07/2024   ALT 16 07/07/2024   ANIONGAP 13 07/07/2024   Last lipids Lab Results  Component Value Date   CHOL 189 08/11/2023   HDL 87.30 08/11/2023   LDLCALC 87 08/11/2023   TRIG 74.0 08/11/2023   CHOLHDL 2 08/11/2023   Last hemoglobin A1c No results found for: HGBA1C Last thyroid  functions Lab Results  Component Value Date   TSH 1.39 07/16/2024   T4TOTAL 6.6 11/18/2019   Last vitamin B12 and Folate Lab Results  Component Value Date   VITAMINB12 292 07/16/2024      The ASCVD Risk score (Arnett DK, et al., 2019) failed to calculate for the following reasons:   The 2019 ASCVD risk  score is only valid for ages 55 to 60    Assessment & Plan:   #1 history of recurrent depression.  Recent PHQ-9 score of 11.  We added low-dose Wellbutrin  XL 150 mg daily to her Lexapro  but she did not see any improvement after a month.  She stopped Wellbutrin  and feels like her depression is overall stable which she attributes to recent change in living environment to some  extent.  Continue Lexapro  10 mg daily and follow-up for any worsening depression symptoms  #2 fatigue with daytime somnolence.  Reported history of snoring.  Patient concerned about possible obstructive sleep apnea.  Will place referral to pulmonary for sleep apnea evaluation.  Epworth sleep scale score today of 7  #3 hypertension.  Initial reading was up but improved significantly after rest.  Continue low-sodium diet and losartan  50 mg daily  Return in about 6 months (around 03/16/2025).    Wolm Scarlet, MD

## 2024-09-17 NOTE — Therapy (Incomplete)
 OUTPATIENT PHYSICAL THERAPY CERVICAL NOTE   Patient Name: Kelly George MRN: [id number] DOB:September 17, 1946, 78 y.o., female Today's Date: 09/17/2024    END OF SESSION:          Past Medical History:  Diagnosis Date   Anxiety    Depression    Heart murmur    Hyperlipidemia    RBBB    Seasonal allergies    Past Surgical History:  Procedure Laterality Date   BREAST BIOPSY Right 10/2020   x2   BREAST EXCISIONAL BIOPSY Left    COLONOSCOPY  07/07/2001   MASTECTOMY Right 11/2020   MASTECTOMY W/ SENTINEL NODE BIOPSY Right 11/28/2020   Procedure: RIGHT MASTECTOMY WITH RIGHT AXILLARY SENTINEL LYMPH NODE BIOPSY;  Surgeon: Ebbie Cough, MD;  Location: [facility];  Service: General;  Laterality: Right;  RNFA, PEC BLOCK   SKIN SURGERY     DERM - nasal    TOOTH EXTRACTION     TUBAL LIGATION     Patient Active Problem List   Diagnosis Date Noted   Hypertension 09/15/2024   Neck pain 07/05/2024   Hematuria 07/05/2024   Closed compression fracture of L5 vertebra (HCC) 10/17/2023   Breast cancer, right (HCC) 11/28/2020   Malignant neoplasm of upper-outer quadrant of right breast in female, estrogen receptor positive (HCC) 11/10/2020   Osteoarthritis of multiple joints 04/03/2020   Aortic atherosclerosis 12/03/2018   Dyslipidemia 12/03/2018   Precordial chest pain 07/07/2015   Pain in joint of right foot 05/16/2014   Vitamin D  deficiency 07/08/2013   Mitral valve prolapse syndrome, history of mild 01/05/2013   Seasonal allergies    Anxiety    Depression, recurrent    Hyperlipidemia     PCP: Micheal Wolm ORN, MD   REFERRING PROVIDER: Skeet Juliene SAUNDERS, DO  REFERRING DIAG: [phone] (ICD-10-CM) - Occipital neuralgia of right side  THERAPY DIAG:  No diagnosis found.  Rationale for Evaluation and Treatment: Rehabilitation  ONSET DATE: 1 month  SUBJECTIVE:                                                                                                                                                                                                          SUBJECTIVE STATEMENT: I'm not completely cured but I'm better. Reports improved dizziness, neck pain. Reports some remaining stiffness in her neck. Reports no pain with sleeping now. Pt reports some remaining dizziness and unsteadiness with bending but it is better.    Hand dominance: Right  PERTINENT HISTORY:  Anxiety, depression, HLD, RBBB, R mastectomy  PAIN:  Are you having pain? Yes: NPRS scale: 0/10 Pain location: neck Pain description: discomfort, stiffness Aggravating factors: turning head Relieving factors: relax, head straight, ibuprofen  PRECAUTIONS: Fall  RED FLAGS: None     WEIGHT BEARING RESTRICTIONS: No  FALLS:  Has patient fallen in last 6 months? Yes. Number of falls 3.   LIVING ENVIRONMENT: Lives with: lives alone Lives in: House/apartment Stairs: none Has following equipment at home: Grab bars  OCCUPATION: retired  PLOF: Independent  PATIENT GOALS: improve pain and be able to turn my neck  NEXT MD VISIT: 01/20/25  OBJECTIVE:     TODAY'S TREATMENT: 09/20/24 Activity Comments                         TODAY'S TREATMENT: 09/14/24 Activity Comments  FGA 22/30  5xSTS 20.21 sec without UEs with LOB on 1st rep   Standing head nods to targets 30 Mild dizziness   Sitting nose to knee R/L Mild plus dizziness   Romberg eyes closed, then added head turns/nods Mild-mod sway; edu to perform this in corner at home       UPPER EXTREMITY MMT:  MMT Right eval Left eval Right 09/14/24 Left 09/14/24  Shoulder flexion 4+ 4 4 4+  Shoulder extension      Shoulder abduction 4- 4- 4 4+  Shoulder adduction      Shoulder extension      Shoulder internal rotation 4- 4- 4+ 4+  Shoulder external rotation 4- 4- 4- 4-   (Blank rows = not tested)      PATIENT EDUCATION: Education details: exam findings and progress towards goals as well  as remaining impairments, POC, HEP update  Person educated: Patient Education method: Explanation, Demonstration, Tactile cues, Verbal cues, and Handouts Education comprehension: verbalized understanding and returned demonstration    HOME EXERCISE PROGRAM Access Code: [email or web address] URL: [email or web address] Date: 09/14/2024 Prepared by: [facility] - Outpatient  Rehab - [facility]  Exercises - Seated Assisted Cervical Rotation with Towel  - 1 x daily - 5 x weekly - 2 sets - 10 reps - Mid-Lower Cervical Extension SNAG with Strap  - 1 x daily - 5 x weekly - 2 sets - 10 reps - Seated Bilateral Shoulder External Rotation with Resistance  - 1 x daily - 5 x weekly - 2 sets - 10 reps - Seated Shoulder Horizontal Abduction with Resistance  - 1 x daily - 5 x weekly - 2 sets - 10 reps - Standing with Head Nod  - 1 x daily - 5 x weekly - 3 sets - 30 sec hold - Seated Nose to Left Knee Vestibular Habituation  - 1 x daily - 5 x weekly - 2 sets - 3-5 reps - Seated Nose to Right Knee Vestibular Habituation  - 1 x daily - 5 x weekly - 2 sets - 3-5 reps - Corner Balance Feet Together: Eyes Closed With Head Turns  - 1 x daily - 5 x weekly - 3 sets - 30 sec hold       Note: Objective measures were completed at Evaluation unless otherwise noted.  DIAGNOSTIC FINDINGS:  07/07/24 head neck CT angio: No evidence of acute intracranial abnormality. 2. Moderate chronic small vessel ischemic disease. 3. Mild atherosclerosis in the head and neck without a large vessel occlusion. 4. Severe left P2 stenosis.   PATIENT SURVEYS:  NDI:13/50, 26%  COGNITION: Overall cognitive status: Within functional limits for tasks assessed  SENSATION: Pt reports occasional  N/T in fingertips while driving  POSTURE: shoulders elevated and neck slightly flexed with reduced lordosis   PALPATION: TTP over B scalenes and L UT; soft tissue restriction throughout cervical musculature    CERVICAL ROM:    Active ROM AROM (deg) eval A/ROM 09/07/24  Flexion 39 40  Extension 25 *c/o pain 20  Right lateral flexion 15 20  Left lateral flexion 14 20  Right rotation 27 *c/o tight 36  Left rotation 22 30   (Blank rows = not tested)   UPPER EXTREMITY ROM:  Active ROM Right eval Left eval  Shoulder flexion [facility]  Shoulder extension    Shoulder abduction    Shoulder adduction    Shoulder extension    Shoulder internal rotation ~ T10 ~ T10  Shoulder external rotation Back of head Back of head  Elbow flexion    Elbow extension    Wrist flexion    Wrist extension    Wrist ulnar deviation    Wrist radial deviation    Wrist pronation    Wrist supination     (Blank rows = not tested)  UPPER EXTREMITY MMT:  MMT Right eval Left eval  Shoulder flexion 4+ 4  Shoulder extension    Shoulder abduction 4- 4-  Shoulder adduction    Shoulder extension    Shoulder internal rotation 4- 4-  Shoulder external rotation 4- 4-  Middle trapezius    Lower trapezius    Elbow flexion    Elbow extension    Wrist flexion    Wrist extension    Wrist ulnar deviation    Wrist radial deviation    Wrist pronation    Wrist supination    Grip strength     (Blank rows = not tested)   Gait: Slightly unsteady and antalgic on R LE  TREATMENT DATE: 08/04/24                                                                                                                                 PATIENT EDUCATION:  Education details: prognosis, POC, HEP, edu on benefits of balance training to avoid falls and injury Person educated: Patient Education method: Programmer, Multimedia, Demonstration, Tactile cues, Verbal cues, and Handouts Education comprehension: verbalized understanding and returned demonstration  HOME EXERCISE PROGRAM: Access Code: [email or web address] URL: [email or web address] Date: 08/04/2024 Prepared by: [facility] - Outpatient  Rehab - [facility]  Exercises - Seated Assisted  Cervical Rotation with Towel  - 1 x daily - 5 x weekly - 2 sets - 10 reps - Mid-Lower Cervical Extension SNAG with Strap  - 1 x daily - 5 x weekly - 2 sets - 10 reps - Seated Bilateral Shoulder External Rotation with Resistance  - 1 x daily - 5 x weekly - 2 sets - 10 reps - Seated Shoulder Horizontal Abduction with Resistance  - 1 x daily - 5 x weekly - 2 sets - 10 reps  ASSESSMENT:  CLINICAL IMPRESSION: Patient arrived to session with report of improved dizziness and neck pain. Reports some remaining stiffness in her neck. Session focused on goals check. Strength testing revealed improved B shoulder abduction and IR; remaining weakness in B ER. 5xSTS is marginally better. Patient scored 22/30 on FGA, demonstrating small improvement but still indicating an increased risk of falls. HEP was updated to reflect remaining challenges- pt reported understanding. Patient is progressing well towards goals but goals have not yet been met. Would benefit from additional skilled PT services 1x/weeks for 6 weeks to address remaining goals.  OBJECTIVE IMPAIRMENTS: Abnormal gait, decreased activity tolerance, decreased balance, difficulty walking, decreased ROM, decreased strength, increased muscle spasms, impaired flexibility, postural dysfunction, and pain.   ACTIVITY LIMITATIONS: carrying, lifting, bending, standing, squatting, sleeping, stairs, transfers, bathing, reach over head, hygiene/grooming, and locomotion level  PARTICIPATION LIMITATIONS: meal prep, cleaning, laundry, driving, shopping, community activity, and riding horse  PERSONAL FACTORS: Age, Past/current experiences, Time since onset of injury/illness/exacerbation, and 3+ comorbidities: Anxiety, depression, HLD, RBBB, R mastectomy are also affecting patient's functional outcome.   REHAB POTENTIAL: Good  CLINICAL DECISION MAKING: Evolving/moderate complexity  EVALUATION COMPLEXITY: Moderate   GOALS: Goals reviewed with patient?  Yes  SHORT TERM GOALS: Target date: 08/25/2024  Patient to be independent with initial HEP. Baseline: HEP initiated Goal status: MET     LONG TERM GOALS: Target date: 10/26/2024  Patient to be independent with advanced HEP. Baseline: Not yet initiated  Goal status: IN PROGRESS  Patient to demonstrate B LE strength >/=4+/5.  Baseline: See above; improved but not met 09/14/24 Goal status: IN PROGRESS 09/14/24  Patient to demonstrate cervical AROM improved by 10 degrees in all planes.  Baseline: improved but not quite met 09/07/24  Goal status: IN PROGRESS  Patient to report resolution of neck pain. Baseline: has pain; pt reports pain back to baseline 09/14/24 Goal status: MET  09/14/24  Patient to score at least 23/30 on FGA in order to decrease risk of falls.  Baseline: 21/30; 22/30 09/14/24   Goal status: IN PROGRESS 09/14/24   Patient to complete 5xSTS in <15 sec with LRAD in order to decrease risk of falls.   Baseline: 21 sec; 20.21 sec 09/14/24  Goal status: IN PROGRESS 09/14/24   Patient to report 75% improvement in dizziness and unsteadiness with bending tasks.  Baseline: c/o both  Goal status: INITIAL 09/14/24     PLAN:  PT FREQUENCY: 1x/week  PT DURATION: 6 weeks  PLANNED INTERVENTIONS: 02835- PT Re-evaluation, 97110-Therapeutic exercises, 97530- Therapeutic activity, 97112- Neuromuscular re-education, 97535- Self Care, 02859- Manual therapy, [phone]- Gait training, [phone]- Canalith repositioning, [phone] (1-2 muscles), [phone] (3+ muscles)- Dry Needling, Patient/Family education, Balance training, Stair training, Taping, Joint mobilization, Spinal mobilization, Vestibular training, Cryotherapy, and Moist heat  PLAN FOR NEXT SESSION: Continue working on cervical ROM (may benefit from manual therapy additional and progressing self-mobilization strategies), shoulder ER strength, and balance with EC and head turns    Louana Terrilyn Christians, [place], DPT 09/17/24 9:42 AM  [facility] [address] [place], [place] [address] Phone # [phone] Fax # [phone]

## 2024-09-18 ENCOUNTER — Other Ambulatory Visit: Payer: Self-pay | Admitting: Family Medicine

## 2024-09-20 ENCOUNTER — Ambulatory Visit: Admitting: Physical Therapy

## 2024-09-22 DIAGNOSIS — Z23 Encounter for immunization: Secondary | ICD-10-CM | POA: Diagnosis not present

## 2024-09-23 NOTE — Therapy (Incomplete)
 OUTPATIENT PHYSICAL THERAPY CERVICAL NOTE   Patient Name: Kelly George MRN: [id number] DOB:07/31/1946, 78 y.o., female Today's Date: 09/23/2024    END OF SESSION:          Past Medical History:  Diagnosis Date   Anxiety    Depression    Heart murmur    Hyperlipidemia    RBBB    Seasonal allergies    Past Surgical History:  Procedure Laterality Date   BREAST BIOPSY Right 10/2020   x2   BREAST EXCISIONAL BIOPSY Left    COLONOSCOPY  07/07/2001   MASTECTOMY Right 11/2020   MASTECTOMY W/ SENTINEL NODE BIOPSY Right 11/28/2020   Procedure: RIGHT MASTECTOMY WITH RIGHT AXILLARY SENTINEL LYMPH NODE BIOPSY;  Surgeon: Ebbie Cough, MD;  Location: [facility];  Service: General;  Laterality: Right;  RNFA, PEC BLOCK   SKIN SURGERY     DERM - nasal    TOOTH EXTRACTION     TUBAL LIGATION     Patient Active Problem List   Diagnosis Date Noted   Hypertension 09/15/2024   Neck pain 07/05/2024   Hematuria 07/05/2024   Closed compression fracture of L5 vertebra (HCC) 10/17/2023   Breast cancer, right (HCC) 11/28/2020   Malignant neoplasm of upper-outer quadrant of right breast in female, estrogen receptor positive (HCC) 11/10/2020   Osteoarthritis of multiple joints 04/03/2020   Aortic atherosclerosis 12/03/2018   Dyslipidemia 12/03/2018   Precordial chest pain 07/07/2015   Pain in joint of right foot 05/16/2014   Vitamin D  deficiency 07/08/2013   Mitral valve prolapse syndrome, history of mild 01/05/2013   Seasonal allergies    Anxiety    Depression, recurrent    Hyperlipidemia     PCP: Micheal Wolm ORN, MD   REFERRING PROVIDER: Skeet Juliene SAUNDERS, DO  REFERRING DIAG: [phone] (ICD-10-CM) - Occipital neuralgia of right side  THERAPY DIAG:  No diagnosis found.  Rationale for Evaluation and Treatment: Rehabilitation  ONSET DATE: 1 month  SUBJECTIVE:                                                                                                                                                                                                          SUBJECTIVE STATEMENT: I'm not completely cured but I'm better. Reports improved dizziness, neck pain. Reports some remaining stiffness in her neck. Reports no pain with sleeping now. Pt reports some remaining dizziness and unsteadiness with bending but it is better.    Hand dominance: Right  PERTINENT HISTORY:  Anxiety, depression, HLD, RBBB, R mastectomy  PAIN:  Are you having pain? Yes: NPRS scale: 0/10 Pain location: neck Pain description: discomfort, stiffness Aggravating factors: turning head Relieving factors: relax, head straight, ibuprofen  PRECAUTIONS: Fall  RED FLAGS: None     WEIGHT BEARING RESTRICTIONS: No  FALLS:  Has patient fallen in last 6 months? Yes. Number of falls 3.   LIVING ENVIRONMENT: Lives with: lives alone Lives in: House/apartment Stairs: none Has following equipment at home: Grab bars  OCCUPATION: retired  PLOF: Independent  PATIENT GOALS: improve pain and be able to turn my neck  NEXT MD VISIT: 01/20/25  OBJECTIVE:     TODAY'S TREATMENT: 09/28/24 Activity Comments                         TODAY'S TREATMENT: 09/14/24 Activity Comments  FGA 22/30  5xSTS 20.21 sec without UEs with LOB on 1st rep   Standing head nods to targets 30 Mild dizziness   Sitting nose to knee R/L Mild plus dizziness   Romberg eyes closed, then added head turns/nods Mild-mod sway; edu to perform this in corner at home       UPPER EXTREMITY MMT:  MMT Right eval Left eval Right 09/14/24 Left 09/14/24  Shoulder flexion 4+ 4 4 4+  Shoulder extension      Shoulder abduction 4- 4- 4 4+  Shoulder adduction      Shoulder extension      Shoulder internal rotation 4- 4- 4+ 4+  Shoulder external rotation 4- 4- 4- 4-   (Blank rows = not tested)      PATIENT EDUCATION: Education details: exam findings and progress towards goals as  well as remaining impairments, POC, HEP update  Person educated: Patient Education method: Explanation, Demonstration, Tactile cues, Verbal cues, and Handouts Education comprehension: verbalized understanding and returned demonstration    HOME EXERCISE PROGRAM Access Code: [email or web address] URL: [email or web address] Date: 09/14/2024 Prepared by: [facility] - Outpatient  Rehab - [facility]  Exercises - Seated Assisted Cervical Rotation with Towel  - 1 x daily - 5 x weekly - 2 sets - 10 reps - Mid-Lower Cervical Extension SNAG with Strap  - 1 x daily - 5 x weekly - 2 sets - 10 reps - Seated Bilateral Shoulder External Rotation with Resistance  - 1 x daily - 5 x weekly - 2 sets - 10 reps - Seated Shoulder Horizontal Abduction with Resistance  - 1 x daily - 5 x weekly - 2 sets - 10 reps - Standing with Head Nod  - 1 x daily - 5 x weekly - 3 sets - 30 sec hold - Seated Nose to Left Knee Vestibular Habituation  - 1 x daily - 5 x weekly - 2 sets - 3-5 reps - Seated Nose to Right Knee Vestibular Habituation  - 1 x daily - 5 x weekly - 2 sets - 3-5 reps - Corner Balance Feet Together: Eyes Closed With Head Turns  - 1 x daily - 5 x weekly - 3 sets - 30 sec hold       Note: Objective measures were completed at Evaluation unless otherwise noted.  DIAGNOSTIC FINDINGS:  07/07/24 head neck CT angio: No evidence of acute intracranial abnormality. 2. Moderate chronic small vessel ischemic disease. 3. Mild atherosclerosis in the head and neck without a large vessel occlusion. 4. Severe left P2 stenosis.   PATIENT SURVEYS:  NDI:13/50, 26%  COGNITION: Overall cognitive status: Within functional limits for tasks assessed  SENSATION: Pt reports occasional  N/T in fingertips while driving  POSTURE: shoulders elevated and neck slightly flexed with reduced lordosis   PALPATION: TTP over B scalenes and L UT; soft tissue restriction throughout cervical musculature    CERVICAL ROM:    Active ROM AROM (deg) eval A/ROM 09/07/24  Flexion 39 40  Extension 25 *c/o pain 20  Right lateral flexion 15 20  Left lateral flexion 14 20  Right rotation 27 *c/o tight 36  Left rotation 22 30   (Blank rows = not tested)   UPPER EXTREMITY ROM:  Active ROM Right eval Left eval  Shoulder flexion [facility]  Shoulder extension    Shoulder abduction    Shoulder adduction    Shoulder extension    Shoulder internal rotation ~ T10 ~ T10  Shoulder external rotation Back of head Back of head  Elbow flexion    Elbow extension    Wrist flexion    Wrist extension    Wrist ulnar deviation    Wrist radial deviation    Wrist pronation    Wrist supination     (Blank rows = not tested)  UPPER EXTREMITY MMT:  MMT Right eval Left eval  Shoulder flexion 4+ 4  Shoulder extension    Shoulder abduction 4- 4-  Shoulder adduction    Shoulder extension    Shoulder internal rotation 4- 4-  Shoulder external rotation 4- 4-  Middle trapezius    Lower trapezius    Elbow flexion    Elbow extension    Wrist flexion    Wrist extension    Wrist ulnar deviation    Wrist radial deviation    Wrist pronation    Wrist supination    Grip strength     (Blank rows = not tested)   Gait: Slightly unsteady and antalgic on R LE  TREATMENT DATE: 08/04/24                                                                                                                                 PATIENT EDUCATION:  Education details: prognosis, POC, HEP, edu on benefits of balance training to avoid falls and injury Person educated: Patient Education method: Programmer, Multimedia, Demonstration, Tactile cues, Verbal cues, and Handouts Education comprehension: verbalized understanding and returned demonstration  HOME EXERCISE PROGRAM: Access Code: [email or web address] URL: [email or web address] Date: 08/04/2024 Prepared by: [facility] - Outpatient  Rehab - [facility]  Exercises - Seated Assisted  Cervical Rotation with Towel  - 1 x daily - 5 x weekly - 2 sets - 10 reps - Mid-Lower Cervical Extension SNAG with Strap  - 1 x daily - 5 x weekly - 2 sets - 10 reps - Seated Bilateral Shoulder External Rotation with Resistance  - 1 x daily - 5 x weekly - 2 sets - 10 reps - Seated Shoulder Horizontal Abduction with Resistance  - 1 x daily - 5 x weekly - 2 sets - 10 reps  ASSESSMENT:  CLINICAL IMPRESSION: Patient arrived to session with report of improved dizziness and neck pain. Reports some remaining stiffness in her neck. Session focused on goals check. Strength testing revealed improved B shoulder abduction and IR; remaining weakness in B ER. 5xSTS is marginally better. Patient scored 22/30 on FGA, demonstrating small improvement but still indicating an increased risk of falls. HEP was updated to reflect remaining challenges- pt reported understanding. Patient is progressing well towards goals but goals have not yet been met. Would benefit from additional skilled PT services 1x/weeks for 6 weeks to address remaining goals.  OBJECTIVE IMPAIRMENTS: Abnormal gait, decreased activity tolerance, decreased balance, difficulty walking, decreased ROM, decreased strength, increased muscle spasms, impaired flexibility, postural dysfunction, and pain.   ACTIVITY LIMITATIONS: carrying, lifting, bending, standing, squatting, sleeping, stairs, transfers, bathing, reach over head, hygiene/grooming, and locomotion level  PARTICIPATION LIMITATIONS: meal prep, cleaning, laundry, driving, shopping, community activity, and riding horse  PERSONAL FACTORS: Age, Past/current experiences, Time since onset of injury/illness/exacerbation, and 3+ comorbidities: Anxiety, depression, HLD, RBBB, R mastectomy are also affecting patient's functional outcome.   REHAB POTENTIAL: Good  CLINICAL DECISION MAKING: Evolving/moderate complexity  EVALUATION COMPLEXITY: Moderate   GOALS: Goals reviewed with patient?  Yes  SHORT TERM GOALS: Target date: 08/25/2024  Patient to be independent with initial HEP. Baseline: HEP initiated Goal status: MET     LONG TERM GOALS: Target date: 10/26/2024  Patient to be independent with advanced HEP. Baseline: Not yet initiated  Goal status: IN PROGRESS  Patient to demonstrate B LE strength >/=4+/5.  Baseline: See above; improved but not met 09/14/24 Goal status: IN PROGRESS 09/14/24  Patient to demonstrate cervical AROM improved by 10 degrees in all planes.  Baseline: improved but not quite met 09/07/24  Goal status: IN PROGRESS  Patient to report resolution of neck pain. Baseline: has pain; pt reports pain back to baseline 09/14/24 Goal status: MET  09/14/24  Patient to score at least 23/30 on FGA in order to decrease risk of falls.  Baseline: 21/30; 22/30 09/14/24   Goal status: IN PROGRESS 09/14/24   Patient to complete 5xSTS in <15 sec with LRAD in order to decrease risk of falls.   Baseline: 21 sec; 20.21 sec 09/14/24  Goal status: IN PROGRESS 09/14/24   Patient to report 75% improvement in dizziness and unsteadiness with bending tasks.  Baseline: c/o both  Goal status: INITIAL 09/14/24     PLAN:  PT FREQUENCY: 1x/week  PT DURATION: 6 weeks  PLANNED INTERVENTIONS: 02835- PT Re-evaluation, 97110-Therapeutic exercises, 97530- Therapeutic activity, 97112- Neuromuscular re-education, 97535- Self Care, 02859- Manual therapy, [phone]- Gait training, [phone]- Canalith repositioning, [phone] (1-2 muscles), [phone] (3+ muscles)- Dry Needling, Patient/Family education, Balance training, Stair training, Taping, Joint mobilization, Spinal mobilization, Vestibular training, Cryotherapy, and Moist heat  PLAN FOR NEXT SESSION: Continue working on cervical ROM (may benefit from manual therapy additional and progressing self-mobilization strategies), shoulder ER strength, and balance with EC and head turns    Louana Terrilyn Christians, [place], DPT 09/23/2024 10:28  AM  [facility] at [facility] [address], [address] [place], [place] [address] Phone # [phone] Fax # [phone]

## 2024-09-24 ENCOUNTER — Other Ambulatory Visit: Payer: Self-pay | Admitting: Family Medicine

## 2024-09-24 DIAGNOSIS — E7800 Pure hypercholesterolemia, unspecified: Secondary | ICD-10-CM

## 2024-09-28 ENCOUNTER — Ambulatory Visit: Admitting: Physical Therapy

## 2024-10-04 ENCOUNTER — Ambulatory Visit (INDEPENDENT_AMBULATORY_CARE_PROVIDER_SITE_OTHER): Payer: Medicare HMO

## 2024-10-04 VITALS — BP 130/62 | HR 83 | Temp 98.2°F | Ht 68.0 in | Wt 189.0 lb

## 2024-10-04 DIAGNOSIS — Z0000 Encounter for general adult medical examination without abnormal findings: Secondary | ICD-10-CM

## 2024-10-04 NOTE — Therapy (Signed)
 " OUTPATIENT PHYSICAL THERAPY CERVICAL NOTE   Patient Name: Kelly George MRN: [id number] DOB:10/17/1945, 78 y.o., female Today's Date: 10/05/2024    END OF SESSION:  PT End of Session - 10/05/24 1055     Visit Number 9    Number of Visits 14    Date for Recertification  10/26/24    Authorization Type Medicare/Generic Supplemental    PT Start Time 1016    PT Stop Time 1055    PT Time Calculation (min) 39 min    Activity Tolerance Patient tolerated treatment well    Behavior During Therapy [facility] for tasks assessed/performed                 Past Medical History:  Diagnosis Date   Anxiety    Depression    Heart murmur    Hyperlipidemia    RBBB    Seasonal allergies    Past Surgical History:  Procedure Laterality Date   BREAST BIOPSY Right 10/2020   x2   BREAST EXCISIONAL BIOPSY Left    COLONOSCOPY  07/07/2001   MASTECTOMY Right 11/2020   MASTECTOMY W/ SENTINEL NODE BIOPSY Right 11/28/2020   Procedure: RIGHT MASTECTOMY WITH RIGHT AXILLARY SENTINEL LYMPH NODE BIOPSY;  Surgeon: Ebbie Cough, MD;  Location: [facility];  Service: General;  Laterality: Right;  RNFA, PEC BLOCK   SKIN SURGERY     DERM - nasal    TOOTH EXTRACTION     TUBAL LIGATION     Patient Active Problem List   Diagnosis Date Noted   Hypertension 09/15/2024   Neck pain 07/05/2024   Hematuria 07/05/2024   Closed compression fracture of L5 vertebra (HCC) 10/17/2023   Breast cancer, right (HCC) 11/28/2020   Malignant neoplasm of upper-outer quadrant of right breast in female, estrogen receptor positive (HCC) 11/10/2020   Osteoarthritis of multiple joints 04/03/2020   Aortic atherosclerosis 12/03/2018   Dyslipidemia 12/03/2018   Precordial chest pain 07/07/2015   Pain in joint of right foot 05/16/2014   Vitamin D  deficiency 07/08/2013   Mitral valve prolapse syndrome, history of mild 01/05/2013   Seasonal allergies    Anxiety    Depression, recurrent     Hyperlipidemia     PCP: Micheal Wolm ORN, MD   REFERRING PROVIDER: Skeet Juliene SAUNDERS, DO  REFERRING DIAG: [phone] (ICD-10-CM) - Occipital neuralgia of right side  THERAPY DIAG:  Cervicalgia  History of falling  Other abnormalities of gait and mobility  Rationale for Evaluation and Treatment: Rehabilitation  ONSET DATE: 1 month  SUBJECTIVE:  SUBJECTIVE STATEMENT: I feel like there's improvement. Turning the head is better, but it still hurts when turning to the R. Dizziness and imbalance has good days and bad days but it is improving. Reports that she is taking exercise classes at [facility] Really enjoying it.    Hand dominance: Right  PERTINENT HISTORY:  Anxiety, depression, HLD, RBBB, R mastectomy  PAIN:  Are you having pain? Yes: NPRS scale: 0/10 Pain location: neck Pain description: discomfort, stiffness Aggravating factors: turning head Relieving factors: relax, head straight, ibuprofen  PRECAUTIONS: Fall  RED FLAGS: None     WEIGHT BEARING RESTRICTIONS: No  FALLS:  Has patient fallen in last 6 months? Yes. Number of falls 3.   LIVING ENVIRONMENT: Lives with: lives alone Lives in: House/apartment Stairs: none Has following equipment at home: Grab bars  OCCUPATION: retired  PLOF: Independent  PATIENT GOALS: improve pain and be able to turn my neck  NEXT MD VISIT: 01/20/25  OBJECTIVE:     TODAY'S TREATMENT: 10/05/24 Activity Comments  R/L shoulder ER green TB 2x10 each side Cueing for positioning, pain-free ROM. Harder on R side  prone over pball horizontal ABD 3# 2x10  Good form. Pt reports good tolerance for this positioned thus provided modifications for positioning during her exercise classes   scaption 3# 2x10 Assist for  proper positioning   STM and manual TPR, grade III cervical central Pas and R/L translation mobs, R cervical rotation MET followed by gentle PROM (performed supine and sitting) All performed to tolerance. TTP in B suboccipitals and some discomfort with R rotation           PATIENT EDUCATION: Education details: discussed POC- patient reports ready for DC 12/30 Person educated: Patient Education method: Explanation Education comprehension: verbalized understanding      HOME EXERCISE PROGRAM Access Code: [email or web address] URL: [email or web address] Date: 09/14/2024 Prepared by: [facility] - Outpatient  Rehab - [facility]  Exercises - Seated Assisted Cervical Rotation with Towel  - 1 x daily - 5 x weekly - 2 sets - 10 reps - Mid-Lower Cervical Extension SNAG with Strap  - 1 x daily - 5 x weekly - 2 sets - 10 reps - Seated Bilateral Shoulder External Rotation with Resistance  - 1 x daily - 5 x weekly - 2 sets - 10 reps - Seated Shoulder Horizontal Abduction with Resistance  - 1 x daily - 5 x weekly - 2 sets - 10 reps - Standing with Head Nod  - 1 x daily - 5 x weekly - 3 sets - 30 sec hold - Seated Nose to Left Knee Vestibular Habituation  - 1 x daily - 5 x weekly - 2 sets - 3-5 reps - Seated Nose to Right Knee Vestibular Habituation  - 1 x daily - 5 x weekly - 2 sets - 3-5 reps - Corner Balance Feet Together: Eyes Closed With Head Turns  - 1 x daily - 5 x weekly - 3 sets - 30 sec hold       Note: Objective measures were completed at Evaluation unless otherwise noted.  DIAGNOSTIC FINDINGS:  07/07/24 head neck CT angio: No evidence of acute intracranial abnormality. 2. Moderate chronic small vessel ischemic disease. 3. Mild atherosclerosis in the head and neck without a large vessel occlusion. 4. Severe left P2 stenosis.   PATIENT SURVEYS:  NDI:13/50, 26%  COGNITION: Overall cognitive status: Within functional limits for tasks assessed  SENSATION: Pt reports  occasional N/T in fingertips while driving  POSTURE: shoulders elevated and neck slightly flexed with reduced lordosis   PALPATION: TTP over B scalenes and L UT; soft tissue restriction throughout cervical musculature    CERVICAL ROM:   Active ROM AROM (deg) eval A/ROM 09/07/24  Flexion 39 40  Extension 25 *c/o pain 20  Right lateral flexion 15 20  Left lateral flexion 14 20  Right rotation 27 *c/o tight 36  Left rotation 22 30   (Blank rows = not tested)   UPPER EXTREMITY ROM:  Active ROM Right eval Left eval  Shoulder flexion [facility]  Shoulder extension    Shoulder abduction    Shoulder adduction    Shoulder extension    Shoulder internal rotation ~ T10 ~ T10  Shoulder external rotation Back of head Back of head  Elbow flexion    Elbow extension    Wrist flexion    Wrist extension    Wrist ulnar deviation    Wrist radial deviation    Wrist pronation    Wrist supination     (Blank rows = not tested)  UPPER EXTREMITY MMT:  MMT Right eval Left eval  Shoulder flexion 4+ 4  Shoulder extension    Shoulder abduction 4- 4-  Shoulder adduction    Shoulder extension    Shoulder internal rotation 4- 4-  Shoulder external rotation 4- 4-  Middle trapezius    Lower trapezius    Elbow flexion    Elbow extension    Wrist flexion    Wrist extension    Wrist ulnar deviation    Wrist radial deviation    Wrist pronation    Wrist supination    Grip strength     (Blank rows = not tested)   Gait: Slightly unsteady and antalgic on R LE  TREATMENT DATE: 08/04/24                                                                                                                                 PATIENT EDUCATION:  Education details: prognosis, POC, HEP, edu on benefits of balance training to avoid falls and injury Person educated: Patient Education method: Programmer, Multimedia, Demonstration, Tactile cues, Verbal cues, and Handouts Education comprehension: verbalized  understanding and returned demonstration  HOME EXERCISE PROGRAM: Access Code: [email or web address] URL: [email or web address] Date: 08/04/2024 Prepared by: [facility] - Outpatient  Rehab - [facility]  Exercises - Seated Assisted Cervical Rotation with Towel  - 1 x daily - 5 x weekly - 2 sets - 10 reps - Mid-Lower Cervical Extension SNAG with Strap  - 1 x daily - 5 x weekly - 2 sets - 10 reps - Seated Bilateral Shoulder External Rotation with Resistance  - 1 x daily - 5 x weekly - 2 sets - 10 reps - Seated Shoulder Horizontal Abduction with Resistance  - 1 x daily - 5 x weekly - 2 sets - 10 reps   ASSESSMENT:  CLINICAL IMPRESSION: Patient arrived to  session with report of improvement in neck pain/stiffness but still limited when turning R. Session focused on periscapular and RTC strengthening. Increased difficulty evident on R side but patient was able to tolerate all activities without pain today. MT addressed cervical joint mobility and soft tissue restriction, performed to tolerance. Patient tolerated session well and without complaints at end of appointment. Will be ready for DC next session.  OBJECTIVE IMPAIRMENTS: Abnormal gait, decreased activity tolerance, decreased balance, difficulty walking, decreased ROM, decreased strength, increased muscle spasms, impaired flexibility, postural dysfunction, and pain.   ACTIVITY LIMITATIONS: carrying, lifting, bending, standing, squatting, sleeping, stairs, transfers, bathing, reach over head, hygiene/grooming, and locomotion level  PARTICIPATION LIMITATIONS: meal prep, cleaning, laundry, driving, shopping, community activity, and riding horse  PERSONAL FACTORS: Age, Past/current experiences, Time since onset of injury/illness/exacerbation, and 3+ comorbidities: Anxiety, depression, HLD, RBBB, R mastectomy are also affecting patient's functional outcome.   REHAB POTENTIAL: Good  CLINICAL DECISION MAKING: Evolving/moderate  complexity  EVALUATION COMPLEXITY: Moderate   GOALS: Goals reviewed with patient? Yes  SHORT TERM GOALS: Target date: 08/25/2024  Patient to be independent with initial HEP. Baseline: HEP initiated Goal status: MET     LONG TERM GOALS: Target date: 10/26/2024  Patient to be independent with advanced HEP. Baseline: Not yet initiated  Goal status: IN PROGRESS  Patient to demonstrate B LE strength >/=4+/5.  Baseline: See above; improved but not met 09/14/24 Goal status: IN PROGRESS 09/14/24  Patient to demonstrate cervical AROM improved by 10 degrees in all planes.  Baseline: improved but not quite met 09/07/24  Goal status: IN PROGRESS  Patient to report resolution of neck pain. Baseline: has pain; pt reports pain back to baseline 09/14/24 Goal status: MET  09/14/24  Patient to score at least 23/30 on FGA in order to decrease risk of falls.  Baseline: 21/30; 22/30 09/14/24   Goal status: IN PROGRESS 09/14/24   Patient to complete 5xSTS in <15 sec with LRAD in order to decrease risk of falls.   Baseline: 21 sec; 20.21 sec 09/14/24  Goal status: IN PROGRESS 09/14/24   Patient to report 75% improvement in dizziness and unsteadiness with bending tasks.  Baseline: c/o both  Goal status: INITIAL 09/14/24     PLAN:  PT FREQUENCY: 1x/week  PT DURATION: 6 weeks  PLANNED INTERVENTIONS: 02835- PT Re-evaluation, 97110-Therapeutic exercises, 97530- Therapeutic activity, 97112- Neuromuscular re-education, 97535- Self Care, 02859- Manual therapy, [phone]- Gait training, [phone]- Canalith repositioning, [phone] (1-2 muscles), 20561 (3+ muscles)- Dry Needling, Patient/Family education, Balance training, Stair training, Taping, Joint mobilization, Spinal mobilization, Vestibular training, Cryotherapy, and Moist heat  PLAN FOR NEXT SESSION: DC next session; Continue working on cervical ROM (may benefit from manual therapy additional and progressing self-mobilization strategies), shoulder ER  strength, and balance with EC and head turns    Louana Terrilyn Christians, [place], DPT 10/05/2024 10:56 AM  [facility] at [facility] [address], [address] [place], [place] [address] Phone # [phone] Fax # [phone]     "

## 2024-10-04 NOTE — Patient Instructions (Addendum)
 Kelly George,  Thank you for taking the time for your Medicare Wellness Visit. I appreciate your continued commitment to your health goals. Please review the care plan we discussed, and feel free to reach out if I can assist you further.  Please note that Annual Wellness Visits do not include a physical exam. Some assessments may be limited, especially if the visit was conducted virtually. If needed, we may recommend an in-person follow-up with your provider.  Ongoing Care Seeing your primary care provider every 3 to 6 months helps us  monitor your health and provide consistent, personalized care.    Referrals If a referral was made during today's visit and you haven't received any updates within two weeks, please contact the referred provider directly to check on the status.  Recommended Screenings:  Health Maintenance  Topic Date Due   DTaP/Tdap/Td vaccine (2 - Td or Tdap) 08/05/2021   COVID-19 Vaccine (5 - 2025-26 season) 06/14/2024   Breast Cancer Screening  02/05/2025   Osteoporosis screening with Bone Density Scan  09/28/2025   Medicare Annual Wellness Visit  10/04/2025   Pneumococcal Vaccine for age over 88  Completed   Flu Shot  Completed   Zoster (Shingles) Vaccine  Completed   Meningitis B Vaccine  Aged Out   Colon Cancer Screening  Discontinued       10/04/2024    1:49 PM  Advanced Directives  Does Patient Have a Medical Advance Directive? Yes  Type of Estate Agent of [place];Living will  Does patient want to make changes to medical advance directive? No - Patient declined  Copy of Healthcare Power of Attorney in Chart? No - copy requested    Vision: Annual vision screenings are recommended for early detection of glaucoma, cataracts, and diabetic retinopathy. These exams can also reveal signs of chronic conditions such as diabetes and high blood pressure.  Dental: Annual dental screenings help detect early signs of oral cancer, gum disease, and  other conditions linked to overall health, including heart disease and diabetes.  Please see the attached documents for additional preventive care recommendations.

## 2024-10-04 NOTE — Progress Notes (Signed)
 "  Chief Complaint  Patient presents with   Medicare Wellness     Subjective:   Kelly George is a 78 y.o. female who presents for a Medicare Annual Wellness Visit.  Visit info / Clinical Intake: Medicare Wellness Visit Type:: Subsequent Annual Wellness Visit Persons participating in visit and providing information:: patient Medicare Wellness Visit Mode:: In-person (required for WTM) Interpreter Needed?: No Pre-visit prep was completed: yes AWV questionnaire completed by patient prior to visit?: yes Date:: 10/01/24 Living arrangements:: in retirement community Patient's Overall Health Status Rating: good Typical amount of pain: some (Followed by medical attention) Does pain affect daily life?: (!) yes Are you currently prescribed opioids?: no  Dietary Habits and Nutritional Risks How many meals a day?: 3 Eats fruit and vegetables daily?: yes Most meals are obtained by: having others provide food In the last 2 weeks, have you had any of the following?: none Diabetic:: no  Functional Status Activities of Daily Living (to include ambulation/medication): Independent Ambulation: Independent with device- listed below Home Assistive Devices/Equipment: Eyeglasses Medication Administration: Independent Home Management (perform basic housework or laundry): Needs assistance (comment) (Aide assist) Manage your own finances?: yes Primary transportation is: driving Concerns about vision?: no *vision screening is required for WTM* Concerns about hearing?: no  Fall Screening Falls in the past year?: 1 Number of falls in past year: 0 Was there an injury with Fall?: 0 Fall Risk Category Calculator: 1 Patient Fall Risk Level: Low Fall Risk  Fall Risk Patient at Risk for Falls Due to: Other (Comment) (Lost Balance tripped over chair) Fall risk Follow up: Falls evaluation completed; Education provided  Home and Transportation Safety: All rugs have non-skid backing?: N/A, no  rugs All stairs or steps have railings?: N/A, no stairs Grab bars in the bathtub or shower?: yes Have non-skid surface in bathtub or shower?: yes Good home lighting?: yes Regular seat belt use?: yes Hospital stays in the last year:: no  Cognitive Assessment Difficulty concentrating, remembering, or making decisions? : yes Will 6CIT or Mini Cog be Completed: yes What year is it?: 0 points What month is it?: 0 points Give patient an address phrase to remember (5 components): [address] [place]  About what time is it?: 0 points Count backwards from 20 to 1: 0 points Say the months of the year in reverse: 0 points Repeat the address phrase from earlier: 0 points 6 CIT Score: 0 points  Advance Directives (For Healthcare) Does Patient Have a Medical Advance Directive?: Yes Does patient want to make changes to medical advance directive?: No - Patient declined Type of Advance Directive: Healthcare Power of [place]; Living will Copy of Healthcare Power of Attorney in Chart?: No - copy requested Copy of Living Will in Chart?: No - copy requested  Reviewed/Updated  Reviewed/Updated: Reviewed All (Medical, Surgical, Family, Medications, Allergies, Care Teams, Patient Goals)    Allergies (verified) Penicillins and Tetracyclines & related   Current Medications (verified) Outpatient Encounter Medications as of 10/04/2024  Medication Sig   anastrozole  (ARIMIDEX ) 1 MG tablet Take 1 tablet (1 mg total) by mouth daily.   Ascorbic Acid (VITAMIN C) 1000 MG tablet Take 1,000 mg by mouth daily.   CALCIUM CARBONATE-VITAMIN D  PO Take 2 tablets by mouth daily.   Cholecalciferol (VITAMIN D3) 2000 UNITS TABS Take 5,000 Units by mouth daily.    escitalopram  (LEXAPRO ) 10 MG tablet TAKE 1 TABLET BY MOUTH EVERY DAY   ibuprofen (ADVIL) 200 MG tablet Take 200 mg by mouth every  6 (six) hours as needed. 2 tabs daily prn   losartan  (COZAAR ) 50 MG tablet Take 1 tablet (50 mg total) by mouth daily.    Omega-3 Fatty Acids (EQL OMEGA 3 FISH OIL) 1000 MG CPDR Take 2 capsules by mouth daily.   simvastatin  (ZOCOR ) 10 MG tablet TAKE 1 TABLET BY MOUTH EVERY DAY AT NIGHT   No facility-administered encounter medications on file as of 10/04/2024.    History: Past Medical History:  Diagnosis Date   Anxiety    Depression    Heart murmur    Hyperlipidemia    RBBB    Seasonal allergies    Past Surgical History:  Procedure Laterality Date   BREAST BIOPSY Right 10/2020   x2   BREAST EXCISIONAL BIOPSY Left    COLONOSCOPY  07/07/2001   MASTECTOMY Right 11/2020   MASTECTOMY W/ SENTINEL NODE BIOPSY Right 11/28/2020   Procedure: RIGHT MASTECTOMY WITH RIGHT AXILLARY SENTINEL LYMPH NODE BIOPSY;  Surgeon: Ebbie Cough, MD;  Location: [facility];  Service: General;  Laterality: Right;  RNFA, PEC BLOCK   SKIN SURGERY     DERM - nasal    TOOTH EXTRACTION     TUBAL LIGATION     Family History  Problem Relation Age of Onset   Breast cancer Mother 78   COPD Mother    Diabetes Father    Cancer Father        Brain   Heart attack Father 59   Alcohol abuse Father    Depression Father    Uterine cancer Maternal Aunt    Hearing loss Maternal Grandmother    Hypertension Maternal Grandfather    Kidney disease Maternal Grandfather    Stroke Maternal Grandfather    High Cholesterol Brother    Hypertension Brother    Hyperlipidemia Brother    Autoimmune disease Daughter    Ankylosing spondylitis Daughter    Healthy Son    Depression Son    Social History   Occupational History   Occupation: Retired  Tobacco Use   Smoking status: Never    Passive exposure: Past   Smokeless tobacco: Never  Vaping Use   Vaping status: Never Used  Substance and Sexual Activity   Alcohol use: Yes    Comment: occasionally   Drug use: No   Sexual activity: Not Currently   Tobacco Counseling Counseling given: No  SDOH Screenings   Food Insecurity: No Food Insecurity (10/04/2024)   Housing: Unknown (10/04/2024)  Transportation Needs: No Transportation Needs (10/04/2024)  Utilities: Not At Risk (10/04/2024)  Alcohol Screen: Low Risk (09/13/2024)  Depression (PHQ2-9): Low Risk (10/04/2024)  Recent Concern: Depression (PHQ2-9) - High Risk (07/16/2024)  Financial Resource Strain: Low Risk (09/13/2024)  Physical Activity: Insufficiently Active (10/04/2024)  Social Connections: Socially Isolated (10/04/2024)  Stress: No Stress Concern Present (10/04/2024)  Recent Concern: Stress - Stress Concern Present (07/12/2024)  Tobacco Use: Low Risk (10/04/2024)  Health Literacy: Adequate Health Literacy (10/04/2024)   See flowsheets for full screening details  Depression Screen PHQ 2 & 9 Depression Scale- Over the past 2 weeks, how often have you been bothered by any of the following problems? Little interest or pleasure in doing things: 0 Feeling down, depressed, or hopeless (PHQ Adolescent also includes...irritable): 0 PHQ-2 Total Score: 0 Trouble falling or staying asleep, or sleeping too much: 3 Feeling tired or having little energy: 3 Poor appetite or overeating (PHQ Adolescent also includes...weight loss): 0 Feeling bad about yourself - or that you are a failure  or have let yourself or your family down: 1 Trouble concentrating on things, such as reading the newspaper or watching television (PHQ Adolescent also includes...like school work): 0 Moving or speaking so slowly that other people could have noticed. Or the opposite - being so fidgety or restless that you have been moving around a lot more than usual: 0 Thoughts that you would be better off dead, or of hurting yourself in some way: 0 PHQ-9 Total Score: 11 If you checked off any problems, how difficult have these problems made it for you to do your work, take care of things at home, or get along with other people?: Somewhat difficult     Goals Addressed               This Visit's Progress     Continue physical  activity (pt-stated)        Remain active.             Objective:    Today's Vitals   10/04/24 1342  BP: 130/62  Pulse: 83  Temp: 98.2 F (36.8 C)  TempSrc: Oral  SpO2: 96%  Weight: 189 lb (85.7 kg)  Height: 5' 8 (1.727 m)   Body mass index is 28.74 kg/m.  Hearing/Vision screen Hearing Screening - Comments:: Denies hearing difficulties   Vision Screening - Comments:: Wears rx glasses - up to date with routine eye exams with  Dr Marcey Immunizations and Health Maintenance Health Maintenance  Topic Date Due   DTaP/Tdap/Td (2 - Td or Tdap) 08/05/2021   COVID-19 Vaccine (5 - 2025-26 season) 06/14/2024   Mammogram  02/05/2025   Bone Density Scan  09/28/2025   Medicare Annual Wellness (AWV)  10/04/2025   Pneumococcal Vaccine: 50+ Years  Completed   Influenza Vaccine  Completed   Zoster Vaccines- Shingrix  Completed   Meningococcal B Vaccine  Aged Out   Colonoscopy  Discontinued        Assessment/Plan:  This is a routine wellness examination for Caroline.  Patient Care Team: Micheal Wolm ORN, MD as PCP - General (Family Medicine) Estelle Service, MD (Obstetrics and Gynecology) Skeet Juliene SAUNDERS, DO as Consulting Physician (Neurology) Odean Potts, MD as Consulting Physician (Hematology and Oncology) Ebbie Cough, MD as Consulting Physician (General Surgery) Skeet Juliene SAUNDERS, DO as Consulting Physician (Neurology)  I have personally reviewed and noted the following in the patients chart:   Medical and social history Use of alcohol, tobacco or illicit drugs  Current medications and supplements including opioid prescriptions. Functional ability and status Nutritional status Physical activity Advanced directives List of other physicians Hospitalizations, surgeries, and ER visits in previous 12 months Vitals Screenings to include cognitive, depression, and falls Referrals and appointments  No orders of the defined types were placed in this  encounter.  In addition, I have reviewed and discussed with patient certain preventive protocols, quality metrics, and best practice recommendations. A written personalized care plan for preventive services as well as general preventive health recommendations were provided to patient.   Rojelio ORN Blush, LPN   [id number]   Return in 53 weeks (on 10/10/2025).  After Visit Summary: (In Person-Declined) Patient declined AVS at this time.  Nurse Notes: No voiced or noted concerns at this time "

## 2024-10-05 ENCOUNTER — Ambulatory Visit: Payer: Medicare HMO | Admitting: Hematology and Oncology

## 2024-10-05 ENCOUNTER — Ambulatory Visit: Admitting: Physical Therapy

## 2024-10-05 ENCOUNTER — Encounter: Payer: Self-pay | Admitting: Physical Therapy

## 2024-10-05 DIAGNOSIS — Z9181 History of falling: Secondary | ICD-10-CM

## 2024-10-05 DIAGNOSIS — M542 Cervicalgia: Secondary | ICD-10-CM

## 2024-10-05 DIAGNOSIS — R2689 Other abnormalities of gait and mobility: Secondary | ICD-10-CM

## 2024-10-11 ENCOUNTER — Encounter: Payer: Self-pay | Admitting: Pulmonary Disease

## 2024-10-11 ENCOUNTER — Ambulatory Visit: Admitting: Pulmonary Disease

## 2024-10-11 VITALS — BP 140/72 | HR 81 | Temp 98.7°F | Ht 68.0 in | Wt 180.2 lb

## 2024-10-11 DIAGNOSIS — R0683 Snoring: Secondary | ICD-10-CM

## 2024-10-11 NOTE — Patient Instructions (Signed)
" °  You will receive a call to schedule your Sleep Study.  ----------   Please schedule a follow up appointment with me when you check out today.  ----------   "

## 2024-10-11 NOTE — Progress Notes (Signed)
 "  Synopsis: Referred in 09/2024 for fatigue by Micheal Wolm ORN, MD  Subjective:   PATIENT ID: Kelly George GENDER: female DOB: 12/25/1945, MRN: [id number]  Chief Complaint  Patient presents with   Consult    Pt states she was was referred for sleep apnea, stated she snores alot     HPI  Kelly George is a 78 y.o. patient with history of HFpEF (grade II diastolic dysfunction), breast cancer on Arimidex , GERD, recurrent depression and anxiety who presents today for evaluation of loud snoring.  The patient reports she recently moved and her new neighbor reports that she can hear the patient snoring from her apartment next door. She reports she has also been told by her daughter that she snores loudly.  Sleep habits: - Bedtime: 9:30-10:30 pm - Sleep latency: 15 - 60 mins - Number of awakenings per night: 2-3x/night (2x nocturia) - Wake time: 7:30-8 am - Waking headaches: No - EDS symptoms: Yes - takes intentional naps; denies unintentional lapses into sleep. - Sleep aids: None - Stimulant use: None - and rarely drinks coffee. - H/o MVAs / drowsy driving: Denies.  Comorbidities: - Hypertension: Yes - Diabetes: No - Obesity: No, BMI 27-28.  Other: - GLP-1 use: No  Pulm Questionnaires:      No data to display           Past Medical History:  Diagnosis Date   Anxiety    Arthritis    Breast cancer (HCC) Jan. 2022   Depression    Heart murmur 1980   Hyperlipidemia    RBBB    Seasonal allergies    Skin cancer 2000     Family History  Problem Relation Age of Onset   Breast cancer Mother 55   COPD Mother    Diabetes Father    Cancer Father        Brain   Heart attack Father 76   Alcohol abuse Father    Depression Father    Hearing loss Father    Heart disease Father    Uterine cancer Maternal Aunt    Hearing loss Maternal Grandmother    Hypertension Maternal Grandfather    Kidney disease Maternal Grandfather    Stroke Maternal Grandfather     High Cholesterol Brother    Hypertension Brother    Hyperlipidemia Brother    ADD / ADHD Brother    Autoimmune disease Daughter    Ankylosing spondylitis Daughter    Depression Son      Past Surgical History:  Procedure Laterality Date   BREAST BIOPSY Right 10/2020   x2   BREAST EXCISIONAL BIOPSY Left    COLONOSCOPY  07/07/2001   MASTECTOMY Right 11/2020   MASTECTOMY W/ SENTINEL NODE BIOPSY Right 11/28/2020   Procedure: RIGHT MASTECTOMY WITH RIGHT AXILLARY SENTINEL LYMPH NODE BIOPSY;  Surgeon: Ebbie Cough, MD;  Location: [facility];  Service: General;  Laterality: Right;  RNFA, PEC BLOCK   SKIN SURGERY     DERM - nasal    TOOTH EXTRACTION     TUBAL LIGATION  1980    Social History   Socioeconomic History   Marital status: Divorced    Spouse name: Not on file   Number of children: 2   Years of education: Not on file   Highest education level: Some college, no degree  Occupational History   Occupation: Retired  Tobacco Use   Smoking status: Never    Passive exposure: Past   Smokeless  tobacco: Never  Vaping Use   Vaping status: Never Used  Substance and Sexual Activity   Alcohol use: Yes    Comment: occasionally   Drug use: No   Sexual activity: Not Currently  Other Topics Concern   Not on file  Social History Narrative   Live alone.     Patient is right-handed. She lives alone in a 1 story house, 3-4 steps to enter.    Social Drivers of Health   Tobacco Use: Low Risk (10/11/2024)   Patient History    Smoking Tobacco Use: Never    Smokeless Tobacco Use: Never    Passive Exposure: Past  Financial Resource Strain: Low Risk (09/13/2024)   Overall Financial Resource Strain (CARDIA)    Difficulty of Paying Living Expenses: Not hard at all  Food Insecurity: No Food Insecurity (10/04/2024)   Epic    Worried About Radiation Protection Practitioner of Food in the Last Year: Never true    Ran Out of Food in the Last Year: Never true  Transportation Needs: No  Transportation Needs (10/04/2024)   Epic    Lack of Transportation (Medical): No    Lack of Transportation (Non-Medical): No  Physical Activity: Insufficiently Active (10/04/2024)   Exercise Vital Sign    Days of Exercise per Week: 2 days    Minutes of Exercise per Session: 30 min  Stress: No Stress Concern Present (10/04/2024)   [employer] of Occupational Health - Occupational Stress Questionnaire    Feeling of Stress: Only a little  Recent Concern: Stress - Stress Concern Present (07/12/2024)   [employer] of Occupational Health - Occupational Stress Questionnaire    Feeling of Stress: Rather much  Social Connections: Socially Isolated (10/04/2024)   Social Connection and Isolation Panel    Frequency of Communication with Friends and Family: More than three times a week    Frequency of Social Gatherings with Friends and Family: More than three times a week    Attends Religious Services: Never    Database Administrator or Organizations: No    Attends Banker Meetings: Never    Marital Status: Divorced  Catering Manager Violence: Not At Risk (10/04/2024)   Epic    Fear of Current or Ex-Partner: No    Emotionally Abused: No    Physically Abused: No    Sexually Abused: No  Depression (PHQ2-9): Low Risk (10/04/2024)   Depression (PHQ2-9)    PHQ-2 Score: 0  Recent Concern: Depression (PHQ2-9) - High Risk (07/16/2024)   Depression (PHQ2-9)    PHQ-2 Score: 11  Alcohol Screen: Low Risk (09/13/2024)   Alcohol Screen    Last Alcohol Screening Score (AUDIT): 4  Housing: Unknown (10/04/2024)   Epic    Unable to Pay for Housing in the Last Year: No    Number of Times Moved in the Last Year: Not on file    Homeless in the Last Year: No  Utilities: Not At Risk (10/04/2024)   Epic    Threatened with loss of utilities: No  Health Literacy: Adequate Health Literacy (10/04/2024)   B1300 Health Literacy    Frequency of need for help with medical instructions: Never      Allergies[1]   Outpatient Medications Prior to Visit  Medication Sig Dispense Refill   anastrozole  (ARIMIDEX ) 1 MG tablet Take 1 tablet (1 mg total) by mouth daily. 90 tablet 3   Ascorbic Acid (VITAMIN C) 1000 MG tablet Take 1,000 mg by mouth daily.     CALCIUM  CARBONATE-VITAMIN D  PO Take 2 tablets by mouth daily.     Cholecalciferol (VITAMIN D3) 2000 UNITS TABS Take 5,000 Units by mouth daily.      escitalopram  (LEXAPRO ) 10 MG tablet TAKE 1 TABLET BY MOUTH EVERY DAY 90 tablet 0   ibuprofen (ADVIL) 200 MG tablet Take 200 mg by mouth every 6 (six) hours as needed. 2 tabs daily prn     losartan  (COZAAR ) 50 MG tablet Take 1 tablet (50 mg total) by mouth daily. 90 tablet 0   Omega-3 Fatty Acids (EQL OMEGA 3 FISH OIL) 1000 MG CPDR Take 2 capsules by mouth daily.     simvastatin  (ZOCOR ) 10 MG tablet TAKE 1 TABLET BY MOUTH EVERY DAY AT NIGHT 30 tablet 0   No facility-administered medications prior to visit.    ROS   Objective:  Physical Exam Constitutional:      Appearance: Normal appearance.     Comments: Mildly overweight. Well-appearing.  HENT:     Head: Normocephalic and atraumatic.     Mouth/Throat:     Mouth: Mucous membranes are moist.     Pharynx: Oropharynx is clear. No oropharyngeal exudate or posterior oropharyngeal erythema.     Comments: MP III. No tongue scalloping. Eyes:     General: No scleral icterus.    Conjunctiva/sclera: Conjunctivae normal.  Pulmonary:     Effort: Pulmonary effort is normal.  Musculoskeletal:     Right lower leg: No edema.     Left lower leg: No edema.  Neurological:     Mental Status: She is alert and oriented to person, place, and time.  Psychiatric:        Mood and Affect: Mood normal.        Behavior: Behavior normal.        Thought Content: Thought content normal.      Vitals:   10/11/24 1341  BP: (!) 140/72  Pulse: 81  Temp: 98.7 F (37.1 C)  TempSrc: Oral  SpO2: 96%  Weight: 180 lb 3.2 oz (81.7 kg)  Height: 5' 8  (1.727 m)   96% on RA BMI Readings from Last 3 Encounters:  10/11/24 27.40 kg/m  10/04/24 28.74 kg/m  09/15/24 27.03 kg/m   Wt Readings from Last 3 Encounters:  10/11/24 180 lb 3.2 oz (81.7 kg)  10/04/24 189 lb (85.7 kg)  09/15/24 177 lb 12.8 oz (80.6 kg)     CBC    Component Value Date/Time   WBC 4.9 07/07/2024 1518   RBC 3.93 07/07/2024 1518   HGB 12.6 07/07/2024 1518   HGB 14.6 11/18/2019 1106   HCT 37.9 07/07/2024 1518   HCT 43.4 11/18/2019 1106   PLT 224 07/07/2024 1518   PLT 262 11/18/2019 1106   MCV 96.4 07/07/2024 1518   MCV 95 11/18/2019 1106   MCH 32.1 07/07/2024 1518   MCHC 33.2 07/07/2024 1518   RDW 12.3 07/07/2024 1518   RDW 12.2 11/18/2019 1106   LYMPHSABS 1.4 07/07/2024 1518   LYMPHSABS 1.2 11/18/2019 1106   MONOABS 0.5 07/07/2024 1518   EOSABS 0.2 07/07/2024 1518   EOSABS 0.1 11/18/2019 1106   BASOSABS 0.0 07/07/2024 1518   BASOSABS 0.0 11/18/2019 1106    Chest Imaging: None pertinent  Pulmonary Functions Testing Results:     No data to display         Echocardiogram:  TTE (07/07/2015):    Normal LV size with mild focal basal septal hypertrophy. EF    55-60%. Moderate diastolic dysfunction. Normal RV  size and    systolic function. Trivial MR, no mitral valve prolapse noted.   Heart Catheterization: None  Sleep Studies: None    Assessment & Plan:     ICD-10-CM   1. Snoring  R06.83 Home sleep test      Discussion:  STOPBANG 4/8 and given her symptoms she clearly has OSA.  Ordered HST to qualify her for treatment / assess severity.  Remainder of visit spent counseling the patient on rationale for treatment of OSA, and the major treatment options. In her case, she is symptomatic with EDS, has evidence of diastolic dysfunction on echocardiogram and is requiring pharmacologic treatment for her hypertension.  She is amenable to try CPAP. We discussed use of P-10 nasal pillows mask as well as desensitization strategies upon  receiving CPAP device (e.g. using it for an hour or two while watching television on first 1-2 days after she receives it).  RTC 4 months -- ideally will have been on CPAP for about 2 months by that point in time.   Current Medications[2]   Time spent on day of this encounter (includes time spent face-to-face with the patient as well as time spent the same day as the encounter reviewing existing data and notes, and/or documenting my findings and the plan of care): 35 minutes  Lamar Dales, MD Pulmonary, Critical Care & Sleep Medicine [facility] 10/11/2024 2:13 PM      [1]  Allergies Allergen Reactions   Penicillins Rash   Tetracyclines & Related Palpitations  [2]  Current Outpatient Medications:    anastrozole  (ARIMIDEX ) 1 MG tablet, Take 1 tablet (1 mg total) by mouth daily., Disp: 90 tablet, Rfl: 3   Ascorbic Acid (VITAMIN C) 1000 MG tablet, Take 1,000 mg by mouth daily., Disp: , Rfl:    CALCIUM CARBONATE-VITAMIN D  PO, Take 2 tablets by mouth daily., Disp: , Rfl:    Cholecalciferol (VITAMIN D3) 2000 UNITS TABS, Take 5,000 Units by mouth daily. , Disp: , Rfl:    escitalopram  (LEXAPRO ) 10 MG tablet, TAKE 1 TABLET BY MOUTH EVERY DAY, Disp: 90 tablet, Rfl: 0   ibuprofen (ADVIL) 200 MG tablet, Take 200 mg by mouth every 6 (six) hours as needed. 2 tabs daily prn, Disp: , Rfl:    losartan  (COZAAR ) 50 MG tablet, Take 1 tablet (50 mg total) by mouth daily., Disp: 90 tablet, Rfl: 0   Omega-3 Fatty Acids (EQL OMEGA 3 FISH OIL) 1000 MG CPDR, Take 2 capsules by mouth daily., Disp: , Rfl:    simvastatin  (ZOCOR ) 10 MG tablet, TAKE 1 TABLET BY MOUTH EVERY DAY AT NIGHT, Disp: 30 tablet, Rfl: 0  "

## 2024-10-12 ENCOUNTER — Ambulatory Visit

## 2024-10-12 DIAGNOSIS — M542 Cervicalgia: Secondary | ICD-10-CM

## 2024-10-12 DIAGNOSIS — R2689 Other abnormalities of gait and mobility: Secondary | ICD-10-CM

## 2024-10-12 DIAGNOSIS — Z9181 History of falling: Secondary | ICD-10-CM

## 2024-10-12 NOTE — Therapy (Signed)
 " OUTPATIENT PHYSICAL THERAPY CERVICAL NOTE and D/C Summary   Patient Name: Kelly George MRN: [id number] DOB:18-Jul-1946, 78 y.o., female Today's Date: 10/12/2024  PHYSICAL THERAPY DISCHARGE SUMMARY  Visits from Start of Care: 10  Current functional level related to goals / functional outcomes: See below for measures   Remaining deficits: Cervical spine ROM limitations Some lingering dizziness/lightheadedness with certain movements/positions of head   Education / Equipment: HEP   Patient agrees to discharge. Patient goals were partially met. Patient is being discharged due to being pleased with the current functional level.   END OF SESSION:  PT End of Session - 10/12/24 1103     Visit Number 10    Number of Visits 14    Date for Recertification  10/26/24    Authorization Type Medicare/Generic Supplemental    PT Start Time 1101    PT Stop Time 1145    PT Time Calculation (min) 44 min    Activity Tolerance Patient tolerated treatment well    Behavior During Therapy [facility] for tasks assessed/performed                 Past Medical History:  Diagnosis Date   Anxiety    Arthritis    Breast cancer (HCC) Jan. 2022   Depression    Heart murmur 1980   Hyperlipidemia    RBBB    Seasonal allergies    Skin cancer 2000   Past Surgical History:  Procedure Laterality Date   BREAST BIOPSY Right 10/2020   x2   BREAST EXCISIONAL BIOPSY Left    COLONOSCOPY  07/07/2001   MASTECTOMY Right 11/2020   MASTECTOMY W/ SENTINEL NODE BIOPSY Right 11/28/2020   Procedure: RIGHT MASTECTOMY WITH RIGHT AXILLARY SENTINEL LYMPH NODE BIOPSY;  Surgeon: Ebbie Cough, MD;  Location: [facility];  Service: General;  Laterality: Right;  RNFA, PEC BLOCK   SKIN SURGERY     DERM - nasal    TOOTH EXTRACTION     TUBAL LIGATION  1980   Patient Active Problem List   Diagnosis Date Noted   Hypertension 09/15/2024   Neck pain 07/05/2024   Hematuria 07/05/2024   Closed  compression fracture of L5 vertebra (HCC) 10/17/2023   Breast cancer, right (HCC) 11/28/2020   Malignant neoplasm of upper-outer quadrant of right breast in female, estrogen receptor positive (HCC) 11/10/2020   Osteoarthritis of multiple joints 04/03/2020   Aortic atherosclerosis 12/03/2018   Dyslipidemia 12/03/2018   Precordial chest pain 07/07/2015   Pain in joint of right foot 05/16/2014   Vitamin D  deficiency 07/08/2013   Mitral valve prolapse syndrome, history of mild 01/05/2013   Seasonal allergies    Anxiety    Depression, recurrent    Hyperlipidemia     PCP: Micheal Wolm ORN, MD   REFERRING PROVIDER: Skeet Juliene SAUNDERS, DO  REFERRING DIAG: [phone] (ICD-10-CM) - Occipital neuralgia of right side  THERAPY DIAG:  Cervicalgia  History of falling  Other abnormalities of gait and mobility  Rationale for Evaluation and Treatment: Rehabilitation  ONSET DATE: 1 month  SUBJECTIVE:  SUBJECTIVE STATEMENT: I feel like there's improvement. Turning the head is better, but it still hurts when turning to the R. Dizziness and imbalance has good days and bad days but it is improving. Reports that she is taking exercise classes at [facility]. Really enjoying it.    Hand dominance: Right  PERTINENT HISTORY:  Anxiety, depression, HLD, RBBB, R mastectomy  PAIN:  Are you having pain? Yes: NPRS scale: 0/10 Pain location: neck Pain description: discomfort, stiffness Aggravating factors: turning head Relieving factors: relax, head straight, ibuprofen  PRECAUTIONS: Fall  RED FLAGS: None     WEIGHT BEARING RESTRICTIONS: No  FALLS:  Has patient fallen in last 6 months? Yes. Number of falls 3.   LIVING ENVIRONMENT: Lives with: lives alone Lives in:  House/apartment Stairs: none Has following equipment at home: Grab bars  OCCUPATION: retired  PLOF: Independent  PATIENT GOALS: improve pain and be able to turn my neck  NEXT MD VISIT: 01/20/25  OBJECTIVE:    TODAY'S TREATMENT: 10/12/24 Activity Comments  HEP review   40 deg flex/extension Right lat flex: 25 deg Left lat flex: 20 deg Rotation: 35 deg   5xSTS 20 sec Good control  Pt education Discussion regarding joint ROM vs soft tissue restriction  Progressions for static multisensory balance for HEP advancement         TODAY'S TREATMENT: 10/05/24 Activity Comments  R/L shoulder ER green TB 2x10 each side Cueing for positioning, pain-free ROM. Harder on R side  prone over pball horizontal ABD 3# 2x10  Good form. Pt reports good tolerance for this positioned thus provided modifications for positioning during her exercise classes   scaption 3# 2x10 Assist for proper positioning   STM and manual TPR, grade III cervical central Pas and R/L translation mobs, R cervical rotation MET followed by gentle PROM (performed supine and sitting) All performed to tolerance. TTP in B suboccipitals and some discomfort with R rotation           PATIENT EDUCATION: Education details: discussed POC- patient reports ready for DC 12/30 Person educated: Patient Education method: Explanation Education comprehension: verbalized understanding      HOME EXERCISE PROGRAM Access Code: [email or web address] URL: [email or web address] Date: 09/14/2024 Prepared by: [facility] - Outpatient  Rehab - [facility]  Exercises - Seated Assisted Cervical Rotation with Towel  - 1 x daily - 5 x weekly - 2 sets - 10 reps - Mid-Lower Cervical Extension SNAG with Strap  - 1 x daily - 5 x weekly - 2 sets - 10 reps - Seated Bilateral Shoulder External Rotation with Resistance  - 1 x daily - 5 x weekly - 2 sets - 10 reps - Seated Shoulder Horizontal Abduction with Resistance  - 1 x daily - 5 x weekly -  2 sets - 10 reps - Standing with Head Nod  - 1 x daily - 5 x weekly - 3 sets - 30 sec hold - Seated Nose to Left Knee Vestibular Habituation  - 1 x daily - 5 x weekly - 2 sets - 3-5 reps - Seated Nose to Right Knee Vestibular Habituation  - 1 x daily - 5 x weekly - 2 sets - 3-5 reps - Corner Balance Feet Together: Eyes Closed With Head Turns  - 1 x daily - 5 x weekly - 3 sets - 30 sec hold       Note: Objective measures were completed at Evaluation unless otherwise noted.  DIAGNOSTIC FINDINGS:  07/07/24 head neck CT angio:  No evidence of acute intracranial abnormality. 2. Moderate chronic small vessel ischemic disease. 3. Mild atherosclerosis in the head and neck without a large vessel occlusion. 4. Severe left P2 stenosis.   PATIENT SURVEYS:  NDI:13/50, 26%  COGNITION: Overall cognitive status: Within functional limits for tasks assessed  SENSATION: Pt reports occasional N/T in fingertips while driving  POSTURE: shoulders elevated and neck slightly flexed with reduced lordosis   PALPATION: TTP over B scalenes and L UT; soft tissue restriction throughout cervical musculature    CERVICAL ROM:   Active ROM AROM (deg) eval A/ROM 09/07/24  Flexion 39 40  Extension 25 *c/o pain 20  Right lateral flexion 15 20  Left lateral flexion 14 20  Right rotation 27 *c/o tight 36  Left rotation 22 30   (Blank rows = not tested)   UPPER EXTREMITY ROM:  Active ROM Right eval Left eval  Shoulder flexion [facility]  Shoulder extension    Shoulder abduction    Shoulder adduction    Shoulder extension    Shoulder internal rotation ~ T10 ~ T10  Shoulder external rotation Back of head Back of head  Elbow flexion    Elbow extension    Wrist flexion    Wrist extension    Wrist ulnar deviation    Wrist radial deviation    Wrist pronation    Wrist supination     (Blank rows = not tested)  UPPER EXTREMITY MMT:  MMT Right eval Left eval  Shoulder flexion 4+ 4  Shoulder  extension    Shoulder abduction 4- 4-  Shoulder adduction    Shoulder extension    Shoulder internal rotation 4- 4-  Shoulder external rotation 4- 4-  Middle trapezius    Lower trapezius    Elbow flexion    Elbow extension    Wrist flexion    Wrist extension    Wrist ulnar deviation    Wrist radial deviation    Wrist pronation    Wrist supination    Grip strength     (Blank rows = not tested)   Gait: Slightly unsteady and antalgic on R LE  TREATMENT DATE: 08/04/24                                                                                                                                 PATIENT EDUCATION:  Education details: prognosis, POC, HEP, edu on benefits of balance training to avoid falls and injury Person educated: Patient Education method: Programmer, Multimedia, Demonstration, Tactile cues, Verbal cues, and Handouts Education comprehension: verbalized understanding and returned demonstration  HOME EXERCISE PROGRAM: Access Code: [email or web address] URL: [email or web address] Date: 08/04/2024 Prepared by: [facility] - Outpatient  Rehab - [facility]  Exercises - Seated Assisted Cervical Rotation with Towel  - 1 x daily - 5 x weekly - 2 sets - 10 reps - Mid-Lower Cervical Extension SNAG with Strap  - 1 x daily - 5 x weekly -  2 sets - 10 reps - Seated Bilateral Shoulder External Rotation with Resistance  - 1 x daily - 5 x weekly - 2 sets - 10 reps - Seated Shoulder Horizontal Abduction with Resistance  - 1 x daily - 5 x weekly - 2 sets - 10 reps   ASSESSMENT:  CLINICAL IMPRESSION: Notes neck pain has abated and chief difficulty is limited ROM. HEP review with great recall and only occasional cues for set-up or sequence with proper return demo thereafter.  ROM reveals improved cervical spine extension from baseline 20 degrees to 40 degrees with all other remaining similar to baseline/previous reading but without pain at end-range presently.  Notes left cervical  column is most restricted with movements such as left lateral flexion and left rotation.  Demo improved BLE strength w/ 5/5 to seated resisted tests and 5xSTS test 20 sec indicating high risk for falls per time but maintained excellent control, just lacking in speed.  Notes dizziness/lightheadedness has improved only experiencing when head is lower than waist line such as bending over to clean and notes some provocation to Nose-to-knee with subsequent repetitions.  Pt feels well-equipped for self-mgmt at this time and intends to continue HEP as well as additional fitness class. OBJECTIVE IMPAIRMENTS: Abnormal gait, decreased activity tolerance, decreased balance, difficulty walking, decreased ROM, decreased strength, increased muscle spasms, impaired flexibility, postural dysfunction, and pain.   ACTIVITY LIMITATIONS: carrying, lifting, bending, standing, squatting, sleeping, stairs, transfers, bathing, reach over head, hygiene/grooming, and locomotion level  PARTICIPATION LIMITATIONS: meal prep, cleaning, laundry, driving, shopping, community activity, and riding horse  PERSONAL FACTORS: Age, Past/current experiences, Time since onset of injury/illness/exacerbation, and 3+ comorbidities: Anxiety, depression, HLD, RBBB, R mastectomy are also affecting patient's functional outcome.   REHAB POTENTIAL: Good  CLINICAL DECISION MAKING: Evolving/moderate complexity  EVALUATION COMPLEXITY: Moderate   GOALS: Goals reviewed with patient? Yes  SHORT TERM GOALS: Target date: 08/25/2024  Patient to be independent with initial HEP. Baseline: HEP initiated Goal status: MET     LONG TERM GOALS: Target date: 10/26/2024  Patient to be independent with advanced HEP. Baseline: Not yet initiated  Goal status: MET  Patient to demonstrate B LE strength >/=4+/5.  Baseline: See above; improved but not met; 5/5 Goal status: MET   Patient to demonstrate cervical AROM improved by 10 degrees in all planes.   Baseline: improved but not quite met; extension improved 20 deg Goal status: Partially met  Patient to report resolution of neck pain. Baseline: has pain; pt reports pain back to baseline 09/14/24 Goal status: MET  09/14/24  Patient to score at least 23/30 on FGA in order to decrease risk of falls.  Baseline: 21/30; 22/30 Goal status: NOT MET  Patient to complete 5xSTS in <15 sec with LRAD in order to decrease risk of falls.   Baseline: 21 sec; 20.21 sec; 20 sec Goal status: NOT MET 09/14/24   Patient to report 75% improvement in dizziness and unsteadiness with bending tasks.  Baseline: 40% or more Goal status: NOT MET    PLAN:  PT FREQUENCY: 1x/week  PT DURATION: 6 weeks  PLANNED INTERVENTIONS: 97164- PT Re-evaluation, 97110-Therapeutic exercises, 97530- Therapeutic activity, [id number]- Neuromuscular re-education, 97535- Self Care, [id number]- Manual therapy, [id number]- Gait training, [phone]- Canalith repositioning, [phone] (1-2 muscles), [id number] (3+ muscles)- Dry Needling, Patient/Family education, Balance training, Stair training, Taping, Joint mobilization, Spinal mobilization, Vestibular training, Cryotherapy, and Moist heat  PLAN FOR NEXT SESSION: DC  11:38 AM, 10/12/2024 M. Kelly Maurine Mowbray, PT, DPT Physical Therapist- [place] Office  Number: [phone]      "

## 2024-10-22 ENCOUNTER — Other Ambulatory Visit: Payer: Self-pay | Admitting: Family Medicine

## 2024-10-22 DIAGNOSIS — E7800 Pure hypercholesterolemia, unspecified: Secondary | ICD-10-CM

## 2024-11-02 ENCOUNTER — Other Ambulatory Visit: Payer: Self-pay | Admitting: Family Medicine

## 2024-11-10 ENCOUNTER — Other Ambulatory Visit: Payer: Self-pay | Admitting: Hematology and Oncology

## 2024-11-11 ENCOUNTER — Ambulatory Visit: Admitting: Neurology

## 2024-11-19 ENCOUNTER — Telehealth: Payer: Self-pay | Admitting: Pulmonary Disease

## 2024-11-19 DIAGNOSIS — G4733 Obstructive sleep apnea (adult) (pediatric): Secondary | ICD-10-CM

## 2024-11-19 NOTE — Telephone Encounter (Signed)
 Called and spoke to pt. Advised of HST results per Dr. Olena. Pt verbalized understanding and states that she would not like to proceed with CPAP therapy at this time as she has been sleeping better lately. I advised patient to call back if she changes her mind. NFN.

## 2024-11-19 NOTE — Telephone Encounter (Signed)
 Please inform patient their HST shows moderate sleep apnea. Recommend CPAP 5-15 cm H2O with P-10 nasal pillows mask + chin strap. Needs a follow up appt with me in 3 months.  Kelly JINNY Dales, MD

## 2025-01-04 ENCOUNTER — Ambulatory Visit: Admitting: Hematology and Oncology

## 2025-01-20 ENCOUNTER — Ambulatory Visit: Admitting: Neurology

## 2025-02-03 ENCOUNTER — Ambulatory Visit: Admitting: Pulmonary Disease

## 2025-03-23 ENCOUNTER — Ambulatory Visit: Admitting: Family Medicine

## 2025-10-10 ENCOUNTER — Ambulatory Visit
